# Patient Record
Sex: Male | Born: 1970
Health system: Southern US, Community
[De-identification: ages and names within clinical notes are randomized; demographics above are authoritative.]

## PROBLEM LIST (undated history)

## (undated) DIAGNOSIS — J45909 Unspecified asthma, uncomplicated: Secondary | ICD-10-CM

## (undated) DIAGNOSIS — H9190 Unspecified hearing loss, unspecified ear: Secondary | ICD-10-CM

## (undated) DIAGNOSIS — E119 Type 2 diabetes mellitus without complications: Secondary | ICD-10-CM

## (undated) DIAGNOSIS — G5600 Carpal tunnel syndrome, unspecified upper limb: Secondary | ICD-10-CM

## (undated) DIAGNOSIS — E559 Vitamin D deficiency, unspecified: Secondary | ICD-10-CM

## (undated) DIAGNOSIS — M542 Cervicalgia: Secondary | ICD-10-CM

## (undated) DIAGNOSIS — E785 Hyperlipidemia, unspecified: Secondary | ICD-10-CM

## (undated) DIAGNOSIS — S7292XA Unspecified fracture of left femur, initial encounter for closed fracture: Secondary | ICD-10-CM

## (undated) DIAGNOSIS — J309 Allergic rhinitis, unspecified: Secondary | ICD-10-CM

## (undated) DIAGNOSIS — E669 Obesity, unspecified: Secondary | ICD-10-CM

## (undated) DIAGNOSIS — R29898 Other symptoms and signs involving the musculoskeletal system: Secondary | ICD-10-CM

## (undated) DIAGNOSIS — E8881 Metabolic syndrome: Secondary | ICD-10-CM

## (undated) DIAGNOSIS — H918X9 Other specified hearing loss, unspecified ear: Secondary | ICD-10-CM

## (undated) DIAGNOSIS — M25519 Pain in unspecified shoulder: Secondary | ICD-10-CM

## (undated) HISTORY — DX: Type 2 diabetes mellitus without complications: E11.9

## (undated) HISTORY — DX: Other symptoms and signs involving the musculoskeletal system: R29.898

## (undated) HISTORY — DX: Other specified hearing loss, unspecified ear: H91.8X9

## (undated) HISTORY — DX: Unspecified hearing loss, unspecified ear: H91.90

## (undated) HISTORY — DX: Hyperlipidemia, unspecified: E78.5

## (undated) HISTORY — DX: Allergic rhinitis, unspecified: J30.9

## (undated) HISTORY — PX: BACK SURGERY: SHX140

## (undated) HISTORY — DX: Pain in unspecified shoulder: M25.519

## (undated) HISTORY — DX: Cervicalgia: M54.2

## (undated) HISTORY — DX: Unspecified asthma, uncomplicated: J45.909

## (undated) HISTORY — DX: Carpal tunnel syndrome, unspecified upper limb: G56.00

---

## 2000-04-27 HISTORY — PX: OTHER SURGICAL HISTORY: SHX169

## 2001-08-23 ENCOUNTER — Encounter: Admission: RE | Admit: 2001-08-23 | Discharge: 2001-08-23 | Payer: Self-pay | Admitting: Neurosurgery

## 2001-08-23 ENCOUNTER — Encounter: Payer: Self-pay | Admitting: Neurosurgery

## 2001-09-13 ENCOUNTER — Encounter: Payer: Self-pay | Admitting: Neurosurgery

## 2001-09-13 ENCOUNTER — Inpatient Hospital Stay (HOSPITAL_COMMUNITY): Admission: RE | Admit: 2001-09-13 | Discharge: 2001-09-15 | Payer: Self-pay | Admitting: Neurosurgery

## 2004-07-25 ENCOUNTER — Ambulatory Visit: Payer: Self-pay | Admitting: Internal Medicine

## 2004-08-01 ENCOUNTER — Ambulatory Visit: Payer: Self-pay | Admitting: Internal Medicine

## 2006-08-20 ENCOUNTER — Ambulatory Visit: Payer: Self-pay | Admitting: Internal Medicine

## 2006-08-20 LAB — CONVERTED CEMR LAB
ALT: 60 units/L — ABNORMAL HIGH (ref 0–53)
AST: 44 units/L — ABNORMAL HIGH (ref 0–37)
Albumin: 4.8 g/dL (ref 3.5–5.2)
Alkaline Phosphatase: 70 units/L (ref 39–117)
BUN: 10 mg/dL (ref 6–23)
Basophils Absolute: 0.1 10*3/uL (ref 0.0–0.1)
Basophils Relative: 1 % (ref 0–1)
Bilirubin, Direct: 0.1 mg/dL (ref 0.0–0.3)
CO2: 15 meq/L — ABNORMAL LOW (ref 19–32)
Calcium: 9.5 mg/dL (ref 8.4–10.5)
Chloride: 95 meq/L — ABNORMAL LOW (ref 96–112)
Cholesterol: 456 mg/dL — ABNORMAL HIGH (ref 0–200)
Creatinine, Ser: 1.62 mg/dL — ABNORMAL HIGH (ref 0.40–1.50)
Creatinine,U: 78.7 mg/dL
Eosinophils Absolute: 0.1 10*3/uL (ref 0.0–0.7)
Eosinophils Relative: 2 % (ref 0–5)
Glucose, Bld: 277 mg/dL — ABNORMAL HIGH (ref 70–99)
HCT: 42.4 % (ref 39.0–52.0)
HDL: 19 mg/dL — ABNORMAL LOW (ref >39)
Hemoglobin: 15.2 g/dL (ref 13.0–17.0)
Hgb A1c MFr Bld: 10.4 % — ABNORMAL HIGH (ref 4.6–6.1)
Indirect Bilirubin: 0.6 mg/dL (ref 0.0–0.9)
Lymphocytes Relative: 39 % (ref 12–46)
Lymphs Abs: 1.9 10*3/uL (ref 0.7–3.3)
MCHC: 35.8 g/dL (ref 30.0–36.0)
MCV: 88.1 fL (ref 78.0–100.0)
Microalb Creat Ratio: 54.6 mg/g — ABNORMAL HIGH (ref 0.0–30.0)
Microalb, Ur: 4.3 mg/dL — ABNORMAL HIGH (ref 0.0–1.9)
Monocytes Absolute: 0.6 10*3/uL (ref 0.2–0.7)
Monocytes Relative: 11 % (ref 3–11)
Neutro Abs: 2.3 10*3/uL (ref 1.7–7.7)
Neutrophils Relative %: 47 % (ref 43–77)
Platelets: 602 10*3/uL — ABNORMAL HIGH (ref 150–400)
Potassium: 4.3 meq/L (ref 3.5–5.3)
RBC: 4.81 M/uL (ref 4.22–5.81)
RDW: 19.4 % — ABNORMAL HIGH (ref 11.5–14.0)
Sodium: 132 meq/L — ABNORMAL LOW (ref 135–145)
TSH: 2.87 microintl units/mL (ref 0.35–5.50)
Total Bilirubin: 0.7 mg/dL (ref 0.3–1.2)
Total CHOL/HDL Ratio: 24
Total Protein: 8 g/dL (ref 6.0–8.3)
Triglycerides: 3582 mg/dL — ABNORMAL HIGH (ref <150)
WBC: 5 10*3/uL (ref 4.0–10.5)

## 2006-09-29 ENCOUNTER — Ambulatory Visit: Payer: Self-pay | Admitting: Internal Medicine

## 2006-09-29 LAB — CONVERTED CEMR LAB
BUN: 10 mg/dL (ref 6–23)
CO2: 30 meq/L (ref 19–32)
Calcium: 9.1 mg/dL (ref 8.4–10.5)
Chloride: 104 meq/L (ref 96–112)
Cholesterol: 232 mg/dL (ref 0–200)
Creatinine, Ser: 0.9 mg/dL (ref 0.4–1.5)
Direct LDL: 120.9 mg/dL
GFR calc Af Amer: 123 mL/min
GFR calc non Af Amer: 102 mL/min
Glucose, Bld: 147 mg/dL — ABNORMAL HIGH (ref 70–99)
HDL: 30.6 mg/dL — ABNORMAL LOW (ref >39.0)
Hgb A1c MFr Bld: 9.7 % — ABNORMAL HIGH (ref 4.6–6.0)
Potassium: 4 meq/L (ref 3.5–5.1)
Sodium: 140 meq/L (ref 135–145)
Total CHOL/HDL Ratio: 7.6
Triglycerides: 320 mg/dL (ref 0–149)
VLDL: 64 mg/dL — ABNORMAL HIGH (ref 0–40)

## 2006-10-18 ENCOUNTER — Encounter: Admission: RE | Admit: 2006-10-18 | Discharge: 2007-01-16 | Payer: Self-pay | Admitting: Physician Assistant

## 2007-11-21 ENCOUNTER — Ambulatory Visit: Payer: Self-pay | Admitting: Internal Medicine

## 2007-11-21 LAB — CONVERTED CEMR LAB
BUN: 9 mg/dL (ref 6–23)
CO2: 29 meq/L (ref 19–32)
Calcium: 9.3 mg/dL (ref 8.4–10.5)
Chloride: 103 meq/L (ref 96–112)
Cholesterol: 150 mg/dL (ref 0–200)
Creatinine, Ser: 0.9 mg/dL (ref 0.4–1.5)
GFR calc Af Amer: 122 mL/min
GFR calc non Af Amer: 101 mL/min
Glucose, Bld: 108 mg/dL — ABNORMAL HIGH (ref 70–99)
HDL: 27.9 mg/dL — ABNORMAL LOW (ref >39.0)
Hgb A1c MFr Bld: 6.1 % — ABNORMAL HIGH (ref 4.6–6.0)
LDL Cholesterol: 101 mg/dL — ABNORMAL HIGH (ref 0–99)
Potassium: 4.4 meq/L (ref 3.5–5.1)
Sodium: 140 meq/L (ref 135–145)
Total CHOL/HDL Ratio: 5.4
Triglycerides: 107 mg/dL (ref 0–149)
VLDL: 21 mg/dL (ref 0–40)

## 2007-11-25 ENCOUNTER — Encounter (INDEPENDENT_AMBULATORY_CARE_PROVIDER_SITE_OTHER): Payer: Self-pay | Admitting: *Deleted

## 2007-11-25 ENCOUNTER — Ambulatory Visit: Payer: Self-pay | Admitting: Internal Medicine

## 2007-11-25 DIAGNOSIS — E785 Hyperlipidemia, unspecified: Secondary | ICD-10-CM | POA: Insufficient documentation

## 2007-11-25 DIAGNOSIS — J309 Allergic rhinitis, unspecified: Secondary | ICD-10-CM | POA: Insufficient documentation

## 2007-11-25 DIAGNOSIS — G5600 Carpal tunnel syndrome, unspecified upper limb: Secondary | ICD-10-CM

## 2007-11-25 DIAGNOSIS — J45909 Unspecified asthma, uncomplicated: Secondary | ICD-10-CM | POA: Insufficient documentation

## 2007-11-25 DIAGNOSIS — R29898 Other symptoms and signs involving the musculoskeletal system: Secondary | ICD-10-CM

## 2007-11-25 DIAGNOSIS — E119 Type 2 diabetes mellitus without complications: Secondary | ICD-10-CM | POA: Insufficient documentation

## 2007-11-25 HISTORY — DX: Hyperlipidemia, unspecified: E78.5

## 2007-11-25 HISTORY — DX: Type 2 diabetes mellitus without complications: E11.9

## 2007-11-25 HISTORY — DX: Allergic rhinitis, unspecified: J30.9

## 2007-11-25 HISTORY — DX: Carpal tunnel syndrome, unspecified upper limb: G56.00

## 2007-11-25 HISTORY — DX: Unspecified asthma, uncomplicated: J45.909

## 2007-11-25 HISTORY — DX: Other symptoms and signs involving the musculoskeletal system: R29.898

## 2008-02-20 ENCOUNTER — Encounter: Admission: RE | Admit: 2008-02-20 | Discharge: 2008-02-20 | Payer: Self-pay | Admitting: Surgery

## 2008-04-18 ENCOUNTER — Ambulatory Visit: Payer: Self-pay | Admitting: Internal Medicine

## 2008-04-18 LAB — CONVERTED CEMR LAB
ALT: 52 units/L (ref 0–53)
AST: 28 units/L (ref 0–37)
Albumin: 4 g/dL (ref 3.5–5.2)
Alkaline Phosphatase: 48 units/L (ref 39–117)
BUN: 14 mg/dL (ref 6–23)
Basophils Absolute: 0 10*3/uL (ref 0.0–0.1)
Basophils Relative: 0.6 % (ref 0.0–3.0)
Bilirubin Urine: NEGATIVE
Bilirubin, Direct: 0.1 mg/dL (ref 0.0–0.3)
CO2: 29 meq/L (ref 19–32)
Calcium: 9 mg/dL (ref 8.4–10.5)
Chloride: 105 meq/L (ref 96–112)
Cholesterol: 192 mg/dL (ref 0–200)
Creatinine, Ser: 0.9 mg/dL (ref 0.4–1.5)
Creatinine,U: 134.1 mg/dL
Eosinophils Absolute: 0.1 10*3/uL (ref 0.0–0.7)
Eosinophils Relative: 2.1 % (ref 0.0–5.0)
GFR calc Af Amer: 122 mL/min
GFR calc non Af Amer: 101 mL/min
Glucose, Bld: 116 mg/dL — ABNORMAL HIGH (ref 70–99)
HCT: 41.4 % (ref 39.0–52.0)
HDL: 34.4 mg/dL — ABNORMAL LOW (ref >39.0)
Hemoglobin, Urine: NEGATIVE
Hemoglobin: 14.7 g/dL (ref 13.0–17.0)
Hgb A1c MFr Bld: 6.3 % — ABNORMAL HIGH (ref 4.6–6.0)
Ketones, ur: NEGATIVE mg/dL
LDL Cholesterol: 136 mg/dL — ABNORMAL HIGH (ref 0–99)
Leukocytes, UA: NEGATIVE
Lymphocytes Relative: 37.3 % (ref 12.0–46.0)
MCHC: 35.5 g/dL (ref 30.0–36.0)
MCV: 85.4 fL (ref 78.0–100.0)
Microalb Creat Ratio: 6 mg/g (ref 0.0–30.0)
Microalb, Ur: 0.8 mg/dL (ref 0.0–1.9)
Monocytes Absolute: 0.5 10*3/uL (ref 0.1–1.0)
Monocytes Relative: 8.8 % (ref 3.0–12.0)
Neutro Abs: 2.8 10*3/uL (ref 1.4–7.7)
Neutrophils Relative %: 51.2 % (ref 43.0–77.0)
Nitrite: NEGATIVE
Platelets: 213 10*3/uL (ref 150–400)
Potassium: 4.2 meq/L (ref 3.5–5.1)
RBC: 4.84 M/uL (ref 4.22–5.81)
RDW: 13.4 % (ref 11.5–14.6)
Sodium: 139 meq/L (ref 135–145)
Specific Gravity, Urine: 1.02 (ref 1.000–1.03)
TSH: 2.38 microintl units/mL (ref 0.35–5.50)
Total Bilirubin: 0.8 mg/dL (ref 0.3–1.2)
Total CHOL/HDL Ratio: 5.6
Total Protein, Urine: NEGATIVE mg/dL
Total Protein: 7.4 g/dL (ref 6.0–8.3)
Triglycerides: 108 mg/dL (ref 0–149)
Urine Glucose: NEGATIVE mg/dL
Urobilinogen, UA: 0.2 (ref 0.0–1.0)
VLDL: 22 mg/dL (ref 0–40)
WBC: 5.4 10*3/uL (ref 4.5–10.5)
pH: 6 (ref 5.0–8.0)

## 2008-04-25 ENCOUNTER — Ambulatory Visit: Payer: Self-pay | Admitting: Internal Medicine

## 2008-04-25 DIAGNOSIS — H9190 Unspecified hearing loss, unspecified ear: Secondary | ICD-10-CM | POA: Insufficient documentation

## 2008-04-25 HISTORY — DX: Unspecified hearing loss, unspecified ear: H91.90

## 2008-10-17 ENCOUNTER — Ambulatory Visit: Payer: Self-pay | Admitting: Internal Medicine

## 2008-10-17 LAB — CONVERTED CEMR LAB
CO2: 30 meq/L (ref 19–32)
Calcium: 9.1 mg/dL (ref 8.4–10.5)
Creatinine, Ser: 0.9 mg/dL (ref 0.4–1.5)
Total CHOL/HDL Ratio: 4
Triglycerides: 130 mg/dL (ref 0.0–149.0)

## 2008-10-25 ENCOUNTER — Ambulatory Visit: Payer: Self-pay | Admitting: Internal Medicine

## 2008-10-25 DIAGNOSIS — I10 Essential (primary) hypertension: Secondary | ICD-10-CM

## 2009-03-26 ENCOUNTER — Telehealth: Payer: Self-pay | Admitting: Internal Medicine

## 2009-05-09 ENCOUNTER — Ambulatory Visit: Payer: Self-pay | Admitting: Internal Medicine

## 2009-05-09 LAB — CONVERTED CEMR LAB
ALT: 79 units/L — ABNORMAL HIGH (ref 0–53)
Albumin: 4.1 g/dL (ref 3.5–5.2)
BUN: 10 mg/dL (ref 6–23)
Basophils Relative: 0.8 % (ref 0.0–3.0)
Chloride: 99 meq/L (ref 96–112)
Cholesterol: 150 mg/dL (ref 0–200)
Creatinine,U: 186 mg/dL
Eosinophils Relative: 1.8 % (ref 0.0–5.0)
HCT: 41.8 % (ref 39.0–52.0)
Hemoglobin: 13.9 g/dL (ref 13.0–17.0)
Hgb A1c MFr Bld: 6.8 % — ABNORMAL HIGH (ref 4.6–6.5)
Lymphs Abs: 1.8 10*3/uL (ref 0.7–4.0)
MCV: 87.4 fL (ref 78.0–100.0)
Microalb, Ur: 0.5 mg/dL (ref 0.0–1.9)
Monocytes Absolute: 0.3 10*3/uL (ref 0.1–1.0)
Neutro Abs: 1.8 10*3/uL (ref 1.4–7.7)
Nitrite: NEGATIVE
Platelets: 215 10*3/uL (ref 150.0–400.0)
Potassium: 4.3 meq/L (ref 3.5–5.1)
RBC: 4.79 M/uL (ref 4.22–5.81)
Specific Gravity, Urine: 1.02 (ref 1.000–1.030)
TSH: 2.04 microintl units/mL (ref 0.35–5.50)
Total Protein, Urine: NEGATIVE mg/dL
Total Protein: 7.3 g/dL (ref 6.0–8.3)
Urine Glucose: NEGATIVE mg/dL
WBC: 4 10*3/uL — ABNORMAL LOW (ref 4.5–10.5)

## 2009-05-10 ENCOUNTER — Ambulatory Visit: Payer: Self-pay | Admitting: Internal Medicine

## 2009-05-10 DIAGNOSIS — H918X9 Other specified hearing loss, unspecified ear: Secondary | ICD-10-CM

## 2009-05-10 HISTORY — DX: Other specified hearing loss, unspecified ear: H91.8X9

## 2009-07-26 ENCOUNTER — Telehealth: Payer: Self-pay | Admitting: Internal Medicine

## 2009-08-27 ENCOUNTER — Ambulatory Visit: Payer: Self-pay | Admitting: Internal Medicine

## 2009-08-27 DIAGNOSIS — M25519 Pain in unspecified shoulder: Secondary | ICD-10-CM

## 2009-08-27 DIAGNOSIS — M542 Cervicalgia: Secondary | ICD-10-CM

## 2009-08-27 HISTORY — DX: Pain in unspecified shoulder: M25.519

## 2009-08-27 HISTORY — DX: Cervicalgia: M54.2

## 2009-11-13 ENCOUNTER — Ambulatory Visit: Payer: Self-pay | Admitting: Internal Medicine

## 2009-11-13 LAB — CONVERTED CEMR LAB
Calcium: 9.1 mg/dL (ref 8.4–10.5)
Creatinine, Ser: 0.8 mg/dL (ref 0.4–1.5)
Direct LDL: 75.3 mg/dL
GFR calc non Af Amer: 138.41 mL/min (ref >60)
Glucose, Bld: 306 mg/dL — ABNORMAL HIGH (ref 70–99)
HDL: 31.7 mg/dL — ABNORMAL LOW (ref >39.00)
Sodium: 133 meq/L — ABNORMAL LOW (ref 135–145)

## 2009-11-14 ENCOUNTER — Ambulatory Visit: Payer: Self-pay | Admitting: Internal Medicine

## 2009-12-23 ENCOUNTER — Telehealth (INDEPENDENT_AMBULATORY_CARE_PROVIDER_SITE_OTHER): Payer: Self-pay | Admitting: *Deleted

## 2010-03-07 ENCOUNTER — Telehealth (INDEPENDENT_AMBULATORY_CARE_PROVIDER_SITE_OTHER): Payer: Self-pay | Admitting: *Deleted

## 2010-05-15 ENCOUNTER — Ambulatory Visit: Admit: 2010-05-15 | Payer: Self-pay | Admitting: Internal Medicine

## 2010-05-22 ENCOUNTER — Ambulatory Visit: Admit: 2010-05-22 | Payer: Self-pay | Admitting: Internal Medicine

## 2010-05-27 NOTE — Progress Notes (Signed)
  Phone Note Other Incoming   Request: Send information Summary of Call: Request for records received from [employer]. Request forwarded to [facility].     

## 2010-05-27 NOTE — Assessment & Plan Note (Signed)
Summary: 6 mos f/u #/cd   Vital Signs:  Patient profile:   40 year old male Height:      72 inches Weight:      258.50 pounds BMI:     35.19 O2 Sat:      94 % on Room air Temp:     99.2 degrees F oral Pulse rate:   95 / minute BP sitting:   100 / 70  (left arm) Cuff size:   large  Vitals Entered By: Zella Ball Ewing CMA Duncan Dull) (November 14, 2009 8:31 AM)  O2 Flow:  Room air CC: 6 month followup/RE   CC:  6 month followup/RE.  History of Present Illness: here to f/u - has not been taking the actos due to simple forgetting and higher cost , and only taking 2 of the metformin as the a1c was so good last visit;  Pt denies CP, sob, doe, wheezing, orthopnea, pnd, worsening LE edema, palps, dizziness or syncope  Pt denies new neuro symptoms such as headache, facial or extremity weakness  Pt denies polydipsia, polyuria, or low sugar symptoms such as shakiness improved with eating.  Overall good compliance with meds, trying to follow low chol, DM diet, wt stable, little excercise however  No futher neck pain , no UE or LE symtpom or pain, weak, numb, gait change or falls  Preventive Screening-Counseling & Management      Drug Use:  no.    Problems Prior to Update: 1)  Neck Pain  (ICD-723.1) 2)  Shoulder Pain, Left  (ICD-719.41) 3)  Other Specified Forms of Hearing Loss  (ICD-389.8) 4)  Hypertension  (ICD-401.9) 5)  Unspecified Hearing Loss  (ICD-389.9) 6)  Preventive Health Care  (ICD-V70.0) 7)  Allergic Rhinitis  (ICD-477.9) 8)  Asthma  (ICD-493.90) 9)  Other Symptoms Referable To Lower Leg Joint  (ICD-719.66) 10)  Hyperlipidemia  (ICD-272.4) 11)  Diabetes Mellitus, Type II  (ICD-250.00) 12)  Carpal Tunnel Syndrome, Left, Mild  (ICD-354.0)  Medications Prior to Update: 1)  Metformin Hcl 500 Mg  Xr24h-Tab (Metformin Hcl) .... Take 4 Tablet By Mouth Once A Day 2)  Actos 30 Mg  Tabs (Pioglitazone Hcl) .... Take 1 Tablet By Mouth Once A Day 3)  Bayer Low Strength 81 Mg  Tbec (Aspirin) ....  Take 1 Tablet By Mouth Once A Day 4)  Pravachol 40 Mg  Tabs (Pravastatin Sodium) .Marland Kitchen.. 1 By Mouth Once Daily 5)  Niaspan 500 Mg Cr-Tabs (Niacin (Antihyperlipidemic)) .Marland Kitchen.. 1 By Mouth Once Daily 6)  Lisinopril 5 Mg Tabs (Lisinopril) .Marland Kitchen.. 1po Once Daily 7)  Tylenol With Codeine #3 300-30 Mg Tabs (Acetaminophen-Codeine) .Marland Kitchen.. 1 By Mouth Q 6 Hrs As Needed Pain 8)  Flexeril 5 Mg Tabs (Cyclobenzaprine Hcl) .Marland Kitchen.. 1 By Mouth Three Times A Day As Needed 9)  Prednisone 10 Mg Tabs (Prednisone) .... 3po Qd For 3days, Then 2po Qd For 3days, Then 1po Qd For 3days, Then Stop  Current Medications (verified): 1)  Bayer Low Strength 81 Mg  Tbec (Aspirin) .... Take 1 Tablet By Mouth Once A Day 2)  Pravachol 40 Mg  Tabs (Pravastatin Sodium) .Marland Kitchen.. 1 By Mouth Once Daily 3)  Niaspan 500 Mg Cr-Tabs (Niacin (Antihyperlipidemic)) .Marland Kitchen.. 1 By Mouth Once Daily 4)  Lisinopril 5 Mg Tabs (Lisinopril) .Marland Kitchen.. 1po Once Daily 5)  Kombiglyze Xr 08-998 Mg Xr24h-Tab (Saxagliptin-Metformin) .Marland Kitchen.. 1 By Mouth Qam  Allergies (verified): 1)  ! 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   Head:  normocephalic and atraumatic.   Eyes:  vision grossly intact, pupils equal, and pupils round.   Ears:  R ear normal  and L ear normal.   Nose:  no external deformity and no nasal discharge.   Mouth:  no gingival abnormalities and pharynx pink and moist.   Neck:  supple and no masses.   Lungs:  normal respiratory effort and normal breath sounds.   Heart:  normal rate and regular rhythm.   Extremities:  no edema, no erythema    Impression & Recommendations:  Problem # 1:  DIABETES MELLITUS, TYPE II (ICD-250.00)  The following medications were removed from the medication list:    Metformin Hcl 500 Mg Xr24h-tab (Metformin hcl) .Marland Kitchen... Take 4 tablet by mouth once a day    Actos 30 Mg Tabs (Pioglitazone hcl) .Marland Kitchen... Take 1 tablet by mouth once a day His updated medication list for this problem includes:    Bayer Low Strength 81 Mg Tbec (Aspirin) .Marland Kitchen... Take 1 tablet by mouth once a day    Lisinopril 5 Mg Tabs (Lisinopril) .Marland Kitchen... 1po once daily    Kombiglyze Xr 08-998 Mg Xr24h-tab (Saxagliptin-metformin) .Marland Kitchen... 1 by mouth qam with some noncompliacne with meds - to make simpler and cheaper will change to above iwth $10/mo copay  Labs Reviewed: Creat: 0.8 (11/13/2009)    Reviewed HgBA1c results: 9.3 (11/13/2009)  6.8 (05/09/2009)  Problem # 2:  HYPERLIPIDEMIA (ICD-272.4)  His updated medication list for this problem includes:    Pravachol 40 Mg Tabs (Pravastatin sodium) .Marland Kitchen... 1 by mouth once daily    Niaspan 500 Mg Cr-tabs (Niacin (antihyperlipidemic)) .Marland Kitchen... 1 by mouth once daily  Labs Reviewed: SGOT: 49 (05/09/2009)   SGPT: 79 (05/09/2009)   HDL:31.70 (11/13/2009), 31.30 (05/09/2009)  LDL:91 (05/09/2009), 84 (10/17/2008)  Chol:160 (11/13/2009), 150 (05/09/2009)  Trig:359.0 (11/13/2009), 137.0 (05/09/2009) stable overall by hx and exam, ok to continue meds/tx as is   Problem # 3:  NECK PAIN (ICD-723.1)  The following medications were removed from the medication list:    Tylenol With Codeine #3 300-30 Mg Tabs (Acetaminophen-codeine) .Marland Kitchen... 1 by mouth q 6 hrs as needed pain    Flexeril 5 Mg Tabs  (Cyclobenzaprine hcl) .Marland Kitchen... 1 by mouth three times a day as needed His updated medication list for this problem includes:    Bayer Low Strength 81 Mg Tbec (Aspirin) .Marland Kitchen... Take 1 tablet by mouth once a day resolved - to d/c med  Problem # 4:  HYPERTENSION (ICD-401.9)  His updated medication list for this problem includes:    Lisinopril 5 Mg Tabs (Lisinopril) .Marland Kitchen... 1po once daily  BP today: 100/70 Prior BP: 112/82 (08/27/2009)  Labs Reviewed: K+: 4.2 (11/13/2009) Creat: : 0.8 (11/13/2009)   Chol: 160 (11/13/2009)   HDL: 31.70 (11/13/2009)   LDL: 91 (05/09/2009)   TG: 359.0 (11/13/2009)  stable overall by hx and exam, ok to continue meds/tx as is   Complete Medication List: 1)  Bayer Low Strength 81 Mg Tbec (Aspirin) .... Take 1 tablet by mouth once a day 2)  Pravachol 40 Mg Tabs (Pravastatin sodium) .Marland Kitchen.. 1 by mouth once daily 3)  Niaspan 500 Mg Cr-tabs (Niacin (antihyperlipidemic)) .Marland Kitchen.. 1 by mouth once daily 4)  Lisinopril 5 Mg Tabs (Lisinopril) .Marland Kitchen.. 1po once daily 5)  Kombiglyze Xr 08-998 Mg Xr24h-tab (Saxagliptin-metformin) .Marland Kitchen.. 1 by mouth qam  Patient Instructions: 1)  please finish the actos and metformin that you have 2)  after that, start the Komblygize at the one pill in the AM - at the 08/998 strength (AFTER you activate the card and use the prescription at the local pharmacy to get the $10/mo price) 3)  Continue all previous medications as before this visit  4)  Please schedule a follow-up appointment in 6 months with CPX labs and: 5)  HbgA1C prior to visit, ICD-9: 250.02 6)  Urine Microalbumin prior to visit, ICD-9: 7)  Please call when you are taking the Komblygize and blood sugars seem to stay over 200 Prescriptions: KOMBIGLYZE XR 08-998 MG XR24H-TAB (SAXAGLIPTIN-METFORMIN) 1 by mouth qam  #30 x 11   Entered and Authorized by:   Corwin Levins MD   Signed by:   Corwin Levins MD on 11/14/2009   Method used:   Print then Give to Patient   RxID:   [id number] LISINOPRIL  5 MG TABS (LISINOPRIL) 1po once daily  #90 x 3   Entered and Authorized by:   Corwin Levins MD   Signed by:   Corwin Levins MD on 11/14/2009   Method used:   Print then Give to Patient   RxID:   [id number] NIASPAN 500 MG CR-TABS (NIACIN (ANTIHYPERLIPIDEMIC)) 1 by mouth once daily  #90 x 3   Entered and Authorized by:   Corwin Levins MD   Signed by:   Corwin Levins MD on 11/14/2009   Method used:   Print then Give to Patient   RxID:   [id number] PRAVACHOL 40 MG  TABS (PRAVASTATIN SODIUM) 1 by mouth once daily  #90 x 3   Entered and Authorized by:   Corwin Levins MD   Signed by:   Corwin Levins MD on 11/14/2009   Method used:   Print then Give to Patient   RxID:   [id number]

## 2010-05-27 NOTE — Progress Notes (Signed)
  Phone Note Other Incoming   Request: Send information Summary of Call: Request for records received from [employer] [facility]. Request forwarded to [facility].

## 2010-05-27 NOTE — Progress Notes (Signed)
  Phone Note Refill Request  on July 26, 2009 8:47 AM  Refills Requested: Medication #1:  ACTOS 30 MG  TABS Take 1 tablet by mouth once a day   Dosage confirmed as above?Dosage Confirmed   Notes: [facility] Initial call taken by: Scharlene Gloss,  July 26, 2009 8:48 AM    Prescriptions: PRAVACHOL 40 MG  TABS (PRAVASTATIN SODIUM) 1 by mouth once daily  #90 x 3   Entered by:   Scharlene Gloss   Authorized by:   Corwin Levins MD   Signed by:   Scharlene Gloss on 07/26/2009   Method used:   Faxed to ...       [facility]* (mail-order)             ,          Ph: [phone]       Fax: [phone]   RxID:   [id number] ACTOS 30 MG  TABS (PIOGLITAZONE HCL) Take 1 tablet by mouth once a day  #90 x 3   Entered by:   Scharlene Gloss   Authorized by:   Corwin Levins MD   Signed by:   Scharlene Gloss on 07/26/2009   Method used:   Faxed to ...       [facility]* (mail-order)             ,          Ph: [phone]       Fax: [phone]   RxID:   [id number]

## 2010-05-27 NOTE — Assessment & Plan Note (Signed)
Summary: 6 MO FU/$50/PN   Vital Signs:  Patient profile:   40 year old male Height:      72 inches Weight:      263 pounds BMI:     35.80 O2 Sat:      97 % on Room air Temp:     97.6 degrees F oral Pulse rate:   84 / minute BP sitting:   112 / 68  (left arm) Cuff size:   large  Vitals Entered ByZella Ball Ewing (May 10, 2009 8:36 AM)  O2 Flow:  Room air  CC: 6 Mo ROV/RE   CC:  6 Mo ROV/RE.  History of Present Illness: admit to some dieatry non complaince with some wt gain, but now going to the gym 4 times per wk; Pt denies CP, sob, doe, wheezing, orthopnea, pnd, worsening LE edema, palps, dizziness or syncope  Pt denies new neuro symptoms such as headache, facial or extremity weakness   Pt denies polydipsia, polyuria, or low sugar symptoms such as shakiness improved with eating.  Overall good compliance with meds, trying to follow low chol, DM diet, wt stable, little excercise however   Problems Prior to Update: 1)  Hypertension  (ICD-401.9) 2)  Unspecified Hearing Loss  (ICD-389.9) 3)  Preventive Health Care  (ICD-V70.0) 4)  Allergic Rhinitis  (ICD-477.9) 5)  Asthma  (ICD-493.90) 6)  Other Symptoms Referable To Lower Leg Joint  (ICD-719.66) 7)  Hyperlipidemia  (ICD-272.4) 8)  Diabetes Mellitus, Type II  (ICD-250.00) 9)  Carpal Tunnel Syndrome, Left, Mild  (ICD-354.0)  Medications Prior to Update: 1)  Metformin Hcl 500 Mg  Xr24h-Tab (Metformin Hcl) .... Take 4 Tablet By Mouth Once A Day 2)  Actos 30 Mg  Tabs (Pioglitazone Hcl) .... Take 1 Tablet By Mouth Once A Day 3)  Bayer Low Strength 81 Mg  Tbec (Aspirin) .... Take 1 Tablet By Mouth Once A Day 4)  Pravachol 40 Mg  Tabs (Pravastatin Sodium) .Marland Kitchen.. 1 By Mouth Once Daily 5)  Niaspan 500 Mg Cr-Tabs (Niacin (Antihyperlipidemic)) .Marland Kitchen.. 1 By Mouth Once Daily 6)  Lisinopril 5 Mg Tabs (Lisinopril) .Marland Kitchen.. 1po Once Daily  Current Medications (verified): 1)  Metformin Hcl 500 Mg  Xr24h-Tab (Metformin Hcl) .... Take 4 Tablet By  Mouth Once A Day 2)  Actos 30 Mg  Tabs (Pioglitazone Hcl) .... Take 1 Tablet By Mouth Once A Day 3)  Bayer Low Strength 81 Mg  Tbec (Aspirin) .... Take 1 Tablet By Mouth Once A Day 4)  Pravachol 40 Mg  Tabs (Pravastatin Sodium) .Marland Kitchen.. 1 By Mouth Once Daily 5)  Niaspan 500 Mg Cr-Tabs (Niacin (Antihyperlipidemic)) .Marland Kitchen.. 1 By Mouth Once Daily 6)  Lisinopril 5 Mg Tabs (Lisinopril) .Marland Kitchen.. 1po Once Daily  Allergies (verified): 1)  ! 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         all otherwise negative per pt , except has some bilat hearing loss with wax impacted   Impression & Recommendations:  Problem # 1:  Preventive Health Care (ICD-V70.0) Overall doing well, updated the age appropriate counseling and education;  routine health screening/prevention reviewed and done as appropriate unless declined, immunizations up to date or declined, diet counseling done if overweight, urged to quit smoking if smokes , most recent labs reviewed and current ordered if appropriate, ecg  reviewed or declined ; for flu shot today  Problem # 2:  OTHER SPECIFIED FORMS OF HEARING LOSS (ICD-389.8) due to wax - improved with irrigation  Complete Medication List: 1)  Metformin Hcl 500 Mg Xr24h-tab (Metformin hcl) .... Take 4 tablet by mouth once a day 2)  Actos 30 Mg Tabs (Pioglitazone hcl) .... Take 1 tablet by mouth once a day 3)  Bayer Low Strength 81 Mg Tbec (Aspirin) .... Take 1 tablet by mouth once a day 4)  Pravachol 40 Mg Tabs (Pravastatin sodium) .Marland Kitchen.. 1 by mouth once daily 5)  Niaspan 500 Mg Cr-tabs (Niacin (antihyperlipidemic)) .Marland Kitchen.. 1 by mouth once daily 6)  Lisinopril 5 Mg Tabs (Lisinopril) .Marland Kitchen.. 1po once daily  Patient Instructions: 1)  you had the flu shot today 2)  your ears were irrigated today 3)  Continue all previous medications as before this visit  4)  Please schedule a follow-up appointment in 6 months wtih: 5)  BMP prior to visit, ICD-9: 250.02 6)  Lipid Panel prior to visit, ICD-9: 7)  HbgA1C prior to visit, ICD-9:  Appended Document: Immunization Entry            Flu Vaccine Consent Questions     Do you have a history of severe allergic reactions to this vaccine? no    Any prior history of allergic reactions to egg and/or gelatin? no    Do you have a sensitivity to the preservative Thimersol? no    Do you have a past history of Guillan-Barre Syndrome? no    Do you currently have an acute febrile illness? no    Have you ever had a severe reaction to latex? no    Vaccine information given and explained to patient? yes    Are you currently pregnant? no    Lot Number:[id number]   Exp Date:10/24/2009   Site Given  Left Deltoid IM

## 2010-05-27 NOTE — Assessment & Plan Note (Signed)
Summary: neck, arm pain/cd   Vital Signs:  Patient profile:   40 year old male Height:      72 inches Weight:      270.25 pounds BMI:     36.78 O2 Sat:      94 % on Room air Temp:     98.4 degrees F oral Pulse rate:   85 / minute BP sitting:   112 / 82  (left arm) Cuff size:   large  Vitals Entered ByZella Ball Ewing (Aug 27, 2009 11:25 AM)  O2 Flow:  Room air CC: Neck and arm pain for 1 week/RE   CC:  Neck and arm pain for 1 week/RE.  History of Present Illness: here today with left shoulder and neck pain;  with the pain to shoulder ongoing for approx one month, then more recetnly the left neck pain for about one wk;  pain overall 4/10, worse to lift the arm or lie on the side, nothing makes it better and described as constant ache type pain, left neck is more recent as above, milder, and slight tender to the area to the left lat base of the neck, but not clearly assoc with radicular pain, or LUE pain, weakness, numbness.; is worse to turn the head horizontally to the right;  no back pain, bowel or bladder change, fever, night sweats or wt loss; although he has some intermittent mild tingling to the left lateral leg for a few days.  Pt denies CP, sob, doe, wheezing, orthopnea, pnd, worsening LE edema, palps, dizziness or syncope   Pt denies other new neuro symptoms such as headache, facial or extremity weakness .  Pt denies polydipsia, polyuria, or low sugar symptoms such as shakiness improved with eating.  Overall good compliance with meds, trying to follow low chol, DM diet, wt stable, little excercise however   Problems Prior to Update: 1)  Neck Pain  (ICD-723.1) 2)  Shoulder Pain, Left  (ICD-719.41) 3)  Other Specified Forms of Hearing Loss  (ICD-389.8) 4)  Hypertension  (ICD-401.9) 5)  Unspecified Hearing Loss  (ICD-389.9) 6)  Preventive Health Care  (ICD-V70.0) 7)  Allergic Rhinitis  (ICD-477.9) 8)  Asthma  (ICD-493.90) 9)  Other Symptoms Referable To Lower Leg Joint   (ICD-719.66) 10)  Hyperlipidemia  (ICD-272.4) 11)  Diabetes Mellitus, Type II  (ICD-250.00) 12)  Carpal Tunnel Syndrome, Left, Mild  (ICD-354.0)  Medications Prior to Update: 1)  Metformin Hcl 500 Mg  Xr24h-Tab (Metformin Hcl) .... Take 4 Tablet By Mouth Once A Day 2)  Actos 30 Mg  Tabs (Pioglitazone Hcl) .... Take 1 Tablet By Mouth Once A Day 3)  Bayer Low Strength 81 Mg  Tbec (Aspirin) .... Take 1 Tablet By Mouth Once A Day 4)  Pravachol 40 Mg  Tabs (Pravastatin Sodium) .Marland Kitchen.. 1 By Mouth Once Daily 5)  Niaspan 500 Mg Cr-Tabs (Niacin (Antihyperlipidemic)) .Marland Kitchen.. 1 By Mouth Once Daily 6)  Lisinopril 5 Mg Tabs (Lisinopril) .Marland Kitchen.. 1po Once Daily  Current Medications (verified): 1)  Metformin Hcl 500 Mg  Xr24h-Tab (Metformin Hcl) .... Take 4 Tablet By Mouth Once A Day 2)  Actos 30 Mg  Tabs (Pioglitazone Hcl) .... Take 1 Tablet By Mouth Once A Day 3)  Bayer Low Strength 81 Mg  Tbec (Aspirin) .... Take 1 Tablet By Mouth Once A Day 4)  Pravachol 40 Mg  Tabs (Pravastatin Sodium) .Marland Kitchen.. 1 By Mouth Once Daily 5)  Niaspan 500 Mg Cr-Tabs (Niacin (Antihyperlipidemic)) .Marland Kitchen.. 1 By Mouth Once  Daily 6)  Lisinopril 5 Mg Tabs (Lisinopril) .Marland Kitchen.. 1po Once Daily 7)  Tylenol With Codeine #3 300-30 Mg Tabs (Acetaminophen-Codeine) .Marland Kitchen.. 1 By Mouth Q 6 Hrs As Needed Pain 8)  Flexeril 5 Mg Tabs (Cyclobenzaprine Hcl) .Marland Kitchen.. 1 By Mouth Three Times A Day As Needed 9)  Prednisone 10 Mg Tabs (Prednisone) .... 3po Qd For 3days, Then 2po Qd For 3days, Then 1po Qd For 3days, Then Stop  Allergies (verified): 1)  ! 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   Head:  normocephalic and atraumatic.   Eyes:  vision grossly intact, pupils equal, and pupils round.   Ears:  R ear normal and L ear normal.   Nose:  no external deformity and no nasal discharge.   Mouth:  no gingival abnormalities and pharynx pink and moist.   Neck:  supple and no masses.   Lungs:  normal respiratory effort and normal breath sounds.   Heart:  normal rate and regular rhythm.   Msk:  mild tender to left lateral base of neck without mass, swelling or rash;   left shoulder tender to the subachromical bursa area, as well as the rot cuff area, pain worse to abduct and forward elevate although has FROM Extremities:  no edema, no erythema  Neurologic:  cranial nerves II-XII intact, strength normal in all extremities, sensation intact to light touch, gait normal, and DTRs symmetrical and normal.     Impression & Recommendations:  Problem # 1:  SHOULDER PAIN, LEFT (ICD-719.41)  His updated medication list for this problem includes:    Bayer Low Strength 81 Mg Tbec (Aspirin) .Marland Kitchen... Take 1 tablet by mouth once a day    Tylenol With Codeine #3 300-30 Mg Tabs (Acetaminophen-codeine) .Marland Kitchen... 1 by mouth q 6 hrs as needed pain    Flexeril 5 Mg Tabs (Cyclobenzaprine hcl) .Marland Kitchen... 1 by mouth three times a day as needed clinically c/w bursitis/rot cuff tendonitits ; will check film but does not want to see ortho as only mild for now;  to avoid further wt lifting for now, tx with pain med, muscle relaxer as needed and prednisone burst and taper off; pt to cal for ortho eval if does not improve  Orders: T-Shoulder Left Min 2 Views (73030TC) T-Cervical Spine Comp 4 Views [phone])  Problem # 2:  NECK PAIN (ICD-723.1)  His updated medication list for this problem includes:    Bayer Low Strength 81 Mg Tbec (Aspirin) .Marland Kitchen... Take 1 tablet by mouth  once a day    Tylenol With Codeine #3 300-30 Mg Tabs (Acetaminophen-codeine) .Marland Kitchen... 1 by mouth q 6 hrs as needed pain    Flexeril 5 Mg Tabs (Cyclobenzaprine hcl) .Marland Kitchen... 1 by mouth three times a day as needed left lateal base - exam benign, suspect msk strain;  ok for treat as above, f/u any worsening signs or symptoms   Problem # 3:  DIABETES MELLITUS, TYPE II (ICD-250.00)  His updated medication list for this problem includes:  Metformin Hcl 500 Mg Xr24h-tab (Metformin hcl) .Marland Kitchen... Take 4 tablet by mouth once a day    Actos 30 Mg Tabs (Pioglitazone hcl) .Marland Kitchen... Take 1 tablet by mouth once a day    Bayer Low Strength 81 Mg Tbec (Aspirin) .Marland Kitchen... Take 1 tablet by mouth once a day    Lisinopril 5 Mg Tabs (Lisinopril) .Marland Kitchen... 1po once daily  Labs Reviewed: Creat: 0.9 (05/09/2009)    Reviewed HgBA1c results: 6.8 (05/09/2009)  6.1 (10/17/2008) stable overall by hx and exam, ok to continue meds/tx as is , asked pt to follow sugars more closely at home on the prednisone, as he can expect a slight elevation, to call or f/u with cbg's > 200  Complete Medication List: 1)  Metformin Hcl 500 Mg Xr24h-tab (Metformin hcl) .... Take 4 tablet by mouth once a day 2)  Actos 30 Mg Tabs (Pioglitazone hcl) .... Take 1 tablet by mouth once a day 3)  Bayer Low Strength 81 Mg Tbec (Aspirin) .... Take 1 tablet by mouth once a day 4)  Pravachol 40 Mg Tabs (Pravastatin sodium) .Marland Kitchen.. 1 by mouth once daily 5)  Niaspan 500 Mg Cr-tabs (Niacin (antihyperlipidemic)) .Marland Kitchen.. 1 by mouth once daily 6)  Lisinopril 5 Mg Tabs (Lisinopril) .Marland Kitchen.. 1po once daily 7)  Tylenol With Codeine #3 300-30 Mg Tabs (Acetaminophen-codeine) .Marland Kitchen.. 1 by mouth q 6 hrs as needed pain 8)  Flexeril 5 Mg Tabs (Cyclobenzaprine hcl) .Marland Kitchen.. 1 by mouth three times a day as needed 9)  Prednisone 10 Mg Tabs (Prednisone) .... 3po qd for 3days, then 2po qd for 3days, then 1po qd for 3days, then stop  Patient Instructions: 1)  Please take all new medications as  prescribed 2)  Continue all previous medications as before this visit  3)  Please go to Radiology in the basement level for your X-Ray today  4)  Please call in 1-2 wks for orthoepedic referral if not improved 5)  Please keep your appt in July as planned Prescriptions: PREDNISONE 10 MG TABS (PREDNISONE) 3po qd for 3days, then 2po qd for 3days, then 1po qd for 3days, then stop  #18 x 0   Entered and Authorized by:   Corwin Levins MD   Signed by:   Corwin Levins MD on 08/27/2009   Method used:   Print then Give to Patient   RxID:   [id number] FLEXERIL 5 MG TABS (CYCLOBENZAPRINE HCL) 1 by mouth three times a day as needed  #40 x 1   Entered and Authorized by:   Corwin Levins MD   Signed by:   Corwin Levins MD on 08/27/2009   Method used:   Print then Give to Patient   RxID:   [id number] TYLENOL WITH CODEINE #3 300-30 MG TABS (ACETAMINOPHEN-CODEINE) 1 by mouth q 6 hrs as needed pain  #40 x 1   Entered and Authorized by:   Corwin Levins MD   Signed by:   Corwin Levins MD on 08/27/2009   Method used:   Print then Give to Patient   RxID:   [id number]

## 2010-08-28 ENCOUNTER — Other Ambulatory Visit: Payer: Self-pay | Admitting: Internal Medicine

## 2010-09-05 ENCOUNTER — Other Ambulatory Visit: Payer: Self-pay | Admitting: Internal Medicine

## 2010-09-11 ENCOUNTER — Other Ambulatory Visit: Payer: Self-pay | Admitting: Internal Medicine

## 2010-09-12 NOTE — Op Note (Signed)
[place]. [facility]  Patient:    Dylan [name], Dylan [name] Visit Number: [id number] MRN: [id number]          Service Type: SUR Location: 3000 3022 01 Attending Physician:  Danella Penton Dictated by:   Tanya Nones. Jeral Fruit, M.D. Proc. Date: 09/13/01 Admit Date:  09/13/2001   CC:         Ronnald Nian, M.D.   Operative Report  PREOPERATIVE DIAGNOSIS:  Left L5-S1 herniated disk with a free fragment.  POSTOPERATIVE DIAGNOSIS:  Left L5-S1 herniated disk with a free fragment.  PROCEDURE:  Left L5-S1 diskectomy with a paramedian incision, ______ microscope.  SURGEON:  Tanya Nones. Jeral Fruit, M.D.  ASSISTANT:  Danae Orleans. Venetia Maxon, M.D.  ANESTHESIA:  CLINICAL HISTORY:  The patient was admitted because of back and left leg pain. He has failed conservative treatment.  MRI shows a herniated disk at the level of 5-1 with degenerative disk disease and a free fragment.  Surgery was advised.  The risks were explained in the history and physical.  DESCRIPTION OF PROCEDURE:  The patient was taken to the OR and after intubation, the back was prepped with Betadine.  Using the C-arm, we were able to localize the area of 5-1 on the left side.  Infiltration in the skin paramedially was done and incision of an inch was made.  The incision was carried down through the fascia.  Then using the C-arm as well as the dilator, we were able to go all the way down to the area between the facets and the laminae of 5 and 1.  The final dilator was inserted.  Then we brought the microscope into the area.  The muscles were coagulated and using the drill, we removed the lower lamina of L5 and the upper of S1.  A thick yellow ligament was also excised.  Retraction was done and indeed we found that the S1 nerve root was swollen and reddish.  Lysis was accomplished and were able to move medially the S1 nerve root.  Immediately, we found a large herniated disk with a fragment going to the body  of S1.  Incision was made and two large free fragments were removed.  Then we entered the disk space and total gross diskectomy medially and laterally was accomplished; at the end, we had a good decompression of the S1 nerve root.  Investigation above, below and medially was negative.  From then on, Valsalva maneuver was negative.  The area was   irrigated.  Fentanyl and Depo-Medrol were left in the epidural space and the wound was closed with Vicryl and Steri-Strips. Dictated by:   Tanya Nones. Jeral Fruit, M.D. Attending Physician:  Danella Penton DD:  09/13/01 TD:  09/15/01 Job: [id number]

## 2010-09-12 NOTE — H&P (Signed)
[place]. [facility]  Patient:    SLEVIN, GUNBY Visit Number: [id number] MRN: [id number]          Service Type: SUR Location: 3000 3022 01 Attending Physician:  Danella Penton Dictated by:   Tanya Nones. Jeral Fruit, M.D. Admit Date:  09/13/2001                           History and Physical  HISTORY OF PRESENT ILLNESS:  Mr. Hornstein is a gentleman who was seen by me initially a month ago because of back pain with radiation to the hip.  The patient had conservative treatment including chiropractor manipulation without any improvement.  He is getting worse.  We did an MRI but he decided to continue working, but now he came back after telling me that he is worse and he wants to proceed with surgery.  He denies any pain of the right leg.  He is quite miserable and although he is able to walk, nevertheless he noted that he has quite a bit of restriction.  PAST MEDICAL HISTORY:  Negative.  ALLERGIES:  PENICILLIN.  SOCIAL HISTORY:  Negative.  FAMILY HISTORY:  Unremarkable.  REVIEW OF SYSTEMS:  Positive for history of some blood in the urine, and back pain.  PHYSICAL EXAMINATION:  GENERAL:  The patient came to my office and was limping from the left leg.  HEENT:  Normal.  NECK:  Normal.  LUNGS:  Clear.  HEART:  Heart sounds normal.  ABDOMEN:  Normal.  EXTREMITIES:  Normal pulses.  NEUROLOGIC:  Mental status normal.  Cranial nerves normal.  Strength 5/5 except in the left foot where I found weakening of plantar flexion, being 3/5.  Reflexes symmetrical with absence of the left ankle jerk.  Straight leg raising on the right side positive about 80, left side about 60 degrees. Sensation shows he complains of numbness in the left foot.  LABORATORY DATA:  The MRI showed that indeed he has: 1. Large herniated disk at L5-S1 with displacement of the thecal sac and    compromise of the S1 nerve root. 2. Degenerative disk disease.  CLINICAL  IMPRESSION: 1. Left L5-S1 herniated disk. 2. Degenerative disk disease.  RECOMMENDATIONS:  The patient is admitted for surgery.  The procedure will be an L5-S1 diskectomy using the Metrx system with the x-ray C arm.  He knows about the risks such as infection, CSF leak, worsening of the pain, paralysis, need of further surgery, damage to the vessels of the abdomen. Dictated by:   Tanya Nones. Jeral Fruit, M.D. Attending Physician:  Danella Penton DD:  09/13/01 TD:  09/13/01 Job: [id number]

## 2010-11-03 ENCOUNTER — Other Ambulatory Visit: Payer: Self-pay | Admitting: Internal Medicine

## 2010-12-04 ENCOUNTER — Other Ambulatory Visit: Payer: Self-pay | Admitting: Internal Medicine

## 2010-12-22 DIAGNOSIS — I10 Essential (primary) hypertension: Secondary | ICD-10-CM

## 2010-12-23 ENCOUNTER — Encounter: Payer: Self-pay | Admitting: Internal Medicine

## 2010-12-23 DIAGNOSIS — Z0001 Encounter for general adult medical examination with abnormal findings: Secondary | ICD-10-CM | POA: Insufficient documentation

## 2010-12-23 DIAGNOSIS — Z0000 Encounter for general adult medical examination without abnormal findings: Secondary | ICD-10-CM | POA: Insufficient documentation

## 2010-12-24 ENCOUNTER — Other Ambulatory Visit: Payer: Self-pay | Admitting: Internal Medicine

## 2010-12-24 ENCOUNTER — Telehealth: Payer: Self-pay | Admitting: *Deleted

## 2010-12-24 ENCOUNTER — Other Ambulatory Visit (INDEPENDENT_AMBULATORY_CARE_PROVIDER_SITE_OTHER): Payer: Self-pay

## 2010-12-24 DIAGNOSIS — Z125 Encounter for screening for malignant neoplasm of prostate: Secondary | ICD-10-CM

## 2010-12-24 DIAGNOSIS — Z0000 Encounter for general adult medical examination without abnormal findings: Secondary | ICD-10-CM

## 2010-12-24 DIAGNOSIS — E119 Type 2 diabetes mellitus without complications: Secondary | ICD-10-CM

## 2010-12-24 LAB — CBC WITH DIFFERENTIAL/PLATELET
Basophils Absolute: 0 10*3/uL (ref 0.0–0.1)
Basophils Relative: 0.8 % (ref 0.0–3.0)
Eosinophils Absolute: 0.1 10*3/uL (ref 0.0–0.7)
Eosinophils Relative: 1.3 % (ref 0.0–5.0)
HCT: 41 % (ref 39.0–52.0)
Hemoglobin: 14.1 g/dL (ref 13.0–17.0)
Lymphocytes Relative: 46.3 % — ABNORMAL HIGH (ref 12.0–46.0)
Lymphs Abs: 2.1 10*3/uL (ref 0.7–4.0)
MCHC: 34.3 g/dL (ref 30.0–36.0)
MCV: 85.6 fl (ref 78.0–100.0)
Monocytes Absolute: 0.4 10*3/uL (ref 0.1–1.0)
Monocytes Relative: 7.9 % (ref 3.0–12.0)
Neutro Abs: 1.9 10*3/uL (ref 1.4–7.7)
Neutrophils Relative %: 43.7 % (ref 43.0–77.0)
Platelets: 164 10*3/uL (ref 150.0–400.0)
RBC: 4.79 Mil/uL (ref 4.22–5.81)
RDW: 13.6 % (ref 11.5–14.6)
WBC: 4.5 10*3/uL (ref 4.5–10.5)

## 2010-12-24 LAB — LIPID PANEL
Cholesterol: 195 mg/dL (ref 0–200)
HDL: 38.6 mg/dL — ABNORMAL LOW
Total CHOL/HDL Ratio: 5
Triglycerides: 369 mg/dL — ABNORMAL HIGH (ref 0.0–149.0)
VLDL: 73.8 mg/dL — ABNORMAL HIGH (ref 0.0–40.0)

## 2010-12-24 LAB — URINALYSIS, ROUTINE W REFLEX MICROSCOPIC
Hgb urine dipstick: NEGATIVE
Urine Glucose: >=1000
Urobilinogen, UA: 0.2 (ref 0.0–1.0)

## 2010-12-24 LAB — BASIC METABOLIC PANEL
Calcium: 8.8 mg/dL (ref 8.4–10.5)
GFR: 115.67 mL/min (ref >60.00)
Glucose, Bld: 304 mg/dL — ABNORMAL HIGH (ref 70–99)
Sodium: 137 mEq/L (ref 135–145)

## 2010-12-24 LAB — PSA: PSA: 0.65 ng/mL (ref 0.10–4.00)

## 2010-12-24 LAB — TSH: TSH: 1.53 u[IU]/mL (ref 0.35–5.50)

## 2010-12-24 LAB — HEMOGLOBIN A1C: Hgb A1c MFr Bld: 10.9 % — ABNORMAL HIGH (ref 4.6–6.5)

## 2010-12-24 NOTE — Telephone Encounter (Signed)
Pt is in lab requesting labwork no orders are in the system. Pt is seeing Dr. Jonny Ruiz on 12/25/10. Entering CPX labs

## 2010-12-26 ENCOUNTER — Encounter: Payer: Self-pay | Admitting: Internal Medicine

## 2010-12-26 ENCOUNTER — Ambulatory Visit (INDEPENDENT_AMBULATORY_CARE_PROVIDER_SITE_OTHER): Payer: 59 | Admitting: Internal Medicine

## 2010-12-26 VITALS — BP 120/80 | HR 88 | Temp 98.9°F | Ht 72.0 in | Wt 249.4 lb

## 2010-12-26 DIAGNOSIS — Z0000 Encounter for general adult medical examination without abnormal findings: Secondary | ICD-10-CM

## 2010-12-26 DIAGNOSIS — E119 Type 2 diabetes mellitus without complications: Secondary | ICD-10-CM

## 2010-12-26 MED ORDER — METFORMIN HCL 1000 MG PO TABS: 1000.0000 mg | ORAL_TABLET | Freq: Two times a day (BID) | ORAL | Status: DC

## 2010-12-26 MED ORDER — GLUCOSE BLOOD VI STRP: ORAL_STRIP | Status: DC

## 2010-12-26 MED ORDER — ONETOUCH ULTRASOFT LANCETS MISC: Status: DC

## 2010-12-26 MED ORDER — PIOGLITAZONE HCL 45 MG PO TABS: 45.0000 mg | ORAL_TABLET | Freq: Every day | ORAL | Status: DC

## 2010-12-26 MED ORDER — PRAVASTATIN SODIUM 40 MG PO TABS: 40.0000 mg | ORAL_TABLET | Freq: Every day | ORAL | Status: DC

## 2010-12-26 NOTE — Assessment & Plan Note (Signed)
Uncontrolled, pt out of metformin/saxagliptin for 2 mo with increased polys ; stopped as he felt it was not working well anyway;  To change to actos 45 , and metformin 1000 bid;  Gave glucometer and strips today

## 2010-12-26 NOTE — Progress Notes (Signed)
Subjective:    Patient ID: Dylan [name], male    DOB: 09-May-1970, 40 y.o.   MRN: [id number]  HPI Here for wellness and f/u;  Overall doing ok;  Pt denies CP, worsening SOB, DOE, wheezing, orthopnea, PND, worsening LE edema, palpitations, dizziness or syncope.  Pt denies neurological change such as new Headache, facial or extremity weakness.  Pt denies polydipsia, polyuria, or low sugar symptoms. Pt states overall good compliance with treatment and medications, good tolerability, and trying to follow lower cholesterol diet.  Pt denies worsening depressive symptoms, suicidal ideation or panic. No fever, wt loss, night sweats, loss of appetite, or other constitutional symptoms.  Pt states good ability with ADL's, low fall risk, home safety reviewed and adequate, no significant changes in hearing or vision, and occasionally active with exercise. Past Medical History  Diagnosis Date  . ALLERGIC RHINITIS 11/25/2007  . ASTHMA 11/25/2007  . CARPAL TUNNEL SYNDROME, LEFT, MILD 11/25/2007  . DIABETES MELLITUS, TYPE II 11/25/2007  . HYPERLIPIDEMIA 11/25/2007  . NECK PAIN 08/27/2009  . Other specified forms of hearing loss 05/10/2009  . Other symptoms referable to lower leg joint 11/25/2007  . SHOULDER PAIN, LEFT 08/27/2009  . Unspecified hearing loss 04/25/2008   Past Surgical History  Procedure Date  . S/p back surgury 2002    Lumbar disc    reports that he has never smoked. He does not have any smokeless tobacco history on file. He reports that he does not drink alcohol or use illicit drugs. family history includes Diabetes in his other. Allergies  Allergen Reactions  . Penicillins    Current Outpatient Prescriptions on File Prior to Visit  Medication Sig Dispense Refill  . aspirin 81 MG tablet Take 81 mg by mouth daily.         Review of Systems Review of Systems  Constitutional: Negative for diaphoresis, activity change, appetite change and unexpected weight change.  HENT: Negative for hearing  loss, ear pain, facial swelling, mouth sores and neck stiffness.   Eyes: Negative for pain, redness and visual disturbance.  Respiratory: Negative for shortness of breath and wheezing.   Cardiovascular: Negative for chest pain and palpitations.  Gastrointestinal: Negative for diarrhea, blood in stool, abdominal distention and rectal pain.  Genitourinary: Negative for hematuria, flank pain and decreased urine volume.  Musculoskeletal: Negative for myalgias and joint swelling.  Skin: Negative for color change and wound.  Neurological: Negative for syncope and numbness.  Hematological: Negative for adenopathy.  Psychiatric/Behavioral: Negative for hallucinations, self-injury, decreased concentration and agitation.      Objective:   Physical Exam BP 120/80  Pulse 88  Temp(Src) 98.9 F (37.2 C) (Oral)  Ht 6' (1.829 m)  Wt 249 lb 6 oz (113.116 kg)  BMI 33.82 kg/m2  SpO2 95% Physical Exam  VS noted. obese Constitutional: Pt is oriented to person, place, and time. Appears well-developed and well-nourished.  HENT:  Head: Normocephalic and atraumatic.  Right Ear: External ear normal.  Left Ear: External ear normal.  Nose: Nose normal.  Mouth/Throat: Oropharynx is clear and moist.  Eyes: Conjunctivae and EOM are normal. Pupils are equal, round, and reactive to light.  Neck: Normal range of motion. Neck supple. No JVD present. No tracheal deviation present.  Cardiovascular: Normal rate, regular rhythm, normal heart sounds and intact distal pulses.   Pulmonary/Chest: Effort normal and breath sounds normal.  Abdominal: Soft. Bowel sounds are normal. There is no tenderness.  Musculoskeletal: Normal range of motion. Exhibits no edema.  Lymphadenopathy:  Has no cervical adenopathy.  Neurological: Pt is alert and oriented to person, place, and time. Pt has normal reflexes. No cranial nerve deficit.  Skin: Skin is warm and dry. No rash noted.  Psychiatric:  Has  normal mood and affect. Behavior  is normal.         Assessment & Plan:

## 2010-12-26 NOTE — Assessment & Plan Note (Signed)

## 2010-12-26 NOTE — Patient Instructions (Signed)
Start the metformin 1000 mg twice per day Increase the actos to 45 mg per day Continue all other medications as before You are given the glucometer today Your medication refills and prescription for supplies were all sent to [facility] Please return in 6 mo with Lab testing done 3-5 days before

## 2011-06-24 ENCOUNTER — Other Ambulatory Visit (INDEPENDENT_AMBULATORY_CARE_PROVIDER_SITE_OTHER): Payer: 59

## 2011-06-24 DIAGNOSIS — E119 Type 2 diabetes mellitus without complications: Secondary | ICD-10-CM

## 2011-06-24 LAB — LIPID PANEL
Cholesterol: 159 mg/dL (ref 0–200)
LDL Cholesterol: 96 mg/dL (ref 0–99)
Triglycerides: 107 mg/dL (ref 0.0–149.0)
VLDL: 21.4 mg/dL (ref 0.0–40.0)

## 2011-06-24 LAB — BASIC METABOLIC PANEL
BUN: 14 mg/dL (ref 6–23)
Calcium: 9 mg/dL (ref 8.4–10.5)
Creatinine, Ser: 0.8 mg/dL (ref 0.4–1.5)
GFR: 139.29 mL/min (ref >60.00)

## 2011-06-25 ENCOUNTER — Ambulatory Visit (INDEPENDENT_AMBULATORY_CARE_PROVIDER_SITE_OTHER): Payer: 59 | Admitting: Internal Medicine

## 2011-06-25 ENCOUNTER — Encounter: Payer: Self-pay | Admitting: Internal Medicine

## 2011-06-25 VITALS — BP 110/82 | HR 83 | Temp 97.7°F | Ht 72.0 in | Wt 264.8 lb

## 2011-06-25 DIAGNOSIS — I10 Essential (primary) hypertension: Secondary | ICD-10-CM

## 2011-06-25 DIAGNOSIS — E785 Hyperlipidemia, unspecified: Secondary | ICD-10-CM

## 2011-06-25 DIAGNOSIS — E119 Type 2 diabetes mellitus without complications: Secondary | ICD-10-CM

## 2011-06-25 DIAGNOSIS — Z0000 Encounter for general adult medical examination without abnormal findings: Secondary | ICD-10-CM

## 2011-06-25 MED ORDER — ONETOUCH ULTRASOFT LANCETS MISC: Status: AC

## 2011-06-25 MED ORDER — GLUCOSE BLOOD VI STRP: ORAL_STRIP | Status: AC

## 2011-06-25 MED ORDER — PIOGLITAZONE HCL 45 MG PO TABS: 45.0000 mg | ORAL_TABLET | Freq: Every day | ORAL | Status: DC

## 2011-06-25 MED ORDER — PRAVASTATIN SODIUM 80 MG PO TABS: 80.0000 mg | ORAL_TABLET | Freq: Every day | ORAL | Status: DC

## 2011-06-25 MED ORDER — METFORMIN HCL 1000 MG PO TABS: 1000.0000 mg | ORAL_TABLET | Freq: Two times a day (BID) | ORAL | Status: DC

## 2011-06-25 NOTE — Patient Instructions (Signed)
Increase the pravachol to 80 mg per day Continue all other medications as before You are given all the hardcopy refills today Please continue to watch your diabetic diet and try to lose weight Please return in 6 mo with Lab testing done 3-5 days before

## 2011-06-28 ENCOUNTER — Encounter: Payer: Self-pay | Admitting: Internal Medicine

## 2011-06-28 NOTE — Assessment & Plan Note (Signed)
stable overall by hx and exam, most recent data reviewed with pt, and pt to continue medical treatment as before BP Readings from Last 3 Encounters:  06/25/11 110/82  12/26/10 120/80  11/14/09 100/70

## 2011-06-28 NOTE — Assessment & Plan Note (Signed)
stable overall by hx and exam, most recent data reviewed with pt, and pt to continue medical treatment as before  Lab Results  Component Value Date   HGBA1C 7.2* 06/24/2011

## 2011-06-28 NOTE — Assessment & Plan Note (Addendum)
stable overall by hx and exam, most recent data reviewed with pt, and pt to continue medical treatment as before except incr the pravachol to 80 mg, goal ldl < 70  Lab Results  Component Value Date   LDLCALC 96 06/24/2011

## 2011-06-28 NOTE — Progress Notes (Signed)
  Subjective:    Patient ID: Dylan [name], male    DOB: 1970/04/29, 41 y.o.   MRN: [id number]  HPI  Here to f/u; overall doing ok,  Pt denies chest pain, increased sob or doe, wheezing, orthopnea, PND, increased LE swelling, palpitations, dizziness or syncope.  Pt denies new neurological symptoms such as new headache, or facial or extremity weakness or numbness   Pt denies polydipsia, polyuria, or low sugar symptoms such as weakness or confusion improved with po intake.  Pt states overall good compliance with meds, trying to follow lower cholesterol, diabetic diet, wt overall stable but little exercise however.  No acute complaints Past Medical History  Diagnosis Date  . ALLERGIC RHINITIS 11/25/2007  . ASTHMA 11/25/2007  . CARPAL TUNNEL SYNDROME, LEFT, MILD 11/25/2007  . DIABETES MELLITUS, TYPE II 11/25/2007  . HYPERLIPIDEMIA 11/25/2007  . NECK PAIN 08/27/2009  . Other specified forms of hearing loss 05/10/2009  . Other symptoms referable to lower leg joint 11/25/2007  . SHOULDER PAIN, LEFT 08/27/2009  . Unspecified hearing loss 04/25/2008   Past Surgical History  Procedure Date  . S/p back surgury 2002    Lumbar disc    reports that he has never smoked. He does not have any smokeless tobacco history on file. He reports that he does not drink alcohol or use illicit drugs. family history includes Diabetes in his other. Allergies  Allergen Reactions  . Penicillins    Current Outpatient Prescriptions on File Prior to Visit  Medication Sig Dispense Refill  . aspirin 81 MG tablet Take 81 mg by mouth daily.          Review of Systems Review of Systems  Constitutional: Negative for diaphoresis and unexpected weight change.  HENT: Negative for drooling and tinnitus.   Eyes: Negative for photophobia and visual disturbance.  Respiratory: Negative for choking and stridor.   Gastrointestinal: Negative for vomiting and blood in stool.  Genitourinary: Negative for hematuria and decreased urine  volume.    Objective:   Physical Exam BP 110/82  Pulse 83  Temp(Src) 97.7 F (36.5 C) (Oral)  Ht 6' (1.829 m)  Wt 264 lb 12 oz (120.09 kg)  BMI 35.91 kg/m2  SpO2 94% Physical Exam  VS noted, not ill appearing Constitutional: Pt appears well-developed and well-nourished.  HENT: Head: Normocephalic.  Right Ear: External ear normal.  Left Ear: External ear normal.  Eyes: Conjunctivae and EOM are normal. Pupils are equal, round, and reactive to light.  Neck: Normal range of motion. Neck supple.  Cardiovascular: Normal rate and regular rhythm.   Pulmonary/Chest: Effort normal and breath sounds normal.  Neurological: Pt is alert. No cranial nerve deficit.  Skin: Skin is warm. No erythema.  Psychiatric: Pt behavior is normal. Thought content normal.     Assessment & Plan:

## 2011-12-30 ENCOUNTER — Other Ambulatory Visit (INDEPENDENT_AMBULATORY_CARE_PROVIDER_SITE_OTHER): Payer: 59

## 2011-12-30 DIAGNOSIS — Z0000 Encounter for general adult medical examination without abnormal findings: Secondary | ICD-10-CM

## 2011-12-30 DIAGNOSIS — E785 Hyperlipidemia, unspecified: Secondary | ICD-10-CM

## 2011-12-30 DIAGNOSIS — E119 Type 2 diabetes mellitus without complications: Secondary | ICD-10-CM

## 2011-12-30 LAB — HEMOGLOBIN A1C: Hgb A1c MFr Bld: 7.8 % — ABNORMAL HIGH (ref 4.6–6.5)

## 2011-12-30 LAB — LDL CHOLESTEROL, DIRECT: Direct LDL: 112.8 mg/dL

## 2011-12-30 LAB — CBC WITH DIFFERENTIAL/PLATELET
Basophils Relative: 0.5 % (ref 0.0–3.0)
Eosinophils Relative: 1.1 % (ref 0.0–5.0)
HCT: 41.8 % (ref 39.0–52.0)
Lymphs Abs: 2.3 10*3/uL (ref 0.7–4.0)
MCV: 86.2 fl (ref 78.0–100.0)
Monocytes Absolute: 0.4 10*3/uL (ref 0.1–1.0)
Neutro Abs: 2.9 10*3/uL (ref 1.4–7.7)
RBC: 4.85 Mil/uL (ref 4.22–5.81)
WBC: 5.7 10*3/uL (ref 4.5–10.5)

## 2011-12-30 LAB — LIPID PANEL
Cholesterol: 199 mg/dL (ref 0–200)
HDL: 35.8 mg/dL — ABNORMAL LOW (ref >39.00)
Total CHOL/HDL Ratio: 6
VLDL: 51.6 mg/dL — ABNORMAL HIGH (ref 0.0–40.0)

## 2011-12-30 LAB — TSH: TSH: 2.32 u[IU]/mL (ref 0.35–5.50)

## 2011-12-30 LAB — BASIC METABOLIC PANEL
BUN: 14 mg/dL (ref 6–23)
Creatinine, Ser: 0.8 mg/dL (ref 0.4–1.5)
GFR: 131.23 mL/min (ref >60.00)
Potassium: 4.2 mEq/L (ref 3.5–5.1)

## 2011-12-30 LAB — HEPATIC FUNCTION PANEL: Total Bilirubin: 0.7 mg/dL (ref 0.3–1.2)

## 2011-12-30 LAB — URINALYSIS, ROUTINE W REFLEX MICROSCOPIC
Bilirubin Urine: NEGATIVE
Hgb urine dipstick: NEGATIVE
Nitrite: NEGATIVE
Total Protein, Urine: NEGATIVE
Urobilinogen, UA: 0.2 (ref 0.0–1.0)

## 2011-12-31 ENCOUNTER — Encounter: Payer: Self-pay | Admitting: Internal Medicine

## 2011-12-31 ENCOUNTER — Ambulatory Visit (INDEPENDENT_AMBULATORY_CARE_PROVIDER_SITE_OTHER): Payer: 59 | Admitting: Internal Medicine

## 2011-12-31 VITALS — BP 108/80 | HR 87 | Temp 97.6°F | Resp 17 | Wt 263.8 lb

## 2011-12-31 DIAGNOSIS — Z23 Encounter for immunization: Secondary | ICD-10-CM

## 2011-12-31 DIAGNOSIS — Z136 Encounter for screening for cardiovascular disorders: Secondary | ICD-10-CM

## 2011-12-31 DIAGNOSIS — E119 Type 2 diabetes mellitus without complications: Secondary | ICD-10-CM

## 2011-12-31 DIAGNOSIS — Z0000 Encounter for general adult medical examination without abnormal findings: Secondary | ICD-10-CM

## 2011-12-31 MED ORDER — PIOGLITAZONE HCL 45 MG PO TABS: 45.0000 mg | ORAL_TABLET | Freq: Every day | ORAL | Status: DC

## 2011-12-31 MED ORDER — INFLUENZA VIRUS VACC SPLIT PF IM SUSP
0.5000 mL | Freq: Once | INTRAMUSCULAR | Status: AC
  Administered 2011-12-31: 0.5 mL via INTRAMUSCULAR

## 2011-12-31 MED ORDER — GLIPIZIDE ER 2.5 MG PO TB24: 2.5000 mg | ORAL_TABLET | Freq: Every day | ORAL | Status: DC

## 2011-12-31 MED ORDER — PRAVASTATIN SODIUM 80 MG PO TABS: 80.0000 mg | ORAL_TABLET | Freq: Every day | ORAL | Status: DC

## 2011-12-31 MED ORDER — METFORMIN HCL 1000 MG PO TABS: 1000.0000 mg | ORAL_TABLET | Freq: Two times a day (BID) | ORAL | Status: DC

## 2011-12-31 NOTE — Patient Instructions (Addendum)
You had the flu shot today Your EKG was ok today Your ears were irrigated today of wax Take all new medications as prescribed - the low dose glipizide 2.5 mg ER per day Continue all other medications as before Please continue your efforts at being more active, low cholesterol diet, and weight control. You are otherwise up to date with prevention Please return in 6 mo with Lab testing done 3-5 days before

## 2011-12-31 NOTE — Addendum Note (Signed)
Addended by: Jackson Latino on: 12/31/2011 10:10 AM   Modules accepted: Orders

## 2011-12-31 NOTE — Progress Notes (Signed)
Subjective:    Patient ID: Dylan [name], male    DOB: [birth year], 41 y.o.   MRN: [id number]  HPI Here for wellness and f/u;  Overall doing ok;  Pt denies CP, worsening SOB, DOE, wheezing, orthopnea, PND, worsening LE edema, palpitations, dizziness or syncope.  Pt denies neurological change such as new Headache, facial or extremity weakness.  Pt denies polydipsia, polyuria, or low sugar symptoms. Pt states overall good compliance with treatment and medications, good tolerability, and trying to follow lower cholesterol diet.  Pt denies worsening depressive symptoms, suicidal ideation or panic. No fever, wt loss, night sweats, loss of appetite, or other constitutional symptoms.  Pt states good ability with ADL's, low fall risk, home safety reviewed and adequate, no significant changes in hearing or vision, and occasionally active with exercise, actually less in the past 6 mo with a few lbs wt gain, and not as much attention to diet,  Has been to DM education class prior.   Past Medical History  Diagnosis Date  . ALLERGIC RHINITIS 11/25/2007  . ASTHMA 11/25/2007  . CARPAL TUNNEL SYNDROME, LEFT, MILD 11/25/2007  . DIABETES MELLITUS, TYPE II 11/25/2007  . HYPERLIPIDEMIA 11/25/2007  . NECK PAIN 08/27/2009  . Other specified forms of hearing loss 05/10/2009  . Other symptoms referable to lower leg joint 11/25/2007  . SHOULDER PAIN, LEFT 08/27/2009  . Unspecified hearing loss 04/25/2008   Past Surgical History  Procedure Date  . S/p back surgury 2002    Lumbar disc    reports that he has never smoked. He does not have any smokeless tobacco history on file. He reports that he does not drink alcohol or use illicit drugs. family history includes Diabetes in his other. Allergies  Allergen Reactions  . Penicillins    Current Outpatient Prescriptions on File Prior to Visit  Medication Sig Dispense Refill  . aspirin 81 MG tablet Take 81 mg by mouth daily.        Marland Kitchen glucose blood (ONE TOUCH ULTRA TEST) test  strip PRN  100 each  5  . Lancets (ONETOUCH ULTRASOFT) lancets PRN  100 each  3  . DISCONTD: metFORMIN (GLUCOPHAGE) 1000 MG tablet Take 1 tablet (1,000 mg total) by mouth 2 (two) times daily with a meal.  180 tablet  3  . DISCONTD: pioglitazone (ACTOS) 45 MG tablet Take 1 tablet (45 mg total) by mouth daily.  90 tablet  3  . DISCONTD: pravastatin (PRAVACHOL) 80 MG tablet Take 1 tablet (80 mg total) by mouth daily.  90 tablet  3  . glipiZIDE (GLUCOTROL XL) 2.5 MG 24 hr tablet Take 1 tablet (2.5 mg total) by mouth daily.  90 tablet  3   Review of Systems Review of Systems  Constitutional: Negative for diaphoresis, activity change, appetite change and unexpected weight change.  HENT: Negative for hearing loss, ear pain, facial swelling, mouth sores and neck stiffness.   Eyes: Negative for pain, redness and visual disturbance.  Respiratory: Negative for shortness of breath and wheezing.   Cardiovascular: Negative for chest pain and palpitations.  Gastrointestinal: Negative for diarrhea, blood in stool, abdominal distention and rectal pain.  Genitourinary: Negative for hematuria, flank pain and decreased urine volume.  Musculoskeletal: Negative for myalgias and joint swelling.  Skin: Negative for color change and wound.  Neurological: Negative for syncope and numbness.  Hematological: Negative for adenopathy.  Psychiatric/Behavioral: Negative for hallucinations, self-injury, decreased concentration and agitation.      Objective:   Physical Exam BP 108/80  Pulse 87  Temp 97.6 F (36.4 C) (Oral)  Resp 17  Wt 263 lb 12 oz (119.636 kg)  SpO2 94% Physical Exam  VS noted Constitutional: Pt is oriented to person, place, and time. Appears well-developed and well-nourished.  Head: Normocephalic and atraumatic.  Right Ear: External ear normal.  Left Ear: External ear normal.  Nose: Nose normal.  Mouth/Throat: Oropharynx is clear and moist.  Eyes: Conjunctivae and EOM are normal. Pupils are  equal, round, and reactive to light.  Neck: Normal range of motion. Neck supple. No JVD present. No tracheal deviation present.  Cardiovascular: Normal rate, regular rhythm, normal heart sounds and intact distal pulses.   Pulmonary/Chest: Effort normal and breath sounds normal.  Abdominal: Soft. Bowel sounds are normal. There is no tenderness.  Musculoskeletal: Normal range of motion. Exhibits no edema.  Lymphadenopathy:  Has no cervical adenopathy.  Neurological: Pt is alert and oriented to person, place, and time. Pt has normal reflexes. No cranial nerve deficit.  Skin: Skin is warm and dry. No rash noted.  Psychiatric:  Has  normal mood and affect. Behavior is normal.     Assessment & Plan:

## 2011-12-31 NOTE — Assessment & Plan Note (Signed)
Mild worsening, to add glipizide ER 2.5 mg qd

## 2011-12-31 NOTE — Assessment & Plan Note (Signed)

## 2011-12-31 NOTE — Addendum Note (Signed)
Addended by: Jackson Latino on: 12/31/2011 09:53 AM   Modules accepted: Orders

## 2012-02-09 ENCOUNTER — Other Ambulatory Visit: Payer: Self-pay

## 2012-02-09 DIAGNOSIS — E119 Type 2 diabetes mellitus without complications: Secondary | ICD-10-CM

## 2012-02-09 MED ORDER — PIOGLITAZONE HCL 45 MG PO TABS: 45.0000 mg | ORAL_TABLET | Freq: Every day | ORAL | Status: DC

## 2012-06-28 ENCOUNTER — Other Ambulatory Visit: Payer: Self-pay

## 2012-06-28 DIAGNOSIS — E119 Type 2 diabetes mellitus without complications: Secondary | ICD-10-CM

## 2012-06-28 MED ORDER — METFORMIN HCL 1000 MG PO TABS: 1000.0000 mg | ORAL_TABLET | Freq: Two times a day (BID) | ORAL | Status: DC

## 2012-07-07 ENCOUNTER — Ambulatory Visit: Payer: 59 | Admitting: Internal Medicine

## 2012-07-20 ENCOUNTER — Other Ambulatory Visit (INDEPENDENT_AMBULATORY_CARE_PROVIDER_SITE_OTHER): Payer: 59

## 2012-07-20 DIAGNOSIS — E119 Type 2 diabetes mellitus without complications: Secondary | ICD-10-CM

## 2012-07-20 LAB — LIPID PANEL
Total CHOL/HDL Ratio: 4
Triglycerides: 68 mg/dL (ref 0.0–149.0)

## 2012-07-20 LAB — BASIC METABOLIC PANEL
CO2: 27 mEq/L (ref 19–32)
Chloride: 102 mEq/L (ref 96–112)
Creatinine, Ser: 0.9 mg/dL (ref 0.4–1.5)
Potassium: 4.2 mEq/L (ref 3.5–5.1)

## 2012-07-21 ENCOUNTER — Encounter: Payer: Self-pay | Admitting: Internal Medicine

## 2012-07-21 ENCOUNTER — Ambulatory Visit (INDEPENDENT_AMBULATORY_CARE_PROVIDER_SITE_OTHER): Payer: 59 | Admitting: Internal Medicine

## 2012-07-21 VITALS — BP 120/70 | HR 82 | Temp 98.0°F | Ht 72.0 in | Wt 255.8 lb

## 2012-07-21 DIAGNOSIS — E785 Hyperlipidemia, unspecified: Secondary | ICD-10-CM

## 2012-07-21 DIAGNOSIS — H9193 Unspecified hearing loss, bilateral: Secondary | ICD-10-CM | POA: Insufficient documentation

## 2012-07-21 DIAGNOSIS — H9190 Unspecified hearing loss, unspecified ear: Secondary | ICD-10-CM

## 2012-07-21 DIAGNOSIS — I10 Essential (primary) hypertension: Secondary | ICD-10-CM

## 2012-07-21 DIAGNOSIS — E119 Type 2 diabetes mellitus without complications: Secondary | ICD-10-CM

## 2012-07-21 MED ORDER — GLIPIZIDE ER 5 MG PO TB24: 5.0000 mg | ORAL_TABLET | Freq: Every day | ORAL | Status: DC

## 2012-07-21 MED ORDER — PIOGLITAZONE HCL 45 MG PO TABS: 45.0000 mg | ORAL_TABLET | Freq: Every day | ORAL | Status: DC

## 2012-07-21 MED ORDER — METFORMIN HCL 1000 MG PO TABS: 1000.0000 mg | ORAL_TABLET | Freq: Two times a day (BID) | ORAL | Status: DC

## 2012-07-21 MED ORDER — GLIPIZIDE ER 2.5 MG PO TB24: 2.5000 mg | ORAL_TABLET | Freq: Every day | ORAL | Status: DC

## 2012-07-21 MED ORDER — PRAVASTATIN SODIUM 80 MG PO TABS: 80.0000 mg | ORAL_TABLET | Freq: Every day | ORAL | Status: DC

## 2012-07-21 NOTE — Assessment & Plan Note (Signed)
On high dose pravachol, o/w stable, for lower chol diet  Lab Results  Component Value Date   LDLCALC 95 07/20/2012   Goal ldl <70

## 2012-07-21 NOTE — Patient Instructions (Addendum)
Please increase the glipizide ER to 5 mg per day Please continue all other medications as before, and refills have been done if requested. Please have the pharmacy call with any other refills you may need. Please continue your efforts at being more active, low cholesterol diet, and weight control. You are otherwise up to date with prevention measures today. Thank you for enrolling in MyChart. Please follow the instructions below to securely access your online medical record. MyChart allows you to send messages to your doctor, view your test results, renew your prescriptions, schedule appointments, and more. To Log into My Chart online, please go by [employer] or [employer] to [email or web address], or download the MyChart App from the [employer] .  Your Username is: [email or web address] (pass Clinical cytogeneticist) Please send a Engineer, water on Mychart later today. Please return in 6 months, or sooner if needed, with Lab testing done 3-5 days before\

## 2012-07-21 NOTE — Progress Notes (Signed)
  Subjective:    Patient ID: Dylan [name], male    DOB: 04/18/1971, 42 y.o.   MRN: [id number]  HPI  Here to f/u; overall doing ok,  Pt denies chest pain, increased sob or doe, wheezing, orthopnea, PND, increased LE swelling, palpitations, dizziness or syncope.  Pt denies polydipsia, polyuria, or low sugar symptoms such as weakness or confusion improved with po intake.  Pt denies new neurological symptoms such as new headache, or facial or extremity weakness or numbness.   Pt states overall good compliance with meds, has been trying to follow lower cholesterol, diabetic diet, with wt overall stable,  but little exercise however.  No acute complaints except for bilat hearing loss  - ? wax Past Medical History  Diagnosis Date  . ALLERGIC RHINITIS 11/25/2007  . ASTHMA 11/25/2007  . CARPAL TUNNEL SYNDROME, LEFT, MILD 11/25/2007  . DIABETES MELLITUS, TYPE II 11/25/2007  . HYPERLIPIDEMIA 11/25/2007  . NECK PAIN 08/27/2009  . Other specified forms of hearing loss 05/10/2009  . Other symptoms referable to lower leg joint 11/25/2007  . SHOULDER PAIN, LEFT 08/27/2009  . Unspecified hearing loss 04/25/2008   Past Surgical History  Procedure Laterality Date  . S/p back surgury  2002    Lumbar disc    reports that he has never smoked. He does not have any smokeless tobacco history on file. He reports that he does not drink alcohol or use illicit drugs. family history includes Diabetes in his other. Allergies  Allergen Reactions  . Penicillins    Current Outpatient Prescriptions on File Prior to Visit  Medication Sig Dispense Refill  . aspirin 81 MG tablet Take 81 mg by mouth daily.         No current facility-administered medications on file prior to visit.   Review of Systems  Constitutional: Negative for unexpected weight change, or unusual diaphoresis  HENT: Negative for tinnitus.   Eyes: Negative for photophobia and visual disturbance.  Respiratory: Negative for choking and stridor.    Gastrointestinal: Negative for vomiting and blood in stool.  Genitourinary: Negative for hematuria and decreased urine volume.  Musculoskeletal: Negative for acute joint swelling Skin: Negative for color change and wound.  Neurological: Negative for tremors and numbness other than noted  Psychiatric/Behavioral: Negative for decreased concentration or  hyperactivity.       Objective:   Physical Exam BP 120/70  Pulse 82  Temp(Src) 98 F (36.7 C) (Oral)  Ht 6' (1.829 m)  Wt 255 lb 12 oz (116.007 kg)  BMI 34.68 kg/m2  SpO2 95% VS noted,  Constitutional: Pt appears well-developed and well-nourished.  HENT: Head: NCAT.  Right Ear: External ear normal.  Left Ear: External ear normal.  Bilat canals cleared of wax impactions Eyes: Conjunctivae and EOM are normal. Pupils are equal, round, and reactive to light.  Neck: Normal range of motion. Neck supple.  Cardiovascular: Normal rate and regular rhythm.   Pulmonary/Chest: Effort normal and breath sounds normal.  Abd:  Soft, NT, non-distended, + BS Neurological: Pt is alert. Not confused  Skin: Skin is warm. No erythema.  Psychiatric: Pt behavior is normal. Thought content normal.     Assessment & Plan:

## 2012-07-21 NOTE — Assessment & Plan Note (Signed)
Improved with irrigation 

## 2012-07-21 NOTE — Assessment & Plan Note (Signed)
stable overall by history and exam, recent data reviewed with pt, and pt to continue medical treatment as before,  to f/u any worsening symptoms or concerns BP Readings from Last 3 Encounters:  07/21/12 120/70  12/31/11 108/80  06/25/11 110/82

## 2012-07-21 NOTE — Assessment & Plan Note (Signed)
Mild uncontrolled, for incr glipizide ER to 5 mg qd,  to f/u any worsening symptoms or concerns with labs next visit

## 2012-10-04 ENCOUNTER — Other Ambulatory Visit: Payer: Self-pay | Admitting: Internal Medicine

## 2013-01-01 ENCOUNTER — Other Ambulatory Visit: Payer: Self-pay | Admitting: Internal Medicine

## 2013-01-19 ENCOUNTER — Ambulatory Visit: Payer: 59 | Admitting: Internal Medicine

## 2013-02-07 ENCOUNTER — Ambulatory Visit (INDEPENDENT_AMBULATORY_CARE_PROVIDER_SITE_OTHER): Payer: 59 | Admitting: Internal Medicine

## 2013-02-07 ENCOUNTER — Ambulatory Visit (INDEPENDENT_AMBULATORY_CARE_PROVIDER_SITE_OTHER): Payer: 59

## 2013-02-07 ENCOUNTER — Encounter: Payer: Self-pay | Admitting: Internal Medicine

## 2013-02-07 VITALS — BP 102/78 | HR 87 | Temp 97.9°F | Ht 72.0 in | Wt 240.1 lb

## 2013-02-07 DIAGNOSIS — IMO0001 Reserved for inherently not codable concepts without codable children: Secondary | ICD-10-CM

## 2013-02-07 DIAGNOSIS — H9190 Unspecified hearing loss, unspecified ear: Secondary | ICD-10-CM

## 2013-02-07 DIAGNOSIS — E119 Type 2 diabetes mellitus without complications: Secondary | ICD-10-CM | POA: Insufficient documentation

## 2013-02-07 DIAGNOSIS — Z23 Encounter for immunization: Secondary | ICD-10-CM

## 2013-02-07 DIAGNOSIS — E785 Hyperlipidemia, unspecified: Secondary | ICD-10-CM

## 2013-02-07 DIAGNOSIS — F29 Unspecified psychosis not due to a substance or known physiological condition: Secondary | ICD-10-CM

## 2013-02-07 DIAGNOSIS — I10 Essential (primary) hypertension: Secondary | ICD-10-CM

## 2013-02-07 DIAGNOSIS — H9192 Unspecified hearing loss, left ear: Secondary | ICD-10-CM

## 2013-02-07 LAB — HEMOGLOBIN A1C: Hgb A1c MFr Bld: 6.3 % (ref 4.6–6.5)

## 2013-02-07 MED ORDER — ASPIRIN EC 325 MG PO TBEC: 325.0000 mg | DELAYED_RELEASE_TABLET | Freq: Every day | ORAL | Status: DC

## 2013-02-07 MED ORDER — ZIPRASIDONE HCL 20 MG PO CAPS: 20.0000 mg | ORAL_CAPSULE | Freq: Two times a day (BID) | ORAL | Status: DC

## 2013-02-07 NOTE — Progress Notes (Signed)
Subjective:    Patient ID: Dylan [name], male    DOB: 1970/05/10, 42 y.o.   MRN: [id number]  HPI  Here to f/u with wife who gives hx, after helped home from [place] where pt traveled earlier this month for work training involving some stress per wife; unfortunatly became agitated and had to be taken by police to ER, eval by MRI, EEG, LP and neg tox screens, thought per psychiatry to have psychosis NOS, hospd in behavioral health, intitailly tx with klonopin adn zyprexa but some concern about increased sugar prompted change to geodon, now on this for 5 day, home now for 3 days.  Metformin stopped due to concern about elev cr , now on actos only,  Pt denies polydipsia, polyuria, or low sugar symptoms such as weakness or confusion improved with po intake.  CBG 113 this am.  Pt only c/o bilat upper neck/occipital sharp pains without radiation of the pain, worse to extend head, better to flex.  Also with left ear hearing loss for several days, tends to have recurrent wax buildup, no pain or d/c Past Medical History  Diagnosis Date  . ALLERGIC RHINITIS 11/25/2007  . ASTHMA 11/25/2007  . CARPAL TUNNEL SYNDROME, LEFT, MILD 11/25/2007  . DIABETES MELLITUS, TYPE II 11/25/2007  . HYPERLIPIDEMIA 11/25/2007  . NECK PAIN 08/27/2009  . Other specified forms of hearing loss 05/10/2009  . Other symptoms referable to lower leg joint 11/25/2007  . SHOULDER PAIN, LEFT 08/27/2009  . Unspecified hearing loss 04/25/2008   Past Surgical History  Procedure Laterality Date  . S/p back surgury  2002    Lumbar disc    reports that he has never smoked. He does not have any smokeless tobacco history on file. He reports that he does not drink alcohol or use illicit drugs. family history includes Diabetes in his other. Allergies  Allergen Reactions  . Penicillins    Current Outpatient Prescriptions on File Prior to Visit  Medication Sig Dispense Refill  . pioglitazone (ACTOS) 45 MG tablet Take 1 tablet (45 mg total) by  mouth daily.  90 tablet  3   No current facility-administered medications on file prior to visit.   Review of Systems  Constitutional: Negative for unexpected weight change, or unusual diaphoresis  HENT: Negative for tinnitus.   Eyes: Negative for photophobia and visual disturbance.  Respiratory: Negative for choking and stridor.   Gastrointestinal: Negative for vomiting and blood in stool.  Genitourinary: Negative for hematuria and decreased urine volume.  Musculoskeletal: Negative for acute joint swelling Skin: Negative for color change and wound.  Neurological: Negative for tremors and numbness other than noted  Psychiatric/Behavioral: Negative for decreased concentration or  hyperactivity.       Objective:   Physical Exam BP 102/78  Pulse 87  Temp(Src) 97.9 F (36.6 C) (Oral)  Ht 6' (1.829 m)  Wt 240 lb 2 oz (108.92 kg)  BMI 32.56 kg/m2  SpO2 98% VS noted,  Constitutional: Pt appears well-developed and well-nourished.  HENT: Head: NCAT.  Right Ear: External ear normal.  Left Ear: External ear normal.  Eyes: Conjunctivae and EOM are normal. Pupils are equal, round, and reactive to light.  Neck: Normal range of motion. Neck supple.  Cardiovascular: Normal rate and regular rhythm.   Pulmonary/Chest: Effort normal and breath sounds normal.  Abd:  Soft, NT, non-distended, + BS Neurological: Pt is alert. Not confused , motor 5/5 Skin: Skin is warm. No erythema.  Psychiatric: Pt behavior is normal.  Assessment & Plan:

## 2013-02-07 NOTE — Assessment & Plan Note (Signed)
Improved with irrigation 

## 2013-02-07 NOTE — Patient Instructions (Addendum)
You had the flu shot today  Your left ear was irrigated of wax today  Please continue all other medications as before  Please have the pharmacy call with any other refills you may need.  Please go to the LAB in the Basement (turn left off the elevator) for the tests to be done today  You will be contacted by phone if any changes need to be made immediately.  Otherwise, you will receive a letter about your results with an explanation, but please check with MyChart first.  You will be contacted regarding the referral for: psychiatry  Please return in 3 months, or sooner if needed

## 2013-02-07 NOTE — Assessment & Plan Note (Signed)
stable overall by history and exam, recent data reviewed with pt, and pt to continue medical treatment as before,  to f/u any worsening symptoms or concerns Lab Results  Component Value Date   HGBA1C 7.7* 07/20/2012

## 2013-02-07 NOTE — Assessment & Plan Note (Signed)
stable overall by history and exam, recent data reviewed with pt, and pt to continue medical treatment as before,  to f/u any worsening symptoms or concerns BP Readings from Last 3 Encounters:  02/07/13 102/78  07/21/12 120/70  12/31/11 108/80

## 2013-02-07 NOTE — Assessment & Plan Note (Signed)
stable overall by history and exam, and pt to continue medical treatment as before,  to f/u any worsening symptoms or concerns, for psychiatry referral

## 2013-02-08 ENCOUNTER — Encounter: Payer: Self-pay | Admitting: Internal Medicine

## 2013-02-08 LAB — LIPID PANEL
HDL: 31.4 mg/dL — ABNORMAL LOW (ref >39.00)
Triglycerides: 482 mg/dL — ABNORMAL HIGH (ref 0.0–149.0)

## 2013-02-08 LAB — LDL CHOLESTEROL, DIRECT: Direct LDL: 105.5 mg/dL

## 2013-02-08 LAB — BASIC METABOLIC PANEL
BUN: 15 mg/dL (ref 6–23)
CO2: 29 mEq/L (ref 19–32)
Chloride: 103 mEq/L (ref 96–112)
GFR: 105.27 mL/min (ref >60.00)
Glucose, Bld: 155 mg/dL — ABNORMAL HIGH (ref 70–99)
Potassium: 4.2 mEq/L (ref 3.5–5.1)

## 2013-02-08 LAB — HEPATIC FUNCTION PANEL
ALT: 39 U/L (ref 0–53)
AST: 24 U/L (ref 0–37)
Albumin: 4.1 g/dL (ref 3.5–5.2)
Total Protein: 7.8 g/dL (ref 6.0–8.3)

## 2013-04-21 ENCOUNTER — Telehealth: Payer: Self-pay

## 2013-04-21 DIAGNOSIS — E119 Type 2 diabetes mellitus without complications: Secondary | ICD-10-CM

## 2013-04-21 MED ORDER — PIOGLITAZONE HCL 45 MG PO TABS: 45.0000 mg | ORAL_TABLET | Freq: Every day | ORAL | Status: DC

## 2013-04-21 NOTE — Telephone Encounter (Signed)
refill 

## 2013-07-06 ENCOUNTER — Other Ambulatory Visit: Payer: Self-pay | Admitting: Internal Medicine

## 2013-10-02 ENCOUNTER — Other Ambulatory Visit: Payer: Self-pay | Admitting: Internal Medicine

## 2013-11-02 ENCOUNTER — Telehealth: Payer: Self-pay

## 2013-11-02 NOTE — Telephone Encounter (Signed)
Diabetic Bundle- called and LM on pt VM  TCB and schedule follow up appointment a1c and bmet need to be ordered

## 2014-04-14 ENCOUNTER — Other Ambulatory Visit: Payer: Self-pay | Admitting: Internal Medicine

## 2014-06-13 ENCOUNTER — Other Ambulatory Visit: Payer: Self-pay | Admitting: Internal Medicine

## 2014-06-13 ENCOUNTER — Telehealth: Payer: Self-pay | Admitting: Internal Medicine

## 2014-06-13 NOTE — Telephone Encounter (Signed)
Pt hasn't been seen since 2014 need appt...Dylan [name]/lmb

## 2014-06-13 NOTE — Telephone Encounter (Signed)
Pt called in requesting refill on his pioglitazone (ACTOS) 45 MG tablet [[id number]][[id number]] ?    Send to [facility] on [address]

## 2014-06-18 ENCOUNTER — Telehealth: Payer: Self-pay | Admitting: Internal Medicine

## 2014-06-18 ENCOUNTER — Other Ambulatory Visit: Payer: Self-pay | Admitting: Internal Medicine

## 2014-06-18 MED ORDER — PIOGLITAZONE HCL 45 MG PO TABS: 45.0000 mg | ORAL_TABLET | Freq: Every day | ORAL | Status: DC

## 2014-06-18 NOTE — Telephone Encounter (Signed)
Will send 30 day only must keep appt for additional refills...Raechel Chute/lmb

## 2014-06-18 NOTE — Telephone Encounter (Signed)
Patient is requesting refill on actos. He states he is completely out. He is scheduled for an appt this Wednesday 06/20/14.

## 2014-06-19 ENCOUNTER — Other Ambulatory Visit (INDEPENDENT_AMBULATORY_CARE_PROVIDER_SITE_OTHER): Payer: 59

## 2014-06-19 ENCOUNTER — Other Ambulatory Visit: Payer: Self-pay | Admitting: Internal Medicine

## 2014-06-19 DIAGNOSIS — E119 Type 2 diabetes mellitus without complications: Secondary | ICD-10-CM

## 2014-06-19 LAB — LIPID PANEL
Cholesterol: 122 mg/dL (ref 0–200)
HDL: 38.1 mg/dL — AB (ref >39.00)
LDL Cholesterol: 71 mg/dL (ref 0–99)
NONHDL: 83.9
Total CHOL/HDL Ratio: 3
Triglycerides: 66 mg/dL (ref 0.0–149.0)
VLDL: 13.2 mg/dL (ref 0.0–40.0)

## 2014-06-19 LAB — BASIC METABOLIC PANEL
BUN: 10 mg/dL (ref 6–23)
CHLORIDE: 104 meq/L (ref 96–112)
CO2: 29 mEq/L (ref 19–32)
Calcium: 8.8 mg/dL (ref 8.4–10.5)
Creatinine, Ser: 0.85 mg/dL (ref 0.40–1.50)
GFR: 126.17 mL/min (ref >60.00)
GLUCOSE: 128 mg/dL — AB (ref 70–99)
Potassium: 4.3 mEq/L (ref 3.5–5.1)
Sodium: 138 mEq/L (ref 135–145)

## 2014-06-19 LAB — HEMOGLOBIN A1C: Hgb A1c MFr Bld: 6.1 % (ref 4.6–6.5)

## 2014-06-19 LAB — HEPATIC FUNCTION PANEL
ALBUMIN: 4.2 g/dL (ref 3.5–5.2)
ALT: 24 U/L (ref 0–53)
AST: 20 U/L (ref 0–37)
Alkaline Phosphatase: 63 U/L (ref 39–117)
Bilirubin, Direct: 0.1 mg/dL (ref 0.0–0.3)
TOTAL PROTEIN: 7.3 g/dL (ref 6.0–8.3)
Total Bilirubin: 0.5 mg/dL (ref 0.2–1.2)

## 2014-06-20 ENCOUNTER — Other Ambulatory Visit: Payer: Self-pay

## 2014-06-20 ENCOUNTER — Encounter: Payer: Self-pay | Admitting: Internal Medicine

## 2014-06-20 ENCOUNTER — Ambulatory Visit (INDEPENDENT_AMBULATORY_CARE_PROVIDER_SITE_OTHER): Payer: 59 | Admitting: Internal Medicine

## 2014-06-20 VITALS — BP 112/80 | HR 78 | Temp 99.2°F | Resp 20 | Ht 72.0 in | Wt 273.0 lb

## 2014-06-20 DIAGNOSIS — H9193 Unspecified hearing loss, bilateral: Secondary | ICD-10-CM

## 2014-06-20 DIAGNOSIS — Z23 Encounter for immunization: Secondary | ICD-10-CM

## 2014-06-20 DIAGNOSIS — E119 Type 2 diabetes mellitus without complications: Secondary | ICD-10-CM

## 2014-06-20 DIAGNOSIS — Z0000 Encounter for general adult medical examination without abnormal findings: Secondary | ICD-10-CM

## 2014-06-20 MED ORDER — PRAVASTATIN SODIUM 80 MG PO TABS: 80.0000 mg | ORAL_TABLET | Freq: Every day | ORAL | Status: DC

## 2014-06-20 MED ORDER — GLIPIZIDE ER 5 MG PO TB24: ORAL_TABLET | ORAL | Status: DC

## 2014-06-20 MED ORDER — PIOGLITAZONE HCL 45 MG PO TABS
45.0000 mg | ORAL_TABLET | Freq: Every day | ORAL | Status: DC
Start: 2014-06-20 — End: 2014-08-06

## 2014-06-20 MED ORDER — ASPIRIN EC 81 MG PO TBEC: 81.0000 mg | DELAYED_RELEASE_TABLET | Freq: Every day | ORAL | Status: DC

## 2014-06-20 NOTE — Patient Instructions (Addendum)
You had the flu shot today, and the tetanus shot (Tdap)  Your ears were irrigated of wax today  Please also start Aspirin 81 mg - 1 per day - to prevent future heart attack and stroke  Please continue all other medications as before, and refills have been done if requested.  Please have the pharmacy call with any other refills you may need.  Please continue your efforts at being more active, low cholesterol diet, and weight control.  You are otherwise up to date with prevention measures today.  Please keep your appointments with your specialists as you may have planned  Since you did not have all of your lab work done - Please go to the LAB in the Basement (turn left off the elevator) for the tests to be done today  You will be contacted by phone if any changes need to be made immediately.  Otherwise, you will receive a letter about your results with an explanation, but please check with MyChart first.  Please remember to sign up for MyChart if you have not done so, as this will be important to you in the future with finding out test results, communicating by private email, and scheduling acute appointments online when needed.  Please return in 6 months, or sooner if needed, with Lab testing done 3-5 days before

## 2014-06-20 NOTE — Assessment & Plan Note (Signed)
stable overall by history and exam, recent data reviewed with pt, and pt to continue medical treatment as before,  to f/u any worsening symptoms or concerns, to also start asa 81 qd Lab Results  Component Value Date   HGBA1C 6.1 06/19/2014

## 2014-06-20 NOTE — Addendum Note (Signed)
Addended by: Maurene CapesPOTTS, Oneka Parada M on: 06/20/2014 11:02 AM   Modules accepted: Orders

## 2014-06-20 NOTE — Progress Notes (Signed)
Subjective:    Patient ID: Dylan [name], male    DOB: 03/24/1971, 44 y.o.   MRN: [id number]  HPI Here for wellness and f/u;  Overall doing ok;  Pt denies CP, worsening SOB, DOE, wheezing, orthopnea, PND, worsening LE edema, palpitations, dizziness or syncope.  Pt denies neurological change such as new headache, facial or extremity weakness.  Pt denies polydipsia, polyuria, or low sugar symptoms. Pt states overall good compliance with treatment and medications, good tolerability, and has been trying to follow lower cholesterol diet.  Pt denies worsening depressive symptoms, suicidal ideation or panic. No fever, night sweats, wt loss, loss of appetite, or other constitutional symptoms.  Pt states good ability with ADL's, has low fall risk, home safety reviewed and adequate, no other significant changes in hearing or vision, and only occasionally active with exercise.  No current complaints  Not taking asa. Has bilat hearing loss again with wax impactions Past Medical History  Diagnosis Date  . ALLERGIC RHINITIS 11/25/2007  . ASTHMA 11/25/2007  . CARPAL TUNNEL SYNDROME, LEFT, MILD 11/25/2007  . DIABETES MELLITUS, TYPE II 11/25/2007  . HYPERLIPIDEMIA 11/25/2007  . NECK PAIN 08/27/2009  . Other specified forms of hearing loss 05/10/2009  . Other symptoms referable to lower leg joint 11/25/2007  . SHOULDER PAIN, LEFT 08/27/2009  . Unspecified hearing loss 04/25/2008   Past Surgical History  Procedure Laterality Date  . S/p back surgury  2002    Lumbar disc    reports that he has never smoked. He does not have any smokeless tobacco history on file. He reports that he does not drink alcohol or use illicit drugs. family history includes Diabetes in his other. Allergies  Allergen Reactions  . Penicillins    Current Outpatient Prescriptions on File Prior to Visit  Medication Sig Dispense Refill  . glipiZIDE (GLUCOTROL XL) 5 MG 24 hr tablet TAKE 1 TABLET (5 MG TOTAL) BY MOUTH DAILY. 90 tablet 2  .  pioglitazone (ACTOS) 45 MG tablet Take 1 tablet (45 mg total) by mouth daily. 30 tablet 0  . pioglitazone (ACTOS) 45 MG tablet TAKE 1 TABLET BY MOUTH EVERY DAY 30 tablet 0  . pravastatin (PRAVACHOL) 80 MG tablet TAKE 1 TABLET BY MOUTH EVERY DAY 90 tablet 2  . pravastatin (PRAVACHOL) 80 MG tablet TAKE 1 TABLET BY MOUTH EVERY DAY 90 tablet 3  . pravastatin (PRAVACHOL) 80 MG tablet TAKE 1 TABLET BY MOUTH EVERY DAY 90 tablet 1   No current facility-administered medications on file prior to visit.   Review of Systems Constitutional: Negative for increased diaphoresis, other activity, appetite or other siginficant weight change  HENT: Negative for worsening hearing loss, ear pain, facial swelling, mouth sores and neck stiffness.   Eyes: Negative for other worsening pain, redness or visual disturbance.  Respiratory: Negative for shortness of breath and wheezing.   Cardiovascular: Negative for chest pain and palpitations.  Gastrointestinal: Negative for diarrhea, blood in stool, abdominal distention or other pain Genitourinary: Negative for hematuria, flank pain or change in urine volume.  Musculoskeletal: Negative for myalgias or other joint complaints.  Skin: Negative for color change and wound.  Neurological: Negative for syncope and numbness. other than noted Hematological: Negative for adenopathy. or other swelling Psychiatric/Behavioral: Negative for hallucinations, self-injury, decreased concentration or other worsening agitation.      Objective:   Physical Exam BP 112/80 mmHg  Pulse 78  Temp(Src) 99.2 F (37.3 C) (Oral)  Resp 20  Ht 6' (1.829 m)  [employer]  273 lb (123.832 kg)  BMI 37.02 kg/m2  SpO2 98% VS noted, not ill apeapring Constitutional: Pt is oriented to person, place, and time. Appears well-developed and well-nourished. Lavella Lemons/obese Head: Normocephalic and atraumatic.  Right Ear: External ear normal.  Left Ear: External ear normal.  Nose: Nose normal.  Mouth/Throat: Oropharynx is  clear and moist.  Eyes: Conjunctivae and EOM are normal. Pupils are equal, round, and reactive to light.  Neck: Normal range of motion. Neck supple. No JVD present. No tracheal deviation present.  Cardiovascular: Normal rate, regular rhythm, normal heart sounds and intact distal pulses.   Pulmonary/Chest: Effort normal and breath sounds without rales or wheezing  Abdominal: Soft. Bowel sounds are normal. NT. No HSM  Musculoskeletal: Normal range of motion. Exhibits no edema.  Lymphadenopathy:  Has no cervical adenopathy.  Neurological: Pt is alert and oriented to person, place, and time. Pt has normal reflexes. No cranial nerve deficit. Motor grossly intact Skin: Skin is warm and dry. No rash noted.  Psychiatric:  Has normal mood and affect. Behavior is normal. except somewhat flat affect, no longer on geodon     Assessment & Plan:

## 2014-06-20 NOTE — Progress Notes (Signed)
Pre visit review using our clinic review tool, if applicable. No additional management support is needed unless otherwise documented below in the visit note. 

## 2014-06-20 NOTE — Assessment & Plan Note (Signed)

## 2014-06-20 NOTE — Assessment & Plan Note (Signed)
Recurring wax impactions - irrigate today

## 2014-07-11 ENCOUNTER — Other Ambulatory Visit: Payer: Self-pay | Admitting: Internal Medicine

## 2014-08-06 ENCOUNTER — Other Ambulatory Visit: Payer: Self-pay | Admitting: *Deleted

## 2014-08-06 MED ORDER — PIOGLITAZONE HCL 45 MG PO TABS: 45.0000 mg | ORAL_TABLET | Freq: Every day | ORAL | Status: DC

## 2014-12-10 ENCOUNTER — Other Ambulatory Visit (INDEPENDENT_AMBULATORY_CARE_PROVIDER_SITE_OTHER): Payer: 59

## 2014-12-10 DIAGNOSIS — E119 Type 2 diabetes mellitus without complications: Secondary | ICD-10-CM

## 2014-12-10 LAB — LIPID PANEL
CHOL/HDL RATIO: 3
CHOLESTEROL: 131 mg/dL (ref 0–200)
HDL: 38.7 mg/dL — ABNORMAL LOW (ref >39.00)
LDL CALC: 80 mg/dL (ref 0–99)
NonHDL: 92.41
TRIGLYCERIDES: 63 mg/dL (ref 0.0–149.0)
VLDL: 12.6 mg/dL (ref 0.0–40.0)

## 2014-12-10 LAB — BASIC METABOLIC PANEL
BUN: 9 mg/dL (ref 6–23)
CHLORIDE: 106 meq/L (ref 96–112)
CO2: 29 mEq/L (ref 19–32)
Calcium: 8.8 mg/dL (ref 8.4–10.5)
Creatinine, Ser: 0.79 mg/dL (ref 0.40–1.50)
GFR: 136.99 mL/min (ref >60.00)
Glucose, Bld: 119 mg/dL — ABNORMAL HIGH (ref 70–99)
POTASSIUM: 4 meq/L (ref 3.5–5.1)
SODIUM: 140 meq/L (ref 135–145)

## 2014-12-10 LAB — HEPATIC FUNCTION PANEL
ALK PHOS: 58 U/L (ref 39–117)
ALT: 21 U/L (ref 0–53)
AST: 17 U/L (ref 0–37)
Albumin: 3.9 g/dL (ref 3.5–5.2)
BILIRUBIN DIRECT: 0.1 mg/dL (ref 0.0–0.3)
BILIRUBIN TOTAL: 0.4 mg/dL (ref 0.2–1.2)
Total Protein: 7 g/dL (ref 6.0–8.3)

## 2014-12-10 LAB — HEMOGLOBIN A1C: HEMOGLOBIN A1C: 6 % (ref 4.6–6.5)

## 2014-12-11 ENCOUNTER — Ambulatory Visit (INDEPENDENT_AMBULATORY_CARE_PROVIDER_SITE_OTHER): Payer: 59 | Admitting: Internal Medicine

## 2014-12-11 ENCOUNTER — Ambulatory Visit: Payer: 59 | Admitting: Internal Medicine

## 2014-12-11 ENCOUNTER — Encounter: Payer: Self-pay | Admitting: Internal Medicine

## 2014-12-11 VITALS — BP 124/82 | HR 84 | Temp 98.1°F | Ht 72.0 in | Wt 296.0 lb

## 2014-12-11 DIAGNOSIS — I10 Essential (primary) hypertension: Secondary | ICD-10-CM

## 2014-12-11 DIAGNOSIS — E119 Type 2 diabetes mellitus without complications: Secondary | ICD-10-CM | POA: Diagnosis not present

## 2014-12-11 DIAGNOSIS — Z0189 Encounter for other specified special examinations: Secondary | ICD-10-CM | POA: Diagnosis not present

## 2014-12-11 DIAGNOSIS — E785 Hyperlipidemia, unspecified: Secondary | ICD-10-CM

## 2014-12-11 DIAGNOSIS — Z0000 Encounter for general adult medical examination without abnormal findings: Secondary | ICD-10-CM

## 2014-12-11 MED ORDER — LOSARTAN POTASSIUM 25 MG PO TABS
25.0000 mg | ORAL_TABLET | Freq: Every day | ORAL | Status: DC
Start: 2014-12-11 — End: 2015-07-05

## 2014-12-11 MED ORDER — PRAVASTATIN SODIUM 80 MG PO TABS: 80.0000 mg | ORAL_TABLET | Freq: Every day | ORAL | Status: DC

## 2014-12-11 MED ORDER — PIOGLITAZONE HCL 45 MG PO TABS: 45.0000 mg | ORAL_TABLET | Freq: Every day | ORAL | Status: DC

## 2014-12-11 MED ORDER — GLIPIZIDE ER 5 MG PO TB24: ORAL_TABLET | ORAL | Status: DC

## 2014-12-11 NOTE — Assessment & Plan Note (Signed)
stable overall by history and exam, recent data reviewed with pt, and pt to continue medical treatment as before,  to f/u any worsening symptoms or concerns Lab Results  Component Value Date   HGBA1C 6.0 12/10/2014

## 2014-12-11 NOTE — Progress Notes (Signed)
Pre visit review using our clinic review tool, if applicable. No additional management support is needed unless otherwise documented below in the visit note. 

## 2014-12-11 NOTE — Assessment & Plan Note (Signed)
stable overall by history and exam, recent data reviewed with pt, and pt to continue medical treatment as before,  to f/u any worsening symptoms or concerns BP Readings from Last 3 Encounters:  12/11/14 124/82  06/20/14 112/80  02/07/13 102/78   For losartan 25 for renoprotective

## 2014-12-11 NOTE — Progress Notes (Signed)
   Subjective:    Patient ID: Dylan [name], male    DOB: 12-Aug-1970, 44 y.o.   MRN: [id number]  HPI  Here to f/u; overall doing ok,  Pt denies chest pain, increasing sob or doe, wheezing, orthopnea, PND, increased LE swelling, palpitations, dizziness or syncope.  Pt denies new neurological symptoms such as new headache, or facial or extremity weakness or numbness.  Pt denies polydipsia, polyuria, or low sugar episode.   Pt denies new neurological symptoms such as new headache, or facial or extremity weakness or numbness.   Pt states overall good compliance with meds, mostly trying to follow appropriate diet, with wt overall stable,  but little exercise however.  Not on ACE/ARB Past Medical History  Diagnosis Date  . ALLERGIC RHINITIS 11/25/2007  . ASTHMA 11/25/2007  . CARPAL TUNNEL SYNDROME, LEFT, MILD 11/25/2007  . DIABETES MELLITUS, TYPE II 11/25/2007  . HYPERLIPIDEMIA 11/25/2007  . NECK PAIN 08/27/2009  . Other specified forms of hearing loss 05/10/2009  . Other symptoms referable to lower leg joint 11/25/2007  . SHOULDER PAIN, LEFT 08/27/2009  . Unspecified hearing loss 04/25/2008   Past Surgical History  Procedure Laterality Date  . S/p back surgury  2002    Lumbar disc    reports that he has never smoked. He does not have any smokeless tobacco history on file. He reports that he does not drink alcohol or use illicit drugs. family history includes Diabetes in his other. Allergies  Allergen Reactions  . Penicillins    Current Outpatient Prescriptions on File Prior to Visit  Medication Sig Dispense Refill  . aspirin EC 81 MG tablet Take 1 tablet (81 mg total) by mouth daily. 90 tablet 11   No current facility-administered medications on file prior to visit.    Review of Systems  Constitutional: Negative for unusual diaphoresis or night sweats HENT: Negative for ringing in ear or discharge Eyes: Negative for double vision or worsening visual disturbance.  Respiratory: Negative for  choking and stridor.   Gastrointestinal: Negative for vomiting or other signifcant bowel change Genitourinary: Negative for hematuria or change in urine volume.  Musculoskeletal: Negative for other MSK pain or swelling Skin: Negative for color change and worsening wound.  Neurological: Negative for tremors and numbness other than noted  Psychiatric/Behavioral: Negative for decreased concentration or agitation other than above       Objective:   Physical Exam BP 124/82 mmHg  Pulse 84  Temp(Src) 98.1 F (36.7 C) (Oral)  Ht 6' (1.829 m)  Wt 296 lb (134.265 kg)  BMI 40.14 kg/m2  SpO2 97% VS noted,  Constitutional: Pt appears in no significant distress HENT: Head: NCAT.  Right Ear: External ear normal.  Left Ear: External ear normal.  Eyes: . Pupils are equal, round, and reactive to light. Conjunctivae and EOM are normal Neck: Normal range of motion. Neck supple.  Cardiovascular: Normal rate and regular rhythm.   Pulmonary/Chest: Effort normal and breath sounds without rales or wheezing.  Abd:  Soft, NT, ND, + BS Neurological: Pt is alert. Not confused , motor grossly intact Skin: Skin is warm. No rash, no LE edema Psychiatric: Pt behavior is normal. No agitation.     Assessment & Plan:

## 2014-12-11 NOTE — Assessment & Plan Note (Signed)
stable overall by history and exam, recent data reviewed with pt, and pt to continue medical treatment as before,  to f/u any worsening symptoms or concerns Lab Results  Component Value Date   LDLCALC 80 12/10/2014

## 2014-12-11 NOTE — Patient Instructions (Addendum)
Please take all new medication as prescribed - the low dose losartan 25 mg , which a blood pressure medication that helps protect the kidney from damage from sugar  Please continue all other medications as before, and refills have been done if requested.  Please have the pharmacy call with any other refills you may need.  Please continue your efforts at being more active, low cholesterol diet, and weight control.  You are otherwise up to date with prevention measures today.  Please keep your appointments with your specialists as you may have planned  Please return in 6 months, or sooner if needed, with Lab testing done 3-5 days before

## 2015-06-13 ENCOUNTER — Ambulatory Visit: Payer: 59 | Admitting: Internal Medicine

## 2015-07-04 ENCOUNTER — Other Ambulatory Visit (INDEPENDENT_AMBULATORY_CARE_PROVIDER_SITE_OTHER): Payer: 59

## 2015-07-04 DIAGNOSIS — E119 Type 2 diabetes mellitus without complications: Secondary | ICD-10-CM | POA: Diagnosis not present

## 2015-07-04 DIAGNOSIS — Z0000 Encounter for general adult medical examination without abnormal findings: Secondary | ICD-10-CM

## 2015-07-04 DIAGNOSIS — Z0189 Encounter for other specified special examinations: Secondary | ICD-10-CM | POA: Diagnosis not present

## 2015-07-04 LAB — CBC WITH DIFFERENTIAL/PLATELET
BASOS ABS: 0 10*3/uL (ref 0.0–0.1)
Basophils Relative: 0.6 % (ref 0.0–3.0)
EOS PCT: 2.6 % (ref 0.0–5.0)
Eosinophils Absolute: 0.1 10*3/uL (ref 0.0–0.7)
HCT: 40 % (ref 39.0–52.0)
Hemoglobin: 13.6 g/dL (ref 13.0–17.0)
LYMPHS ABS: 2.9 10*3/uL (ref 0.7–4.0)
Lymphocytes Relative: 52.7 % — ABNORMAL HIGH (ref 12.0–46.0)
MCHC: 34.2 g/dL (ref 30.0–36.0)
MCV: 85.3 fl (ref 78.0–100.0)
MONOS PCT: 7.9 % (ref 3.0–12.0)
Monocytes Absolute: 0.4 10*3/uL (ref 0.1–1.0)
NEUTROS ABS: 2 10*3/uL (ref 1.4–7.7)
NEUTROS PCT: 36.2 % — AB (ref 43.0–77.0)
PLATELETS: 180 10*3/uL (ref 150.0–400.0)
RBC: 4.68 Mil/uL (ref 4.22–5.81)
RDW: 14.1 % (ref 11.5–15.5)
WBC: 5.4 10*3/uL (ref 4.0–10.5)

## 2015-07-04 LAB — BASIC METABOLIC PANEL
BUN: 10 mg/dL (ref 6–23)
CALCIUM: 8.9 mg/dL (ref 8.4–10.5)
CO2: 29 meq/L (ref 19–32)
Chloride: 105 mEq/L (ref 96–112)
Creatinine, Ser: 0.89 mg/dL (ref 0.40–1.50)
GFR: 119.08 mL/min (ref >60.00)
Glucose, Bld: 104 mg/dL — ABNORMAL HIGH (ref 70–99)
POTASSIUM: 3.9 meq/L (ref 3.5–5.1)
SODIUM: 142 meq/L (ref 135–145)

## 2015-07-04 LAB — URINALYSIS, ROUTINE W REFLEX MICROSCOPIC
Bilirubin Urine: NEGATIVE
HGB URINE DIPSTICK: NEGATIVE
Ketones, ur: NEGATIVE
Nitrite: NEGATIVE
PH: 6 (ref 5.0–8.0)
RBC / HPF: NONE SEEN (ref 0–?)
SPECIFIC GRAVITY, URINE: 1.015 (ref 1.000–1.030)
TOTAL PROTEIN, URINE-UPE24: NEGATIVE
URINE GLUCOSE: NEGATIVE
UROBILINOGEN UA: 0.2 (ref 0.0–1.0)

## 2015-07-04 LAB — LIPID PANEL
Cholesterol: 140 mg/dL (ref 0–200)
HDL: 41 mg/dL (ref >39.00)
LDL Cholesterol: 86 mg/dL (ref 0–99)
NONHDL: 99.36
Total CHOL/HDL Ratio: 3
Triglycerides: 67 mg/dL (ref 0.0–149.0)
VLDL: 13.4 mg/dL (ref 0.0–40.0)

## 2015-07-04 LAB — TSH: TSH: 3.33 u[IU]/mL (ref 0.35–4.50)

## 2015-07-04 LAB — HEMOGLOBIN A1C: HEMOGLOBIN A1C: 6.5 % (ref 4.6–6.5)

## 2015-07-04 LAB — MICROALBUMIN / CREATININE URINE RATIO
CREATININE, U: 193.5 mg/dL
MICROALB UR: 1.2 mg/dL (ref 0.0–1.9)
MICROALB/CREAT RATIO: 0.6 mg/g (ref 0.0–30.0)

## 2015-07-04 LAB — PSA: PSA: 0.45 ng/mL (ref 0.10–4.00)

## 2015-07-04 LAB — HEPATIC FUNCTION PANEL
ALK PHOS: 67 U/L (ref 39–117)
ALT: 22 U/L (ref 0–53)
AST: 16 U/L (ref 0–37)
Albumin: 4 g/dL (ref 3.5–5.2)
BILIRUBIN DIRECT: 0.1 mg/dL (ref 0.0–0.3)
TOTAL PROTEIN: 6.9 g/dL (ref 6.0–8.3)
Total Bilirubin: 0.6 mg/dL (ref 0.2–1.2)

## 2015-07-05 ENCOUNTER — Ambulatory Visit (INDEPENDENT_AMBULATORY_CARE_PROVIDER_SITE_OTHER): Payer: 59 | Admitting: Internal Medicine

## 2015-07-05 ENCOUNTER — Encounter: Payer: Self-pay | Admitting: Internal Medicine

## 2015-07-05 VITALS — BP 124/80 | HR 75 | Temp 98.5°F | Resp 20 | Wt 292.0 lb

## 2015-07-05 DIAGNOSIS — Z0000 Encounter for general adult medical examination without abnormal findings: Secondary | ICD-10-CM

## 2015-07-05 DIAGNOSIS — H9193 Unspecified hearing loss, bilateral: Secondary | ICD-10-CM

## 2015-07-05 DIAGNOSIS — I10 Essential (primary) hypertension: Secondary | ICD-10-CM | POA: Diagnosis not present

## 2015-07-05 DIAGNOSIS — M25561 Pain in right knee: Secondary | ICD-10-CM | POA: Diagnosis not present

## 2015-07-05 DIAGNOSIS — Z0001 Encounter for general adult medical examination with abnormal findings: Secondary | ICD-10-CM

## 2015-07-05 DIAGNOSIS — H6123 Impacted cerumen, bilateral: Secondary | ICD-10-CM

## 2015-07-05 DIAGNOSIS — M25562 Pain in left knee: Secondary | ICD-10-CM | POA: Diagnosis not present

## 2015-07-05 DIAGNOSIS — E119 Type 2 diabetes mellitus without complications: Secondary | ICD-10-CM

## 2015-07-05 MED ORDER — PRAVASTATIN SODIUM 80 MG PO TABS: 80.0000 mg | ORAL_TABLET | Freq: Every day | ORAL | Status: DC

## 2015-07-05 MED ORDER — LOSARTAN POTASSIUM 25 MG PO TABS: 25.0000 mg | ORAL_TABLET | Freq: Every day | ORAL | Status: DC

## 2015-07-05 MED ORDER — ASPIRIN EC 81 MG PO TBEC: 81.0000 mg | DELAYED_RELEASE_TABLET | Freq: Every day | ORAL | Status: DC

## 2015-07-05 MED ORDER — PIOGLITAZONE HCL 45 MG PO TABS: 45.0000 mg | ORAL_TABLET | Freq: Every day | ORAL | Status: DC

## 2015-07-05 MED ORDER — NAPROXEN 500 MG PO TABS: 500.0000 mg | ORAL_TABLET | Freq: Two times a day (BID) | ORAL | Status: DC

## 2015-07-05 MED ORDER — GLIPIZIDE ER 5 MG PO TB24: ORAL_TABLET | ORAL | Status: DC

## 2015-07-05 NOTE — Patient Instructions (Addendum)
You had the flu shot today  Please take all new medication as prescribed- the anit-inflammatory as needed for pain  Your ears were irrigated of wax today  Please continue all other medications as before, and refills have been done if requested.  Please have the pharmacy call with any other refills you may need.  Please continue your efforts at being more active, low cholesterol diet, and weight control.  You are otherwise up to date with prevention measures today.  Please keep your appointments with your specialists as you may have planned  Please return in 6 months, or sooner if needed

## 2015-07-05 NOTE — Progress Notes (Signed)
Subjective:    Patient ID: Dylan [name], male    DOB: Aug 04, 1970, 45 y.o.   MRN: [id number]  HPI  Here for wellness and f/u;  Overall doing ok;  Pt denies Chest pain, worsening SOB, DOE, wheezing, orthopnea, PND, worsening LE edema, palpitations, dizziness or syncope.  Pt denies neurological change such as new headache, facial or extremity weakness.  Pt denies polydipsia, polyuria, or low sugar symptoms. Pt states overall good compliance with treatment and medications, good tolerability, and has been trying to follow appropriate diet.  Pt denies worsening depressive symptoms, suicidal ideation or panic. No fever, night sweats, wt loss, loss of appetite, or other constitutional symptoms.  Pt states good ability with ADL's, has low fall risk, home safety reviewed and adequate, no other significant changes in hearing or vision, and only occasionally active with exercise.  Has gained marked wt recently. Wt Readings from Last 3 Encounters:  07/05/15 292 lb (132.45 kg)  12/11/14 296 lb (134.265 kg)  06/20/14 273 lb (123.832 kg)  No c/o moderate gradually worsening bilat knee pain, persistent x months, worse to walk, better to sit, worse left than right, no giveaway, swelling or falls.   Also has 1 wk worsening bilat hearing loss - ? Recurrent wax impaction as before. Past Medical History  Diagnosis Date  . ALLERGIC RHINITIS 11/25/2007  . ASTHMA 11/25/2007  . CARPAL TUNNEL SYNDROME, LEFT, MILD 11/25/2007  . DIABETES MELLITUS, TYPE II 11/25/2007  . HYPERLIPIDEMIA 11/25/2007  . NECK PAIN 08/27/2009  . Other specified forms of hearing loss 05/10/2009  . Other symptoms referable to lower leg joint 11/25/2007  . SHOULDER PAIN, LEFT 08/27/2009  . Unspecified hearing loss 04/25/2008   Past Surgical History  Procedure Laterality Date  . S/p back surgury  2002    Lumbar disc    reports that he has never smoked. He does not have any smokeless tobacco history on file. He reports that he does not drink alcohol  or use illicit drugs. family history includes Diabetes in his other. Allergies  Allergen Reactions  . Penicillins    No current outpatient prescriptions on file prior to visit.   No current facility-administered medications on file prior to visit.   Review of Systems Constitutional: Negative for increased diaphoresis, other activity, appetite or siginficant weight change other than noted HENT: Negative for worsening hearing loss, ear pain, facial swelling, mouth sores and neck stiffness.   Eyes: Negative for other worsening pain, redness or visual disturbance.  Respiratory: Negative for shortness of breath and wheezing  Cardiovascular: Negative for chest pain and palpitations.  Gastrointestinal: Negative for diarrhea, blood in stool, abdominal distention or other pain Genitourinary: Negative for hematuria, flank pain or change in urine volume.  Musculoskeletal: Negative for myalgias or other joint complaints.  Skin: Negative for color change and wound or drainage.  Neurological: Negative for syncope and numbness. other than noted Hematological: Negative for adenopathy. or other swelling Psychiatric/Behavioral: Negative for hallucinations, SI, self-injury, decreased concentration or other worsening agitation.      Objective:   Physical Exam BP 124/80 mmHg  Pulse 75  Temp(Src) 98.5 F (36.9 C) (Oral)  Resp 20  Wt 292 lb (132.45 kg)  SpO2 97% VS noted,  Constitutional: Pt is oriented to person, place, and time. Appears well-developed and well-nourished, in no significant distress Head: Normocephalic and atraumatic.  Right Ear: External ear normal.  Left Ear: External ear normal.  Nose: Nose normal.  Mouth/Throat: Oropharynx is clear and moist.  Eyes:  Conjunctivae and EOM are normal. Pupils are equal, round, and reactive to light.  Neck: Normal range of motion. Neck supple. No JVD present. No tracheal deviation present or significant neck LA or mass Cardiovascular: Normal rate,  regular rhythm, normal heart sounds and intact distal pulses.   Pulmonary/Chest: Effort normal and breath sounds without rales or wheezing  Abdominal: Soft. Bowel sounds are normal. NT. No HSM  Musculoskeletal: Normal range of motion. Exhibits no edema.  Lymphadenopathy:  Has no cervical adenopathy.  Neurological: Pt is alert and oriented to person, place, and time. Pt has normal reflexes. No cranial nerve deficit. Motor grossly intact Skin: Skin is warm and dry. No rash noted.  Psychiatric:  Has normal mood and affect. Behavior is normal.  Bilat knee with mild crepitus, no effusion, NT and has FROM      Assessment & Plan:

## 2015-07-05 NOTE — Progress Notes (Signed)
Pre visit review using our clinic review tool, if applicable. No additional management support is needed unless otherwise documented below in the visit note. 

## 2015-07-06 NOTE — Assessment & Plan Note (Signed)
Improved s/p irrigation bilat ear wax impactions

## 2015-07-06 NOTE — Assessment & Plan Note (Signed)
C/w prob DJD vs other , for nsaid prn, consider refer sport medicine,  to f/u any worsening symptoms or concerns

## 2015-07-06 NOTE — Assessment & Plan Note (Signed)
stable overall by history and exam, recent data reviewed with pt, and pt to continue medical treatment as before,  to f/u any worsening symptoms or concerns BP Readings from Last 3 Encounters:  07/05/15 124/80  12/11/14 124/82  06/20/14 112/80

## 2015-07-06 NOTE — Assessment & Plan Note (Signed)

## 2015-07-06 NOTE — Assessment & Plan Note (Signed)
stable overall by history and exam, recent data reviewed with pt, and pt to continue medical treatment as before,  to f/u any worsening symptoms or concerns Lab Results  Component Value Date   HGBA1C 6.5 07/04/2015

## 2015-07-09 ENCOUNTER — Telehealth: Payer: Self-pay

## 2015-07-09 NOTE — Telephone Encounter (Signed)
Called Dylan [name] today and he stated that he received his flu vaccine on 07/05/2015.  In the OV notes Dr. Jonny RuizJohn also states the same.  Can you confirm and then document in the notes if appropriate.

## 2015-07-25 NOTE — Telephone Encounter (Signed)
I have entered the flu vaccine historically

## 2015-09-15 ENCOUNTER — Other Ambulatory Visit: Payer: Self-pay | Admitting: Internal Medicine

## 2016-01-09 ENCOUNTER — Other Ambulatory Visit (INDEPENDENT_AMBULATORY_CARE_PROVIDER_SITE_OTHER): Payer: 59

## 2016-01-09 ENCOUNTER — Telehealth: Payer: Self-pay

## 2016-01-09 DIAGNOSIS — Z0000 Encounter for general adult medical examination without abnormal findings: Secondary | ICD-10-CM | POA: Diagnosis not present

## 2016-01-09 LAB — HEPATIC FUNCTION PANEL
ALBUMIN: 4.2 g/dL (ref 3.5–5.2)
ALT: 25 U/L (ref 0–53)
AST: 18 U/L (ref 0–37)
Alkaline Phosphatase: 63 U/L (ref 39–117)
Bilirubin, Direct: 0.1 mg/dL (ref 0.0–0.3)
Total Bilirubin: 0.7 mg/dL (ref 0.2–1.2)
Total Protein: 7.2 g/dL (ref 6.0–8.3)

## 2016-01-09 LAB — CBC
HEMATOCRIT: 40.4 % (ref 39.0–52.0)
Hemoglobin: 13.9 g/dL (ref 13.0–17.0)
MCHC: 34.5 g/dL (ref 30.0–36.0)
MCV: 85.3 fl (ref 78.0–100.0)
Platelets: 190 10*3/uL (ref 150.0–400.0)
RBC: 4.73 Mil/uL (ref 4.22–5.81)
RDW: 14.3 % (ref 11.5–15.5)
WBC: 4.9 10*3/uL (ref 4.0–10.5)

## 2016-01-09 LAB — TSH: TSH: 1.5 u[IU]/mL (ref 0.35–4.50)

## 2016-01-09 LAB — MICROALBUMIN / CREATININE URINE RATIO
CREATININE, U: 158.1 mg/dL
MICROALB/CREAT RATIO: 0.4 mg/g (ref 0.0–30.0)
Microalb, Ur: <0.7 mg/dL (ref 0.0–1.9)

## 2016-01-09 LAB — LIPID PANEL
CHOL/HDL RATIO: 4
Cholesterol: 144 mg/dL (ref 0–200)
HDL: 38.1 mg/dL — AB (ref >39.00)
LDL CALC: 92 mg/dL (ref 0–99)
NonHDL: 106.36
TRIGLYCERIDES: 71 mg/dL (ref 0.0–149.0)
VLDL: 14.2 mg/dL (ref 0.0–40.0)

## 2016-01-09 LAB — BASIC METABOLIC PANEL
BUN: 13 mg/dL (ref 6–23)
CALCIUM: 8.9 mg/dL (ref 8.4–10.5)
CHLORIDE: 104 meq/L (ref 96–112)
CO2: 30 meq/L (ref 19–32)
Creatinine, Ser: 0.93 mg/dL (ref 0.40–1.50)
GFR: 112.92 mL/min (ref >60.00)
Glucose, Bld: 145 mg/dL — ABNORMAL HIGH (ref 70–99)
POTASSIUM: 4.2 meq/L (ref 3.5–5.1)
SODIUM: 139 meq/L (ref 135–145)

## 2016-01-09 LAB — HEMOGLOBIN A1C: Hgb A1c MFr Bld: 6.5 % (ref 4.6–6.5)

## 2016-01-09 LAB — PSA: PSA: 0.64 ng/mL (ref 0.10–4.00)

## 2016-01-09 NOTE — Telephone Encounter (Signed)
Labs placed.

## 2016-01-10 ENCOUNTER — Encounter: Payer: Self-pay | Admitting: Internal Medicine

## 2016-01-10 ENCOUNTER — Ambulatory Visit (INDEPENDENT_AMBULATORY_CARE_PROVIDER_SITE_OTHER): Payer: 59 | Admitting: Internal Medicine

## 2016-01-10 VITALS — BP 132/72 | HR 76 | Temp 98.2°F | Resp 20 | Wt 296.0 lb

## 2016-01-10 DIAGNOSIS — I10 Essential (primary) hypertension: Secondary | ICD-10-CM

## 2016-01-10 DIAGNOSIS — E785 Hyperlipidemia, unspecified: Secondary | ICD-10-CM

## 2016-01-10 DIAGNOSIS — E119 Type 2 diabetes mellitus without complications: Secondary | ICD-10-CM | POA: Diagnosis not present

## 2016-01-10 DIAGNOSIS — R220 Localized swelling, mass and lump, head: Secondary | ICD-10-CM | POA: Diagnosis not present

## 2016-01-10 DIAGNOSIS — Z0001 Encounter for general adult medical examination with abnormal findings: Secondary | ICD-10-CM | POA: Diagnosis not present

## 2016-01-10 MED ORDER — ROSUVASTATIN CALCIUM 40 MG PO TABS: 40.0000 mg | ORAL_TABLET | Freq: Every day | ORAL | 3 refills | Status: DC

## 2016-01-10 MED ORDER — PIOGLITAZONE HCL 45 MG PO TABS: 45.0000 mg | ORAL_TABLET | Freq: Every day | ORAL | 3 refills | Status: DC

## 2016-01-10 MED ORDER — LOSARTAN POTASSIUM 25 MG PO TABS: 25.0000 mg | ORAL_TABLET | Freq: Every day | ORAL | 3 refills | Status: DC

## 2016-01-10 MED ORDER — NAPROXEN 500 MG PO TABS: 500.0000 mg | ORAL_TABLET | Freq: Two times a day (BID) | ORAL | 5 refills | Status: DC

## 2016-01-10 MED ORDER — GLIPIZIDE ER 5 MG PO TB24: ORAL_TABLET | ORAL | 3 refills | Status: DC

## 2016-01-10 NOTE — Progress Notes (Signed)
Subjective:    Patient ID: Dylan [name], male    DOB: January 09, 1971, 45 y.o.   MRN: [id number]  HPI  Here for wellness and f/u;  Overall doing ok;  Pt denies Chest pain, worsening SOB, DOE, wheezing, orthopnea, PND, worsening LE edema, palpitations, dizziness or syncope.  Pt denies neurological change such as new headache, facial or extremity weakness.  Pt denies polydipsia, polyuria, or low sugar symptoms.  Pt denies worsening depressive symptoms, suicidal ideation or panic. No fever, night sweats, wt loss, loss of appetite, or other constitutional symptoms.  Pt states good ability with ADL's, has low fall risk, home safety reviewed and adequate, no other significant changes in hearing or vision, and only occasionally active with exercise. Due for flu shot  Pt states overall good compliance with treatment and medications, good tolerability, and has been trying to follow low chol diet. Also with increased size left facial lump just in front of the left ear, no ear or sinus or other facial pain, no overlying skin change, fever or recent trauma.  Past Medical History:  Diagnosis Date  . ALLERGIC RHINITIS 11/25/2007  . ASTHMA 11/25/2007  . CARPAL TUNNEL SYNDROME, LEFT, MILD 11/25/2007  . DIABETES MELLITUS, TYPE II 11/25/2007  . HYPERLIPIDEMIA 11/25/2007  . NECK PAIN 08/27/2009  . Other specified forms of hearing loss 05/10/2009  . Other symptoms referable to lower leg joint 11/25/2007  . SHOULDER PAIN, LEFT 08/27/2009  . Unspecified hearing loss 04/25/2008   Past Surgical History:  Procedure Laterality Date  . s/p back surgury  2002   Lumbar disc    reports that he has never smoked. He does not have any smokeless tobacco history on file. He reports that he does not drink alcohol or use drugs. family history includes Diabetes in his other. Allergies  Allergen Reactions  . Penicillins    Current Outpatient Prescriptions on File Prior to Visit  Medication Sig Dispense Refill  . aspirin EC 81 MG  tablet Take 1 tablet (81 mg total) by mouth daily. 90 tablet 11   No current facility-administered medications on file prior to visit.    Review of Systems Constitutional: Negative for increased diaphoresis, or other activity, appetite or siginficant weight change other than noted HENT: Negative for worsening hearing loss, ear pain, facial swelling, mouth sores and neck stiffness.   Eyes: Negative for other worsening pain, redness or visual disturbance.  Respiratory: Negative for choking or stridor Cardiovascular: Negative for other chest pain and palpitations.  Gastrointestinal: Negative for worsening diarrhea, blood in stool, or abdominal distention Genitourinary: Negative for hematuria, flank pain or change in urine volume.  Musculoskeletal: Negative for myalgias or other joint complaints.  Skin: Negative for other color change and wound or drainage.  Neurological: Negative for syncope and numbness. other than noted Hematological: Negative for adenopathy. or other swelling Psychiatric/Behavioral: Negative for hallucinations, SI, self-injury, decreased concentration or other worsening agitation.      Objective:   Physical Exam BP 132/72   Pulse 76   Temp 98.2 F (36.8 C) (Oral)   Resp 20   Wt 296 lb (134.3 kg)   SpO2 98%   BMI 40.14 kg/m  VS noted,  Constitutional: Pt is oriented to person, place, and time. Appears well-developed and well-nourished, in no significant distress Head: Normocephalic and atraumatic  Eyes: Conjunctivae and EOM are normal. Pupils are equal, round, and reactive to light Right Ear: External ear normal.  Left Ear: External ear normal Nose: Nose normal.  Mouth/Throat:  Oropharynx is clear and moist  Left face with 1 cm preauricular subq nodule/mass soft NT without overlying skin change or drainage Neck: Normal range of motion. Neck supple. No JVD present. No tracheal deviation present or significant neck LA or mass Cardiovascular: Normal rate, regular  rhythm, normal heart sounds and intact distal pulses.   Pulmonary/Chest: Effort normal and breath sounds without rales or wheezing  Abdominal: Soft. Bowel sounds are normal. NT. No HSM  Musculoskeletal: Normal range of motion. Exhibits no edema Lymphadenopathy: Has no cervical adenopathy.  Neurological: Pt is alert and oriented to person, place, and time. Pt has normal reflexes. No cranial nerve deficit. Motor grossly intact Skin: Skin is warm and dry. No rash noted or new ulcers Psychiatric:  Has normal mood and affect. Behavior is normal.     Assessment & Plan:

## 2016-01-10 NOTE — Patient Instructions (Addendum)
You had the flu shot today  OK to stop the pravastatin (pravachol)  Please take all new medication as prescribed - the Crestor (generic) 40 mg per day (but have the pharmacy call us with a less expensive option like lipitor if the crestor is too expensive)  Please continue all other medications as before, and refills have been done if requested (with the actos generic done in hardcopy per your request)  Please have the pharmacy call with any other refills you may need.  Please continue your efforts at being more active, low cholesterol diet, and weight control.  You are otherwise up to date with prevention measures today.  Please keep your appointments with your specialists as you may have planned  You will be contacted regarding the referral for: ENT  Please return in 6 months, or sooner if needed, with Lab testing done 3-5 days before

## 2016-01-10 NOTE — Progress Notes (Signed)
Pre visit review using our clinic review tool, if applicable. No additional management support is needed unless otherwise documented below in the visit note. 

## 2016-01-10 NOTE — Assessment & Plan Note (Addendum)
Most likely enlarging subq cystic type mass, for ENT referral  In addition to the time spent performing CPE, I spent an additional 25 minutes face to face,in which greater than 50% of this time was spent in counseling and coordination of care for patient's acute illness as documented.

## 2016-01-11 NOTE — Assessment & Plan Note (Signed)
Uncontrolled, for change statin to crestor for improved LDL reduction to goal < 70 Lab Results  Component Value Date   LDLCALC 92 01/09/2016

## 2016-01-11 NOTE — Assessment & Plan Note (Signed)

## 2016-01-11 NOTE — Assessment & Plan Note (Signed)
stable overall by history and exam, recent data reviewed with pt, and pt to continue medical treatment as before,  to f/u any worsening symptoms or concerns Lab Results  Component Value Date   HGBA1C 6.5 01/09/2016

## 2016-01-11 NOTE — Assessment & Plan Note (Signed)
stable overall by history and exam, recent data reviewed with pt, and pt to continue medical treatment as before,  to f/u any worsening symptoms or concerns BP Readings from Last 3 Encounters:  01/10/16 132/72  07/05/15 124/80  12/11/14 124/82

## 2016-01-29 ENCOUNTER — Other Ambulatory Visit: Payer: Self-pay | Admitting: *Deleted

## 2016-01-29 MED ORDER — NAPROXEN 500 MG PO TABS: 500.0000 mg | ORAL_TABLET | Freq: Two times a day (BID) | ORAL | 1 refills | Status: DC

## 2016-03-02 DIAGNOSIS — R220 Localized swelling, mass and lump, head: Secondary | ICD-10-CM | POA: Insufficient documentation

## 2016-04-13 ENCOUNTER — Other Ambulatory Visit: Payer: Self-pay | Admitting: Otolaryngology

## 2016-04-14 ENCOUNTER — Other Ambulatory Visit: Payer: Self-pay | Admitting: Otolaryngology

## 2016-04-14 DIAGNOSIS — R220 Localized swelling, mass and lump, head: Secondary | ICD-10-CM

## 2016-07-09 ENCOUNTER — Other Ambulatory Visit (INDEPENDENT_AMBULATORY_CARE_PROVIDER_SITE_OTHER): Payer: 59

## 2016-07-09 DIAGNOSIS — E119 Type 2 diabetes mellitus without complications: Secondary | ICD-10-CM

## 2016-07-09 LAB — HEPATIC FUNCTION PANEL
ALK PHOS: 58 U/L (ref 39–117)
ALT: 27 U/L (ref 0–53)
AST: 22 U/L (ref 0–37)
Albumin: 4.1 g/dL (ref 3.5–5.2)
BILIRUBIN TOTAL: 0.5 mg/dL (ref 0.2–1.2)
Bilirubin, Direct: 0.1 mg/dL (ref 0.0–0.3)
Total Protein: 7.1 g/dL (ref 6.0–8.3)

## 2016-07-09 LAB — LIPID PANEL
CHOL/HDL RATIO: 3
Cholesterol: 124 mg/dL (ref 0–200)
HDL: 43.8 mg/dL (ref >39.00)
LDL Cholesterol: 69 mg/dL (ref 0–99)
NONHDL: 79.89
Triglycerides: 52 mg/dL (ref 0.0–149.0)
VLDL: 10.4 mg/dL (ref 0.0–40.0)

## 2016-07-09 LAB — BASIC METABOLIC PANEL
BUN: 12 mg/dL (ref 6–23)
CHLORIDE: 107 meq/L (ref 96–112)
CO2: 25 mEq/L (ref 19–32)
Calcium: 9 mg/dL (ref 8.4–10.5)
Creatinine, Ser: 0.8 mg/dL (ref 0.40–1.50)
GFR: 134.05 mL/min (ref >60.00)
Glucose, Bld: 171 mg/dL — ABNORMAL HIGH (ref 70–99)
POTASSIUM: 4 meq/L (ref 3.5–5.1)
SODIUM: 140 meq/L (ref 135–145)

## 2016-07-09 LAB — HEMOGLOBIN A1C: HEMOGLOBIN A1C: 6 % (ref 4.6–6.5)

## 2016-07-10 ENCOUNTER — Telehealth: Payer: Self-pay | Admitting: Internal Medicine

## 2016-07-10 ENCOUNTER — Encounter: Payer: Self-pay | Admitting: Internal Medicine

## 2016-07-10 ENCOUNTER — Ambulatory Visit (INDEPENDENT_AMBULATORY_CARE_PROVIDER_SITE_OTHER): Payer: 59 | Admitting: Internal Medicine

## 2016-07-10 VITALS — BP 146/82 | HR 65 | Temp 97.6°F | Ht 72.0 in | Wt 279.0 lb

## 2016-07-10 DIAGNOSIS — R4586 Emotional lability: Secondary | ICD-10-CM

## 2016-07-10 DIAGNOSIS — Z0001 Encounter for general adult medical examination with abnormal findings: Secondary | ICD-10-CM

## 2016-07-10 DIAGNOSIS — E785 Hyperlipidemia, unspecified: Secondary | ICD-10-CM | POA: Diagnosis not present

## 2016-07-10 DIAGNOSIS — H9012 Conductive hearing loss, unilateral, left ear, with unrestricted hearing on the contralateral side: Secondary | ICD-10-CM | POA: Diagnosis not present

## 2016-07-10 DIAGNOSIS — F39 Unspecified mood [affective] disorder: Secondary | ICD-10-CM | POA: Diagnosis not present

## 2016-07-10 DIAGNOSIS — E119 Type 2 diabetes mellitus without complications: Secondary | ICD-10-CM | POA: Diagnosis not present

## 2016-07-10 DIAGNOSIS — I10 Essential (primary) hypertension: Secondary | ICD-10-CM | POA: Diagnosis not present

## 2016-07-10 MED ORDER — PIOGLITAZONE HCL 30 MG PO TABS: 30.0000 mg | ORAL_TABLET | Freq: Every day | ORAL | 3 refills | Status: DC

## 2016-07-10 MED ORDER — LANCETS MISC. MISC: 11 refills | Status: DC

## 2016-07-10 MED ORDER — GLUCOSE BLOOD VI STRP: ORAL_STRIP | 12 refills | Status: DC

## 2016-07-10 MED ORDER — PIOGLITAZONE HCL 30 MG PO TABS
30.0000 mg | ORAL_TABLET | Freq: Every day | ORAL | 3 refills | Status: DC
Start: 2016-07-10 — End: 2017-07-13

## 2016-07-10 NOTE — Patient Instructions (Addendum)
Ok to decrease the actos to 30 mg per day  Your lancets and strips were sent to the pharmacy  Please continue all other medications as before, and refills have been done if requested.  Please have the pharmacy call with any other refills you may need.  Please continue your efforts at being more active, low cholesterol diet, and weight control.  Please keep your appointments with your specialists as you may have planned  Your left ear was irrigated of wax today  Please return in 3 months to the LAB ONLY for a repeat a1c  Please return in 6 months for Office Visit, or sooner if needed, with Lab testing done 3-5 days before

## 2016-07-10 NOTE — Assessment & Plan Note (Addendum)
Suspect mild depression, no Si or HI, declines tx oir referral

## 2016-07-10 NOTE — Telephone Encounter (Signed)
[facility] called want to know the type of meter pt has. Please call them back

## 2016-07-10 NOTE — Assessment & Plan Note (Signed)
Improved after irrigation wax impaction, cont to follow

## 2016-07-10 NOTE — Telephone Encounter (Signed)
Patient should be calling pharmacy to tell them---he's not sure what the name is but will check and let his pharmacy know

## 2016-07-10 NOTE — Addendum Note (Signed)
Addended by: Corwin LevinsJOHN, Joylynn Defrancesco W on: 07/10/2016 11:27 AM   Modules accepted: Orders

## 2016-07-10 NOTE — Assessment & Plan Note (Signed)
No low sugar symptoms, but plans to work on lower calorie diet and further wt loss, will go ahead and reduce the actos to 30 mg, cont all other tx, cont diet, activity and wt control efforts, to recheck a1c LAB only in 3 mo, then repeat at next full labs 6 mo

## 2016-07-10 NOTE — Assessment & Plan Note (Signed)
stable overall by history and exam, recent data reviewed with pt, and pt to continue medical treatment as before,  to f/u any worsening symptoms or concerns Lab Results  Component Value Date   LDLCALC 69 07/09/2016

## 2016-07-10 NOTE — Assessment & Plan Note (Signed)
stable overall by history and exam, recent data reviewed with pt, and pt to continue medical treatment as before,  to f/u any worsening symptoms or concerns, note BP improved with large cuff left arm

## 2016-07-10 NOTE — Progress Notes (Signed)
Subjective:    Patient ID: Dylan [name], male    DOB: Dec 08, 1970, 46 y.o.   MRN: [id number]  HPI Here to f/u; overall doing ok,  Pt denies chest pain, increasing sob or doe, wheezing, orthopnea, PND, increased LE swelling, palpitations, dizziness or syncope.  Pt denies new neurological symptoms such as new headache, or facial or extremity weakness or numbness.  Pt denies polydipsia, polyuria, or low sugar episode.   Pt denies new neurological symptoms such as new headache, or facial or extremity weakness or numbness.   Pt states overall good compliance with meds, mostly trying to follow appropriate diet, with wt overall down significant wt he thinks may be due to work stress; has had some low mood in the past wks but denies Si or HI,  but little exercise however, plans to try to start being active.  Does not want tx for depression.  Does have several wks mild ongoing nasal allergy symptoms with clearish congestion, itch and sneezing, without fever, pain, ST, cough, swelling or wheezing.  Also with decreased hearing left ear, ? Wax impaction for 1 wk, asks for irrigation.  Also needs lancets and strips for cbg's which have been in low 100';s Wt Readings from Last 3 Encounters:  07/10/16 279 lb (126.6 kg)  01/10/16 296 lb (134.3 kg)  07/05/15 292 lb (132.5 kg)   BP Readings from Last 3 Encounters:  07/10/16 (!) 146/82  01/10/16 132/72  07/05/15 124/80   Past Medical History:  Diagnosis Date  . ALLERGIC RHINITIS 11/25/2007  . ASTHMA 11/25/2007  . CARPAL TUNNEL SYNDROME, LEFT, MILD 11/25/2007  . DIABETES MELLITUS, TYPE II 11/25/2007  . HYPERLIPIDEMIA 11/25/2007  . NECK PAIN 08/27/2009  . Other specified forms of hearing loss 05/10/2009  . Other symptoms referable to lower leg joint 11/25/2007  . SHOULDER PAIN, LEFT 08/27/2009  . Unspecified hearing loss 04/25/2008   Past Surgical History:  Procedure Laterality Date  . s/p back surgury  2002   Lumbar disc    reports that he has never smoked.  He has never used smokeless tobacco. He reports that he does not drink alcohol or use drugs. family history includes Diabetes in his other. Allergies  Allergen Reactions  . Penicillins    Current Outpatient Prescriptions on File Prior to Visit  Medication Sig Dispense Refill  . aspirin EC 81 MG tablet Take 1 tablet (81 mg total) by mouth daily. 90 tablet 11  . glipiZIDE (GLUCOTROL XL) 5 MG 24 hr tablet TAKE 1 TABLET (5 MG TOTAL) BY MOUTH DAILY. 90 tablet 3  . losartan (COZAAR) 25 MG tablet Take 1 tablet (25 mg total) by mouth daily. 90 tablet 3  . naproxen (NAPROSYN) 500 MG tablet Take 1 tablet (500 mg total) by mouth 2 (two) times daily with a meal. 180 tablet 1  . pioglitazone (ACTOS) 45 MG tablet Take 1 tablet (45 mg total) by mouth daily. 90 tablet 3  . rosuvastatin (CRESTOR) 40 MG tablet Take 1 tablet (40 mg total) by mouth daily. 90 tablet 3   No current facility-administered medications on file prior to visit.     Review of Systems  Constitutional: Negative for unusual diaphoresis or night sweats HENT: Negative for ear swelling or discharge Eyes: Negative for worsening visual haziness  Respiratory: Negative for choking and stridor.   Gastrointestinal: Negative for distension or worsening eructation Genitourinary: Negative for retention or change in urine volume.  Musculoskeletal: Negative for other MSK pain or swelling Skin: Negative for  color change and worsening wound Neurological: Negative for tremors and numbness other than noted  Psychiatric/Behavioral: Negative for decreased concentration or agitation other than above   All other system neg per pt    Objective:   Physical Exam BP (!) 146/82   Pulse 65   Temp 97.6 F (36.4 C)   Ht 6' (1.829 m)   Wt 279 lb (126.6 kg)   SpO2 99%   BMI 37.84 kg/m  Repeat Bp large cuff left arm - 120/84 VS noted, not ill appearing Constitutional: Pt appears in no apparent distress HENT: Head: NCAT.  Right Ear: External ear  normal.  Left Ear: External ear normal.  Bilat tm's with mild erythema.  Max sinus areas non tender.  Pharynx with mild erythema, no exudate; left ear canal clear of wax after impaction resolved with irrigation Eyes: . Pupils are equal, round, and reactive to light. Conjunctivae and EOM are normal Neck: Normal range of motion. Neck supple.  Cardiovascular: Normal rate and regular rhythm.   Pulmonary/Chest: Effort normal and breath sounds without rales or wheezing.  Neurological: Pt is alert. Not confused , motor grossly intact Skin: Skin is warm. No rash, no LE edema Psychiatric: Pt behavior is normal. No agitation. + mild depressed affect No other exam findings  Lab Results  Component Value Date   WBC 4.9 01/09/2016   HGB 13.9 01/09/2016   HCT 40.4 01/09/2016   PLT 190.0 01/09/2016   GLUCOSE 171 (H) 07/09/2016   CHOL 124 07/09/2016   TRIG 52.0 07/09/2016   HDL 43.80 07/09/2016   LDLDIRECT 105.5 02/07/2013   LDLCALC 69 07/09/2016   ALT 27 07/09/2016   AST 22 07/09/2016   NA 140 07/09/2016   K 4.0 07/09/2016   CL 107 07/09/2016   CREATININE 0.80 07/09/2016   BUN 12 07/09/2016   CO2 25 07/09/2016   TSH 1.50 01/09/2016   PSA 0.64 01/09/2016   HGBA1C 6.0 07/09/2016   MICROALBUR <0.7 01/09/2016       Assessment & Plan:

## 2016-11-08 ENCOUNTER — Other Ambulatory Visit: Payer: Self-pay | Admitting: Internal Medicine

## 2017-01-14 ENCOUNTER — Other Ambulatory Visit (INDEPENDENT_AMBULATORY_CARE_PROVIDER_SITE_OTHER): Payer: 59

## 2017-01-14 DIAGNOSIS — E119 Type 2 diabetes mellitus without complications: Secondary | ICD-10-CM | POA: Diagnosis not present

## 2017-01-14 DIAGNOSIS — Z0001 Encounter for general adult medical examination with abnormal findings: Secondary | ICD-10-CM | POA: Diagnosis not present

## 2017-01-14 LAB — BASIC METABOLIC PANEL
BUN: 16 mg/dL (ref 6–23)
CALCIUM: 8.9 mg/dL (ref 8.4–10.5)
CO2: 29 meq/L (ref 19–32)
Chloride: 104 mEq/L (ref 96–112)
Creatinine, Ser: 0.81 mg/dL (ref 0.40–1.50)
GFR: 131.84 mL/min (ref >60.00)
GLUCOSE: 182 mg/dL — AB (ref 70–99)
Potassium: 4.2 mEq/L (ref 3.5–5.1)
SODIUM: 138 meq/L (ref 135–145)

## 2017-01-14 LAB — MICROALBUMIN / CREATININE URINE RATIO
Creatinine,U: 157 mg/dL
Microalb Creat Ratio: 0.4 mg/g (ref 0.0–30.0)

## 2017-01-14 LAB — CBC WITH DIFFERENTIAL/PLATELET
BASOS PCT: 0.6 % (ref 0.0–3.0)
Basophils Absolute: 0 10*3/uL (ref 0.0–0.1)
EOS PCT: 1.3 % (ref 0.0–5.0)
Eosinophils Absolute: 0.1 10*3/uL (ref 0.0–0.7)
HEMATOCRIT: 40.1 % (ref 39.0–52.0)
HEMOGLOBIN: 13.5 g/dL (ref 13.0–17.0)
LYMPHS PCT: 46.4 % — AB (ref 12.0–46.0)
Lymphs Abs: 2.1 10*3/uL (ref 0.7–4.0)
MCHC: 33.6 g/dL (ref 30.0–36.0)
MCV: 87.6 fl (ref 78.0–100.0)
MONOS PCT: 9 % (ref 3.0–12.0)
Monocytes Absolute: 0.4 10*3/uL (ref 0.1–1.0)
Neutro Abs: 1.9 10*3/uL (ref 1.4–7.7)
Neutrophils Relative %: 42.7 % — ABNORMAL LOW (ref 43.0–77.0)
Platelets: 175 10*3/uL (ref 150.0–400.0)
RBC: 4.57 Mil/uL (ref 4.22–5.81)
RDW: 13.8 % (ref 11.5–15.5)
WBC: 4.5 10*3/uL (ref 4.0–10.5)

## 2017-01-14 LAB — HEMOGLOBIN A1C: HEMOGLOBIN A1C: 7.8 % — AB (ref 4.6–6.5)

## 2017-01-14 LAB — URINALYSIS, ROUTINE W REFLEX MICROSCOPIC
BILIRUBIN URINE: NEGATIVE
HGB URINE DIPSTICK: NEGATIVE
Ketones, ur: NEGATIVE
NITRITE: NEGATIVE
Specific Gravity, Urine: 1.01 (ref 1.000–1.030)
Total Protein, Urine: NEGATIVE
URINE GLUCOSE: NEGATIVE
Urobilinogen, UA: 0.2 (ref 0.0–1.0)
pH: 7 (ref 5.0–8.0)

## 2017-01-14 LAB — LIPID PANEL
CHOLESTEROL: 113 mg/dL (ref 0–200)
HDL: 51.7 mg/dL (ref >39.00)
LDL Cholesterol: 48 mg/dL (ref 0–99)
NonHDL: 61.44
TRIGLYCERIDES: 66 mg/dL (ref 0.0–149.0)
Total CHOL/HDL Ratio: 2
VLDL: 13.2 mg/dL (ref 0.0–40.0)

## 2017-01-14 LAB — TSH: TSH: 1.62 u[IU]/mL (ref 0.35–4.50)

## 2017-01-14 LAB — HEPATIC FUNCTION PANEL
ALBUMIN: 4 g/dL (ref 3.5–5.2)
ALT: 37 U/L (ref 0–53)
AST: 27 U/L (ref 0–37)
Alkaline Phosphatase: 58 U/L (ref 39–117)
Bilirubin, Direct: 0.1 mg/dL (ref 0.0–0.3)
TOTAL PROTEIN: 6.8 g/dL (ref 6.0–8.3)
Total Bilirubin: 0.6 mg/dL (ref 0.2–1.2)

## 2017-01-14 LAB — PSA: PSA: 0.51 ng/mL (ref 0.10–4.00)

## 2017-01-15 ENCOUNTER — Other Ambulatory Visit: Payer: 59

## 2017-01-15 ENCOUNTER — Other Ambulatory Visit: Payer: Self-pay | Admitting: Internal Medicine

## 2017-01-15 ENCOUNTER — Telehealth: Payer: Self-pay

## 2017-01-15 ENCOUNTER — Ambulatory Visit (INDEPENDENT_AMBULATORY_CARE_PROVIDER_SITE_OTHER): Payer: 59 | Admitting: Internal Medicine

## 2017-01-15 ENCOUNTER — Encounter: Payer: Self-pay | Admitting: Internal Medicine

## 2017-01-15 VITALS — BP 126/96 | HR 64 | Temp 98.4°F | Ht 72.0 in | Wt 302.0 lb

## 2017-01-15 DIAGNOSIS — I10 Essential (primary) hypertension: Secondary | ICD-10-CM | POA: Diagnosis not present

## 2017-01-15 DIAGNOSIS — Z0000 Encounter for general adult medical examination without abnormal findings: Secondary | ICD-10-CM | POA: Diagnosis not present

## 2017-01-15 DIAGNOSIS — E785 Hyperlipidemia, unspecified: Secondary | ICD-10-CM | POA: Diagnosis not present

## 2017-01-15 DIAGNOSIS — E119 Type 2 diabetes mellitus without complications: Secondary | ICD-10-CM | POA: Diagnosis not present

## 2017-01-15 DIAGNOSIS — Z114 Encounter for screening for human immunodeficiency virus [HIV]: Secondary | ICD-10-CM | POA: Diagnosis not present

## 2017-01-15 DIAGNOSIS — Z23 Encounter for immunization: Secondary | ICD-10-CM | POA: Diagnosis not present

## 2017-01-15 DIAGNOSIS — R300 Dysuria: Secondary | ICD-10-CM

## 2017-01-15 MED ORDER — METFORMIN HCL ER 500 MG PO TB24: 500.0000 mg | ORAL_TABLET | Freq: Every day | ORAL | 3 refills | Status: DC

## 2017-01-15 MED ORDER — LOSARTAN POTASSIUM 25 MG PO TABS: 25.0000 mg | ORAL_TABLET | Freq: Every day | ORAL | 3 refills | Status: DC

## 2017-01-15 NOTE — Telephone Encounter (Signed)
Pt has been notified and expressed understanding.

## 2017-01-15 NOTE — Telephone Encounter (Signed)
-----   Message from Corwin Levins, MD sent at 01/15/2017 12:45 PM EDT ----- Left message on MyChart, pt to cont same tx except  The test results show that your current treatment is OK, except the A1c is mildly elevated.  Please add and start metformin ER 500 mg  - 1 per day, follow a good diabetic diet, get regular exercise, and work on weight control.  Please continue all other medications as is.    Shirron to please inform pt, I will do rx

## 2017-01-15 NOTE — Patient Instructions (Addendum)
You had the flu shot today, and the Pneumovax pneumonia shot today  Please continue all other medications as before, and refills have been done if requested.  Please have the pharmacy call with any other refills you may need.  Please continue your efforts at being more active, low cholesterol diet, and weight control.  You are otherwise up to date with prevention measures today.  Please keep your appointments with your specialists as you may have planned  Please go to the LAB in the Basement (turn left off the elevator) for the tests to be done today  You will be contacted by phone if any changes need to be made immediately.  Otherwise, you will receive a letter about your results with an explanation, but please check with MyChart first.  Please remember to sign up for MyChart if you have not done so, as this will be important to you in the future with finding out test results, communicating by private email, and scheduling acute appointments online when needed.  Please return in 6 months, or sooner if needed, with Lab testing done 3-5 days before  

## 2017-01-15 NOTE — Progress Notes (Signed)
Subjective:    Patient ID: Dylan [name], male    DOB: May 19, 1970, 46 y.o.   MRN: [id number]  HPI  Here for wellness and f/u;  Overall doing ok;  Pt denies Chest pain, worsening SOB, DOE, wheezing, orthopnea, PND, worsening LE edema, palpitations, dizziness or syncope.  Pt denies neurological change such as new headache, facial or extremity weakness.  Pt denies polydipsia, polyuria, or low sugar symptoms. Pt states overall good compliance with treatment and medications, good tolerability, and has been trying to follow appropriate diet.  Pt denies worsening depressive symptoms, suicidal ideation or panic. No fever, night sweats, wt loss, loss of appetite, or other constitutional symptoms.  Pt states good ability with ADL's, has low fall risk, home safety reviewed and adequate, no other significant changes in hearing or vision, and only occasionally active with exercise. Does c/o some mild discomfort on urination but Denies urinary symptoms such as frequency, urgency, flank pain, hematuria or n/v, fever, chills. Wt Readings from Last 3 Encounters:  01/15/17 (!) 302 lb (137 kg)  07/10/16 279 lb (126.6 kg)  01/10/16 296 lb (134.3 kg)   BP Readings from Last 3 Encounters:  01/15/17 (!) 126/96  07/10/16 (!) 146/82  01/10/16 132/72  Not taking the losartan recently  Past Medical History:  Diagnosis Date  . ALLERGIC RHINITIS 11/25/2007  . ASTHMA 11/25/2007  . CARPAL TUNNEL SYNDROME, LEFT, MILD 11/25/2007  . DIABETES MELLITUS, TYPE II 11/25/2007  . HYPERLIPIDEMIA 11/25/2007  . NECK PAIN 08/27/2009  . Other specified forms of hearing loss 05/10/2009  . Other symptoms referable to lower leg joint 11/25/2007  . SHOULDER PAIN, LEFT 08/27/2009  . Unspecified hearing loss 04/25/2008   Past Surgical History:  Procedure Laterality Date  . s/p back surgury  2002   Lumbar disc    reports that he has never smoked. He has never used smokeless tobacco. He reports that he does not drink alcohol or use  drugs. family history includes Diabetes in his other. Allergies  Allergen Reactions  . Penicillins    Current Outpatient Prescriptions on File Prior to Visit  Medication Sig Dispense Refill  . aspirin 81 MG EC tablet Take 1 tablet (81 mg total) by mouth daily. Annual appt w/labs due in Sept must see MD for refills 90 tablet 0  . glipiZIDE (GLUCOTROL XL) 5 MG 24 hr tablet TAKE 1 TABLET (5 MG TOTAL) BY MOUTH DAILY. 90 tablet 3  . glucose blood test strip Use as instructed 100 each 12  . Lancets Misc. MISC Use as directed once daily 100 each 11  . naproxen (NAPROSYN) 500 MG tablet Take 1 tablet (500 mg total) by mouth 2 (two) times daily with a meal. 180 tablet 1  . pioglitazone (ACTOS) 30 MG tablet Take 1 tablet (30 mg total) by mouth daily. 90 tablet 3  . rosuvastatin (CRESTOR) 40 MG tablet Take 1 tablet (40 mg total) by mouth daily. 90 tablet 3   No current facility-administered medications on file prior to visit.    Review of Systems Constitutional: Negative for other unusual diaphoresis, sweats, appetite or weight changes HENT: Negative for other worsening hearing loss, ear pain, facial swelling, mouth sores or neck stiffness.   Eyes: Negative for other worsening pain, redness or other visual disturbance.  Respiratory: Negative for other stridor or swelling Cardiovascular: Negative for other palpitations or other chest pain  Gastrointestinal: Negative for worsening diarrhea or loose stools, blood in stool, distention or other pain Genitourinary: Negative for hematuria,  flank pain or other change in urine volume.  Musculoskeletal: Negative for myalgias or other joint swelling.  Skin: Negative for other color change, or other wound or worsening drainage.  Neurological: Negative for other syncope or numbness. Hematological: Negative for other adenopathy or swelling Psychiatric/Behavioral: Negative for hallucinations, other worsening agitation, SI, self-injury, or new decreased  concentration All other system neg per pt    Objective:   Physical Exam BP (!) 126/96   Pulse 64   Temp 98.4 F (36.9 C) (Oral)   Ht 6' (1.829 m)   Wt (!) 302 lb (137 kg)   SpO2 100%   BMI 40.96 kg/m  VS noted,  Constitutional: Pt is oriented to person, place, and time. Appears well-developed and well-nourished, in no significant distress and comfortable Head: Normocephalic and atraumatic  Eyes: Conjunctivae and EOM are normal. Pupils are equal, round, and reactive to light Right Ear: External ear normal without discharge Left Ear: External ear normal without discharge Nose: Nose without discharge or deformity Mouth/Throat: Oropharynx is without other ulcerations and moist  Neck: Normal range of motion. Neck supple. No JVD present. No tracheal deviation present or significant neck LA or mass Cardiovascular: Normal rate, regular rhythm, normal heart sounds and intact distal pulses.   Pulmonary/Chest: WOB normal and breath sounds without rales or wheezing  Abdominal: Soft. Bowel sounds are normal. NT. No HSM  Musculoskeletal: Normal range of motion. Exhibits no edema Lymphadenopathy: Has no other cervical adenopathy.  Neurological: Pt is alert and oriented to person, place, and time. Pt has normal reflexes. No cranial nerve deficit. Motor grossly intact, Gait intact Skin: Skin is warm and dry. No rash noted or new ulcerations Psychiatric:  Has normal mood and affect. Behavior is normal without agitation No other exam findings Lab Results  Component Value Date   WBC 4.5 01/14/2017   HGB 13.5 01/14/2017   HCT 40.1 01/14/2017   PLT 175.0 01/14/2017   GLUCOSE 182 (H) 01/14/2017   CHOL 113 01/14/2017   TRIG 66.0 01/14/2017   HDL 51.70 01/14/2017   LDLDIRECT 105.5 02/07/2013   LDLCALC 48 01/14/2017   ALT 37 01/14/2017   AST 27 01/14/2017   NA 138 01/14/2017   K 4.2 01/14/2017   CL 104 01/14/2017   CREATININE 0.81 01/14/2017   BUN 16 01/14/2017   CO2 29 01/14/2017   TSH  1.62 01/14/2017   PSA 0.51 01/14/2017   HGBA1C 7.8 (H) 01/14/2017   MICROALBUR <0.7 01/14/2017         Assessment & Plan:

## 2017-01-16 LAB — HIV ANTIBODY (ROUTINE TESTING W REFLEX): HIV 1&2 Ab, 4th Generation: NONREACTIVE

## 2017-01-17 LAB — URINE CULTURE
MICRO NUMBER:: 81047492
SPECIMEN QUALITY: ADEQUATE

## 2017-01-17 NOTE — Assessment & Plan Note (Signed)
stable overall by history and exam, recent data reviewed with pt, and pt to continue medical treatment as before,  to f/u any worsening symptoms or concerns Lab Results  Component Value Date   LDLCALC 48 01/14/2017

## 2017-01-17 NOTE — Assessment & Plan Note (Signed)
stable overall by history and exam, recent data reviewed with pt, and pt to continue medical treatment as before,  to f/u any worsening symptoms or concerns BP Readings from Last 3 Encounters:  01/15/17 (!) 126/96  07/10/16 (!) 146/82  01/10/16 132/72

## 2017-01-17 NOTE — Assessment & Plan Note (Signed)
stable overall by history and exam, recent data reviewed with pt, and pt to continue medical treatment as before,  to f/u any worsening symptoms or concerns, for f/u a1c 

## 2017-01-17 NOTE — Assessment & Plan Note (Signed)
For urine cx, exam benign

## 2017-01-17 NOTE — Assessment & Plan Note (Signed)

## 2017-02-03 ENCOUNTER — Other Ambulatory Visit: Payer: Self-pay | Admitting: Internal Medicine

## 2017-03-02 ENCOUNTER — Other Ambulatory Visit: Payer: Self-pay | Admitting: Internal Medicine

## 2017-03-04 ENCOUNTER — Other Ambulatory Visit: Payer: Self-pay

## 2017-03-04 MED ORDER — GLUCOSE BLOOD VI STRP: ORAL_STRIP | 12 refills | Status: DC

## 2017-04-07 ENCOUNTER — Other Ambulatory Visit: Payer: Self-pay

## 2017-04-07 ENCOUNTER — Inpatient Hospital Stay (HOSPITAL_COMMUNITY)
Admission: EM | Admit: 2017-04-07 | Discharge: 2017-04-11 | DRG: 481 | Disposition: A | Discharge status: Skilled Nursing Facility | Payer: 59 | Attending: Orthopedic Surgery | Admitting: Orthopedic Surgery

## 2017-04-07 ENCOUNTER — Emergency Department (HOSPITAL_COMMUNITY): Payer: 59

## 2017-04-07 ENCOUNTER — Encounter (HOSPITAL_COMMUNITY): Payer: Self-pay

## 2017-04-07 DIAGNOSIS — S199XXA Unspecified injury of neck, initial encounter: Secondary | ICD-10-CM | POA: Diagnosis not present

## 2017-04-07 DIAGNOSIS — W000XXA Fall on same level due to ice and snow, initial encounter: Secondary | ICD-10-CM | POA: Diagnosis not present

## 2017-04-07 DIAGNOSIS — E119 Type 2 diabetes mellitus without complications: Secondary | ICD-10-CM

## 2017-04-07 DIAGNOSIS — D62 Acute posthemorrhagic anemia: Secondary | ICD-10-CM | POA: Diagnosis not present

## 2017-04-07 DIAGNOSIS — T148XXA Other injury of unspecified body region, initial encounter: Secondary | ICD-10-CM | POA: Diagnosis not present

## 2017-04-07 DIAGNOSIS — S72322D Displaced transverse fracture of shaft of left femur, subsequent encounter for closed fracture with routine healing: Secondary | ICD-10-CM | POA: Diagnosis not present

## 2017-04-07 DIAGNOSIS — S728X9A Other fracture of unspecified femur, initial encounter for closed fracture: Secondary | ICD-10-CM | POA: Diagnosis not present

## 2017-04-07 DIAGNOSIS — Z4789 Encounter for other orthopedic aftercare: Secondary | ICD-10-CM | POA: Diagnosis not present

## 2017-04-07 DIAGNOSIS — S7292XA Unspecified fracture of left femur, initial encounter for closed fracture: Secondary | ICD-10-CM | POA: Diagnosis not present

## 2017-04-07 DIAGNOSIS — M84452A Pathological fracture, left femur, initial encounter for fracture: Secondary | ICD-10-CM | POA: Diagnosis not present

## 2017-04-07 DIAGNOSIS — E8881 Metabolic syndrome: Secondary | ICD-10-CM | POA: Diagnosis not present

## 2017-04-07 DIAGNOSIS — S7290XA Unspecified fracture of unspecified femur, initial encounter for closed fracture: Secondary | ICD-10-CM | POA: Diagnosis present

## 2017-04-07 DIAGNOSIS — J45909 Unspecified asthma, uncomplicated: Secondary | ICD-10-CM | POA: Diagnosis present

## 2017-04-07 DIAGNOSIS — Z6839 Body mass index (BMI) 39.0-39.9, adult: Secondary | ICD-10-CM

## 2017-04-07 DIAGNOSIS — E559 Vitamin D deficiency, unspecified: Secondary | ICD-10-CM | POA: Diagnosis not present

## 2017-04-07 DIAGNOSIS — Z79899 Other long term (current) drug therapy: Secondary | ICD-10-CM

## 2017-04-07 DIAGNOSIS — I10 Essential (primary) hypertension: Secondary | ICD-10-CM | POA: Diagnosis present

## 2017-04-07 DIAGNOSIS — E785 Hyperlipidemia, unspecified: Secondary | ICD-10-CM | POA: Diagnosis present

## 2017-04-07 DIAGNOSIS — Z01818 Encounter for other preprocedural examination: Secondary | ICD-10-CM | POA: Diagnosis not present

## 2017-04-07 DIAGNOSIS — Z7984 Long term (current) use of oral hypoglycemic drugs: Secondary | ICD-10-CM

## 2017-04-07 DIAGNOSIS — Z419 Encounter for procedure for purposes other than remedying health state, unspecified: Secondary | ICD-10-CM

## 2017-04-07 DIAGNOSIS — E669 Obesity, unspecified: Secondary | ICD-10-CM | POA: Diagnosis present

## 2017-04-07 DIAGNOSIS — Z09 Encounter for follow-up examination after completed treatment for conditions other than malignant neoplasm: Secondary | ICD-10-CM

## 2017-04-07 DIAGNOSIS — S72302A Unspecified fracture of shaft of left femur, initial encounter for closed fracture: Secondary | ICD-10-CM | POA: Diagnosis not present

## 2017-04-07 DIAGNOSIS — M6281 Muscle weakness (generalized): Secondary | ICD-10-CM | POA: Diagnosis not present

## 2017-04-07 DIAGNOSIS — G8911 Acute pain due to trauma: Secondary | ICD-10-CM | POA: Diagnosis not present

## 2017-04-07 DIAGNOSIS — M79605 Pain in left leg: Secondary | ICD-10-CM | POA: Diagnosis not present

## 2017-04-07 DIAGNOSIS — S72322A Displaced transverse fracture of shaft of left femur, initial encounter for closed fracture: Secondary | ICD-10-CM | POA: Diagnosis not present

## 2017-04-07 HISTORY — DX: Obesity, unspecified: E66.9

## 2017-04-07 HISTORY — DX: Vitamin D deficiency, unspecified: E55.9

## 2017-04-07 HISTORY — DX: Unspecified fracture of left femur, initial encounter for closed fracture: S72.92XA

## 2017-04-07 HISTORY — DX: Unspecified asthma, uncomplicated: J45.909

## 2017-04-07 HISTORY — DX: Metabolic syndrome: E88.81

## 2017-04-07 LAB — BASIC METABOLIC PANEL
ANION GAP: 8 (ref 5–15)
BUN: 10 mg/dL (ref 6–20)
CHLORIDE: 103 mmol/L (ref 101–111)
CO2: 25 mmol/L (ref 22–32)
Calcium: 8.6 mg/dL — ABNORMAL LOW (ref 8.9–10.3)
Creatinine, Ser: 0.72 mg/dL (ref 0.61–1.24)
GFR calc non Af Amer: >60 mL/min (ref >60)
Glucose, Bld: 303 mg/dL — ABNORMAL HIGH (ref 65–99)
Potassium: 3.7 mmol/L (ref 3.5–5.1)
Sodium: 136 mmol/L (ref 135–145)

## 2017-04-07 LAB — CBG MONITORING, ED: Glucose-Capillary: 352 mg/dL — ABNORMAL HIGH (ref 65–99)

## 2017-04-07 LAB — CBC WITH DIFFERENTIAL/PLATELET
BASOS ABS: 0 10*3/uL (ref 0.0–0.1)
BASOS PCT: 0 %
Eosinophils Absolute: 0 10*3/uL (ref 0.0–0.7)
Eosinophils Relative: 0 %
HEMATOCRIT: 38.1 % — AB (ref 39.0–52.0)
HEMOGLOBIN: 13.2 g/dL (ref 13.0–17.0)
Lymphocytes Relative: 16 %
Lymphs Abs: 1 10*3/uL (ref 0.7–4.0)
MCH: 29.2 pg (ref 26.0–34.0)
MCHC: 34.6 g/dL (ref 30.0–36.0)
MCV: 84.3 fL (ref 78.0–100.0)
MONOS PCT: 3 %
Monocytes Absolute: 0.2 10*3/uL (ref 0.1–1.0)
NEUTROS ABS: 5.3 10*3/uL (ref 1.7–7.7)
NEUTROS PCT: 81 %
Platelets: 166 10*3/uL (ref 150–400)
RBC: 4.52 MIL/uL (ref 4.22–5.81)
RDW: 13.4 % (ref 11.5–15.5)
WBC: 6.6 10*3/uL (ref 4.0–10.5)

## 2017-04-07 LAB — PROTIME-INR
INR: 1.03
Prothrombin Time: 13.5 seconds (ref 11.4–15.2)

## 2017-04-07 LAB — TSH: TSH: 1.091 u[IU]/mL (ref 0.350–4.500)

## 2017-04-07 LAB — HEMOGLOBIN A1C
Hgb A1c MFr Bld: 9.9 % — ABNORMAL HIGH (ref 4.8–5.6)
MEAN PLASMA GLUCOSE: 237.43 mg/dL

## 2017-04-07 LAB — PHOSPHORUS: Phosphorus: 2.8 mg/dL (ref 2.5–4.6)

## 2017-04-07 LAB — PREALBUMIN: PREALBUMIN: 20.5 mg/dL (ref 18–38)

## 2017-04-07 LAB — MAGNESIUM: Magnesium: 1.8 mg/dL (ref 1.7–2.4)

## 2017-04-07 MED ORDER — CHLORHEXIDINE GLUCONATE 4 % EX LIQD
60.0000 mL | Freq: Once | CUTANEOUS | Status: AC
  Administered 2017-04-08: 4 via TOPICAL
  Filled 2017-04-07: qty 60

## 2017-04-07 MED ORDER — POVIDONE-IODINE 10 % EX SWAB: 2.0000 "application " | Freq: Once | CUTANEOUS | Status: DC

## 2017-04-07 MED ORDER — SODIUM CHLORIDE 0.9 % IV BOLUS (SEPSIS)
500.0000 mL | Freq: Once | INTRAVENOUS | Status: AC
  Administered 2017-04-07: 500 mL via INTRAVENOUS

## 2017-04-07 MED ORDER — DOCUSATE SODIUM 100 MG PO CAPS
100.0000 mg | ORAL_CAPSULE | Freq: Two times a day (BID) | ORAL | Status: DC
  Administered 2017-04-07 – 2017-04-11 (×7): 100 mg via ORAL
  Filled 2017-04-07 (×7): qty 1

## 2017-04-07 MED ORDER — ONDANSETRON HCL 4 MG PO TABS: 4.0000 mg | ORAL_TABLET | Freq: Four times a day (QID) | ORAL | Status: DC | PRN

## 2017-04-07 MED ORDER — INSULIN ASPART 100 UNIT/ML ~~LOC~~ SOLN: 0.0000 [IU] | Freq: Every day | SUBCUTANEOUS | Status: DC

## 2017-04-07 MED ORDER — ACETAMINOPHEN 500 MG PO TABS
1000.0000 mg | ORAL_TABLET | Freq: Four times a day (QID) | ORAL | Status: DC
  Administered 2017-04-07: 1000 mg via ORAL
  Filled 2017-04-07: qty 2

## 2017-04-07 MED ORDER — METHOCARBAMOL 750 MG PO TABS
750.0000 mg | ORAL_TABLET | Freq: Four times a day (QID) | ORAL | Status: DC
  Administered 2017-04-07 – 2017-04-11 (×13): 750 mg via ORAL
  Filled 2017-04-07 (×13): qty 1

## 2017-04-07 MED ORDER — MORPHINE SULFATE (PF) 4 MG/ML IV SOLN
4.0000 mg | Freq: Once | INTRAVENOUS | Status: AC
  Administered 2017-04-07: 4 mg via INTRAVENOUS
  Filled 2017-04-07: qty 1

## 2017-04-07 MED ORDER — DEXTROSE 5 % IV SOLN
3.0000 g | INTRAVENOUS | Status: AC
  Administered 2017-04-08: 3 g via INTRAVENOUS
  Filled 2017-04-07: qty 3

## 2017-04-07 MED ORDER — ENOXAPARIN SODIUM 40 MG/0.4ML ~~LOC~~ SOLN: 40.0000 mg | SUBCUTANEOUS | Status: DC

## 2017-04-07 MED ORDER — METHOCARBAMOL 1000 MG/10ML IJ SOLN
500.0000 mg | Freq: Four times a day (QID) | INTRAVENOUS | Status: DC | PRN
  Filled 2017-04-07: qty 5

## 2017-04-07 MED ORDER — ACETAMINOPHEN 325 MG PO TABS: 650.0000 mg | ORAL_TABLET | Freq: Four times a day (QID) | ORAL | Status: DC | PRN

## 2017-04-07 MED ORDER — INSULIN ASPART 100 UNIT/ML ~~LOC~~ SOLN: 0.0000 [IU] | Freq: Three times a day (TID) | SUBCUTANEOUS | Status: DC

## 2017-04-07 MED ORDER — METHOCARBAMOL 500 MG PO TABS: 500.0000 mg | ORAL_TABLET | Freq: Four times a day (QID) | ORAL | Status: DC | PRN

## 2017-04-07 MED ORDER — ACETAMINOPHEN 650 MG RE SUPP: 650.0000 mg | Freq: Four times a day (QID) | RECTAL | Status: DC

## 2017-04-07 MED ORDER — POTASSIUM CHLORIDE IN NACL 20-0.45 MEQ/L-% IV SOLN: INTRAVENOUS | Status: DC

## 2017-04-07 MED ORDER — SODIUM CHLORIDE 0.9 % IV SOLN
INTRAVENOUS | Status: DC
  Administered 2017-04-07: 17:00:00 via INTRAVENOUS

## 2017-04-07 MED ORDER — DEXTROSE 5 % IV SOLN
500.0000 mg | Freq: Four times a day (QID) | INTRAVENOUS | Status: DC
  Administered 2017-04-11: 500 mg via INTRAVENOUS
  Filled 2017-04-07 (×17): qty 5

## 2017-04-07 MED ORDER — ONDANSETRON HCL 4 MG/2ML IJ SOLN
4.0000 mg | Freq: Four times a day (QID) | INTRAMUSCULAR | Status: DC | PRN
  Administered 2017-04-08: 4 mg via INTRAVENOUS
  Filled 2017-04-07: qty 2

## 2017-04-07 MED ORDER — HYDRALAZINE HCL 20 MG/ML IJ SOLN: 10.0000 mg | Freq: Four times a day (QID) | INTRAMUSCULAR | Status: DC | PRN

## 2017-04-07 MED ORDER — INSULIN ASPART 100 UNIT/ML ~~LOC~~ SOLN
1.0000 [IU] | Freq: Two times a day (BID) | SUBCUTANEOUS | Status: DC
  Administered 2017-04-08: 5 [IU] via SUBCUTANEOUS

## 2017-04-07 MED ORDER — INSULIN ASPART 100 UNIT/ML ~~LOC~~ SOLN
7.0000 [IU] | Freq: Once | SUBCUTANEOUS | Status: AC
  Administered 2017-04-07: 7 [IU] via SUBCUTANEOUS
  Filled 2017-04-07: qty 1

## 2017-04-07 MED ORDER — HYDROMORPHONE HCL 1 MG/ML IJ SOLN
0.5000 mg | INTRAMUSCULAR | Status: DC | PRN
  Administered 2017-04-07 – 2017-04-08 (×3): 0.5 mg via INTRAVENOUS
  Filled 2017-04-07 (×3): qty 1

## 2017-04-07 MED ORDER — OXYCODONE HCL 5 MG PO TABS
5.0000 mg | ORAL_TABLET | ORAL | Status: DC | PRN
  Administered 2017-04-07 (×2): 15 mg via ORAL
  Administered 2017-04-08: 10 mg via ORAL
  Administered 2017-04-08: 15 mg via ORAL
  Administered 2017-04-08: 10 mg via ORAL
  Administered 2017-04-09 – 2017-04-11 (×13): 15 mg via ORAL
  Filled 2017-04-07 (×2): qty 3
  Filled 2017-04-07: qty 2
  Filled 2017-04-07 (×7): qty 3
  Filled 2017-04-07: qty 2
  Filled 2017-04-07 (×7): qty 3

## 2017-04-07 MED ORDER — ACETAMINOPHEN 650 MG RE SUPP: 650.0000 mg | Freq: Four times a day (QID) | RECTAL | Status: DC | PRN

## 2017-04-07 NOTE — ED Triage Notes (Signed)
Pt fall from standing on ice with c/o pain at left femur and tib/fib. Arrives EMS with long leg blue splint in place. Pt given 200 mcg fentanylPTA. Denies LOC.

## 2017-04-07 NOTE — Consult Note (Signed)
Reason for Consult:Left femur fx Referring Physician: Camille BalJ [name]  Dylan [name] is an 46 y.o. male with DM, somewhat poorly controlled by his admission. HPI: T was in his driveway and slipped on the ice. He felt like his leg gave way during the fall and had immediate thigh pain and could not get up. He was transported via EMS to the ED where x-rays showed a midshaft femur fx and orthopedic surgery was consulted. 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 He was actually on his way to see an orthopedist for recent undiagnosed knee pain.  Past Medical History:  Diagnosis Date  . ALLERGIC RHINITIS 11/25/2007  . ASTHMA 11/25/2007  . Asthma   . CARPAL TUNNEL SYNDROME, LEFT, MILD 11/25/2007  . DIABETES MELLITUS, TYPE II 11/25/2007  . HYPERLIPIDEMIA 11/25/2007  . NECK PAIN 08/27/2009  . Other specified forms of hearing loss 05/10/2009  . Other symptoms referable to lower leg joint 11/25/2007  . SHOULDER PAIN, LEFT 08/27/2009  . Unspecified hearing loss 04/25/2008    Past Surgical History:  Procedure Laterality Date  . BACK SURGERY    . s/p back surgury  2002   Lumbar disc    Family History  Problem Relation Age of Onset  . Diabetes Other     Social History:  reports that  has never smoked. he has never used smokeless tobacco. He reports that he does not drink alcohol or use drugs.  Allergies:  Allergies  Allergen Reactions  . Penicillins     Medications: I have reviewed the patient's current medications.  Results for orders placed or performed during the hospital encounter of 04/07/17 (from the past 48 hour(s))  CBC with Differential     Status: Abnormal   Collection Time: 04/07/17  1:14 PM  Result Value Ref Range   WBC 6.6 4.0 - 10.5 K/uL   RBC 4.52 4.22 - 5.81 MIL/uL   Hemoglobin 13.2 13.0 - 17.0 g/dL   HCT 16.138.1 (L) 09.639.0 - 04.552.0 %   MCV 84.3 78.0 - 100.0 fL   MCH 29.2 26.0 - 34.0 pg   MCHC 34.6 30.0 - 36.0 g/dL   RDW 40.913.4 81.111.5 - 91.415.5 %   Platelets 166 150 - 400 K/uL   Neutrophils  Relative % 81 %   Neutro Abs 5.3 1.7 - 7.7 K/uL   Lymphocytes Relative 16 %   Lymphs Abs 1.0 0.7 - 4.0 K/uL   Monocytes Relative 3 %   Monocytes Absolute 0.2 0.1 - 1.0 K/uL   Eosinophils Relative 0 %   Eosinophils Absolute 0.0 0.0 - 0.7 K/uL   Basophils Relative 0 %   Basophils Absolute 0.0 0.0 - 0.1 K/uL    Dg Tibia/fibula Left  Result Date: 04/07/2017 CLINICAL DATA:  Fall.  Left leg pain. EXAM: LEFT TIBIA AND FIBULA - 2 VIEW COMPARISON:  None. FINDINGS: Metallic foreign body noted in the lateral proximal calf soft tissues. No underlying bony abnormality. No fracture, subluxation or dislocation. Knee joint and ankle joint are maintained. No joint effusion. IMPRESSION: No acute bony abnormality. Electronically Signed   By: Charlett NoseKevin  Dover M.D.   On: 04/07/2017 12:26   Ct Cervical Spine Wo Contrast  Result Date: 04/07/2017 CLINICAL DATA:  Status post fall EXAM: CT CERVICAL SPINE WITHOUT CONTRAST TECHNIQUE: Multidetector CT imaging of the cervical spine was performed without intravenous contrast. Multiplanar CT image reconstructions were also generated. COMPARISON:  None. FINDINGS: Alignment: Normal. Skull base and vertebrae: No acute fracture. No primary bone lesion or focal  pathologic process. Soft tissues and spinal canal: No prevertebral fluid or swelling. No visible canal hematoma. Disc levels: Disc spaces are maintained. Broad-based disc bulge at C3-4. No foraminal or central canal stenosis. Upper chest: Lung apices are clear. Other: No fluid collection or hematoma. IMPRESSION: 1.  No acute osseous injury of the cervical spine. Electronically Signed   By: Elige KoHetal  Patel   On: 04/07/2017 12:48   Dg Femur Min 2 Views Left  Result Date: 04/07/2017 CLINICAL DATA:  Fall.  Left femur pain EXAM: LEFT FEMUR 2 VIEWS COMPARISON:  None. FINDINGS: There is a transverse fracture through the left femoral distal shaft. Approximately 1 shaft with lateral displacement of the distal fragments. IMPRESSION:  Displaced transverse fracture through the distal left femoral shaft. Electronically Signed   By: Charlett NoseKevin  Dover M.D.   On: 04/07/2017 12:26    Review of Systems  Constitutional: Negative for weight loss.  HENT: Negative for ear discharge, ear pain, hearing loss and tinnitus.   Eyes: Negative for blurred vision, double vision, photophobia and pain.  Respiratory: Negative for cough, sputum production and shortness of breath.   Cardiovascular: Negative for chest pain.  Gastrointestinal: Negative for abdominal pain, nausea and vomiting.  Genitourinary: Negative for dysuria, flank pain, frequency and urgency.  Musculoskeletal: Positive for joint pain (Left thigh). Negative for back pain, falls, myalgias and neck pain.  Neurological: Negative for dizziness, tingling, sensory change, focal weakness, loss of consciousness and headaches.  Endo/Heme/Allergies: Does not bruise/bleed easily.  Psychiatric/Behavioral: Negative for depression, memory loss and substance abuse. The patient is not nervous/anxious.    Blood pressure 119/67, pulse 62, temperature 97.9 F (36.6 C), temperature source Oral, resp. rate 18, height 6' (1.829 m), weight 131.5 kg (290 lb), SpO2 99 %. Physical Exam  Constitutional: He appears well-developed and well-nourished. No distress.  HENT:  Head: Normocephalic.  Eyes: Conjunctivae are normal. Right eye exhibits no discharge. Left eye exhibits no discharge. No scleral icterus.  Neck: Normal range of motion.  Cardiovascular: Normal rate and regular rhythm.  Respiratory: Effort normal. No respiratory distress.  Musculoskeletal:  LLE No traumatic wounds, ecchymosis, or rash  TTP thigh  No knee or ankle effusion  Knee stable to varus/ valgus and anterior/posterior stress  Sens DPN, SPN, TN intact  Motor EHL, ext, flex, evers 5/5  DP 2+, PT 2+, No significant edema  Neurological: He is alert.  Skin: Skin is warm and dry. He is not diaphoretic.  Psychiatric: He has a normal  mood and affect. His behavior is normal.    Assessment/Plan: Fall Right femur fx -- For IMN tomorrow morning by Dr. Carola FrostHandy. NPO after MN. Buck's traction until then. DM -- Will ask IM to admit and manage glucose control. Will order A1c in anticipation.    Dylan CaldronMichael Dylan. Linn Clavin, PA-C Orthopedic Surgery [phone] 04/07/2017, 1:54 PM

## 2017-04-07 NOTE — ED Notes (Signed)
Pt returns from xray and immediately transports to CT.

## 2017-04-07 NOTE — ED Provider Notes (Signed)
[facility]   CSN: [id number] Arrival date & time: 04/07/17  1106     History   Chief Complaint Chief Complaint  Patient presents with  . Fall  . Leg Injury    HPI Dylan [name] is a 46 y.o. male BIB EMS who presents for evaluation of left lower leg pain after mechanical fall that occurred approximately 9:45 PM this morning.  The patient states that he was walking when he slipped on ice, causing him to fall.  Patient states that it felt like his left leg completely gave out on him.  He states that he was unable to get up and ambulate after the incident.  He was assisted to the side of the street by a onlooker.  EMS was called and patient was placed in a lower leg splint.  He got 200 mcg of fentanyl in route which improved the pain.  Patient states that he did not hit his head and denies any.  Patient denies any chest pain, difficulty breathing, abdominal pain, nausea/vomiting.  The history is provided by the patient.    Past Medical History:  Diagnosis Date  . ALLERGIC RHINITIS 11/25/2007  . ASTHMA 11/25/2007  . Asthma   . CARPAL TUNNEL SYNDROME, LEFT, MILD 11/25/2007  . DIABETES MELLITUS, TYPE II 11/25/2007  . HYPERLIPIDEMIA 11/25/2007  . NECK PAIN 08/27/2009  . Obesity   . Other specified forms of hearing loss 05/10/2009  . Other symptoms referable to lower leg joint 11/25/2007  . SHOULDER PAIN, LEFT 08/27/2009  . Unspecified hearing loss 04/25/2008    Patient Active Problem List   Diagnosis Date Noted  . Femur fracture, left (HCC) 04/07/2017  . Obesity   . Dysuria 01/15/2017  . Mood altered 07/10/2016  . Left facial swelling 01/10/2016  . Bilateral knee pain 07/05/2015  . Psychosis (HCC) 02/07/2013  . Diabetes (HCC) 02/07/2013  . Hearing loss, left 02/07/2013  . Bilateral hearing loss 07/21/2012  . Preventative health care 12/23/2010  . SHOULDER PAIN, LEFT 08/27/2009  . NECK PAIN 08/27/2009  . Other specified  forms of hearing loss 05/10/2009  . Essential hypertension 10/25/2008  . Hyperlipidemia 11/25/2007  . CARPAL TUNNEL SYNDROME, LEFT, MILD 11/25/2007  . ALLERGIC RHINITIS 11/25/2007  . Asthma 11/25/2007  . Other symptoms referable to lower leg joint 11/25/2007    Past Surgical History:  Procedure Laterality Date  . BACK SURGERY    . s/p back surgury  2002   Lumbar disc       Home Medications    Prior to Admission medications   Medication Sig Start Date End Date Taking? Authorizing Provider  aspirin 81 MG EC tablet Take 1 tablet (81 mg total) by mouth daily. Annual appt w/labs due in Sept must see MD for refills 11/09/16   Corwin LevinsJohn, [name], MD  glipiZIDE (GLUCOTROL XL) 5 MG 24 hr tablet TAKE 1 TABLET (5 MG TOTAL) BY MOUTH DAILY. 01/10/16   Corwin LevinsJohn, [name], MD  glucose blood test strip Use as instructed E11.0 03/04/17   Corwin LevinsJohn, [name], MD  Lancets Misc. MISC Use as directed once daily 07/10/16   Corwin LevinsJohn, [name], MD  losartan (COZAAR) 25 MG tablet Take 1 tablet (25 mg total) by mouth daily. 01/15/17   Corwin LevinsJohn, [name], MD  losartan (COZAAR) 25 MG tablet TAKE 1 TABLET BY MOUTH EVERY DAY 02/03/17   Corwin LevinsJohn, [name], MD  metFORMIN (GLUCOPHAGE-XR) 500 MG 24 hr tablet Take 1 tablet (500 mg  total) by mouth daily with breakfast. 01/15/17   Corwin Levins, MD  naproxen (NAPROSYN) 500 MG tablet TAKE 1 TABLET BY MOUTH TWICE A DAY WITH A MEAL 03/02/17   Corwin Levins, MD  pioglitazone (ACTOS) 30 MG tablet Take 1 tablet (30 mg total) by mouth daily. 07/10/16   Corwin Levins, MD  rosuvastatin (CRESTOR) 40 MG tablet TAKE 1 TABLET (40 MG TOTAL) BY MOUTH DAILY. 02/03/17   Corwin Levins, MD    Family History Family History  Problem Relation Age of Onset  . Diabetes Other     Social History Social History   Tobacco Use  . Smoking status: Never Smoker  . Smokeless tobacco: Never Used  Substance Use Topics  . Alcohol use: No  . Drug use: No     Allergies   Penicillins   Review of Systems Review of Systems    Respiratory: Negative for cough and shortness of breath.   Cardiovascular: Negative for chest pain.  Gastrointestinal: Negative for abdominal pain, diarrhea, nausea and vomiting.  Musculoskeletal: Negative for back pain.       LLE pain  Neurological: Negative for weakness and numbness.  Psychiatric/Behavioral: Negative for confusion.     Physical Exam Updated Vital Signs BP 132/78   Pulse 80   Temp 97.9 F (36.6 C) (Oral)   Resp 15   Ht 6' (1.829 m)   Wt 131.5 kg (290 lb)   SpO2 100%   BMI 39.33 kg/m   Physical Exam  Constitutional: He is oriented to person, place, and time. He appears well-developed and well-nourished.  Sitting comfortably on examination table  HENT:  Head: Normocephalic and atraumatic.  Mouth/Throat: Oropharynx is clear and moist and mucous membranes are normal.  Eyes: Conjunctivae, EOM and lids are normal. Pupils are equal, round, and reactive to light.  Neck: Normal range of motion.  Patient with c-collar in place.  Good range of motion, though limited extension of back secondary to position.  Form is or crepitus noted.  Cardiovascular: Normal rate, regular rhythm, normal heart sounds and normal pulses.  Pulses:      Dorsalis pedis pulses are 2+ on the right side, and 2+ on the left side.  Pulmonary/Chest: Effort normal and breath sounds normal.  Abdominal: Soft. Normal appearance. There is no tenderness. There is no rigidity and no guarding.  Musculoskeletal: Normal range of motion.  Diffuse tenderness palpation overlying the anterior aspect of the left femur, left knee, left tib-fib.  Tenderness palpation to the lateral malleolus of the left ankle.  Limited range of motion of left lower extremity secondary to pain.  Splint in place.  No abnormalities of the right lower extremity.  Neurological: He is alert and oriented to person, place, and time.  Sensation intact along major nerve distributions of BLE  Skin: Skin is warm and dry. Capillary refill  takes less than 2 seconds.  LLE is not dusky in appearance or cool to touch.  Psychiatric: He has a normal mood and affect. His speech is normal.  Nursing note and vitals reviewed.    ED Treatments / Results  Labs (all labs ordered are listed, but only abnormal results are displayed) Labs Reviewed  CBC WITH DIFFERENTIAL/PLATELET - Abnormal; Notable for the following components:      Result Value   HCT 38.1 (*)    All other components within normal limits  BASIC METABOLIC PANEL - Abnormal; Notable for the following components:   Glucose, Bld 303 (*)  Calcium 8.6 (*)    All other components within normal limits  HEMOGLOBIN A1C  PROTIME-INR    EKG  EKG Interpretation None       Radiology Dg Tibia/fibula Left  Result Date: 04/07/2017 CLINICAL DATA:  Fall.  Left leg pain. EXAM: LEFT TIBIA AND FIBULA - 2 VIEW COMPARISON:  None. FINDINGS: Metallic foreign body noted in the lateral proximal calf soft tissues. No underlying bony abnormality. No fracture, subluxation or dislocation. Knee joint and ankle joint are maintained. No joint effusion. IMPRESSION: No acute bony abnormality. Electronically Signed   By: Charlett NoseKevin  Dover M.D.   On: 04/07/2017 12:26   Ct Cervical Spine Wo Contrast  Result Date: 04/07/2017 CLINICAL DATA:  Status post fall EXAM: CT CERVICAL SPINE WITHOUT CONTRAST TECHNIQUE: Multidetector CT imaging of the cervical spine was performed without intravenous contrast. Multiplanar CT image reconstructions were also generated. COMPARISON:  None. FINDINGS: Alignment: Normal. Skull base and vertebrae: No acute fracture. No primary bone lesion or focal pathologic process. Soft tissues and spinal canal: No prevertebral fluid or swelling. No visible canal hematoma. Disc levels: Disc spaces are maintained. Broad-based disc bulge at C3-4. No foraminal or central canal stenosis. Upper chest: Lung apices are clear. Other: No fluid collection or hematoma. IMPRESSION: 1.  No acute osseous  injury of the cervical spine. Electronically Signed   By: Elige KoHetal  Patel   On: 04/07/2017 12:48   Dg Femur Min 2 Views Left  Result Date: 04/07/2017 CLINICAL DATA:  Fall.  Left femur pain EXAM: LEFT FEMUR 2 VIEWS COMPARISON:  None. FINDINGS: There is a transverse fracture through the left femoral distal shaft. Approximately 1 shaft with lateral displacement of the distal fragments. IMPRESSION: Displaced transverse fracture through the distal left femoral shaft. Electronically Signed   By: Charlett NoseKevin  Dover M.D.   On: 04/07/2017 12:26    Procedures Procedures (including critical care time)  Medications Ordered in ED Medications  insulin aspart (novoLOG) injection 0-15 Units (not administered)  insulin aspart (novoLOG) injection 0-5 Units (not administered)  insulin aspart (novoLOG) injection 7 Units (not administered)  acetaminophen (TYLENOL) tablet 650 mg (not administered)    Or  acetaminophen (TYLENOL) suppository 650 mg (not administered)  enoxaparin (LOVENOX) injection 40 mg (not administered)  0.45 % NaCl with KCl 20 mEq / L infusion (not administered)  ondansetron (ZOFRAN) tablet 4 mg (not administered)    Or  ondansetron (ZOFRAN) injection 4 mg (not administered)  docusate sodium (COLACE) capsule 100 mg (not administered)  methocarbamol (ROBAXIN) tablet 500 mg (not administered)    Or  methocarbamol (ROBAXIN) 500 mg in dextrose 5 % 50 mL IVPB (not administered)  oxyCODONE (Oxy IR/ROXICODONE) immediate release tablet 5-15 mg (not administered)  HYDROmorphone (DILAUDID) injection 0.5 mg (not administered)  morphine 4 MG/ML injection 4 mg (4 mg Intravenous Given 04/07/17 1322)  sodium chloride 0.9 % bolus 500 mL (500 mLs Intravenous New Bag/Given 04/07/17 1321)     Initial Impression / Assessment and Plan / ED Course  I have reviewed the triage vital signs and the nursing notes.  Pertinent labs & imaging results that were available during my care of the patient were reviewed by me  and considered in my medical decision making (see chart for details).     46 y.o. M who presents for evaluation of left lower extremity pain after mechanical fall.  Patient reports he was walking outside when he slipped on the ice, causing him to fall.  Patient states that it feels like his left  leg gave out.  He has not been able to ambulate or bear weight since the incident.  He denies any head injury or LOC.  Patient was given 200 mcg of fentanyl in route to ED with improvement of pain. Patient is afebrile, non-toxic appearing, sitting comfortably on examination table. Vital signs reviewed and stable. Patient is neurovascularly intact.  Physical exam shows diffuse tenderness overlying the left femur, left knee, left tib-fib and left ankle.  Patient with good distal pulses.  Consider fracture versus dislocation versus sprain versus musculoskeletal injury. Plan to obtain XR imaging for further evaluation.   Imaging reviewed.  CT C-spine is unremarkable.  Femur x-ray shows a displaced transverse fracture through the distal left femoral shaft.  X-ray of tib-fib shows no bony abnormality.  There is a metallic foreign body noted lateral proximal calf. Discussed results with patient. Will plan to consult ortho.   Discussed with PA Tinnie Gens (Ortho). Will come evaluate patient in the ED.   Discussed with Tinnie Gens.  Plan for medical admission given considering patient's diabetes history.  He will plan to take patient to the OR later today.  Discussed with hospitalist.  Would like orthopedics to do admission with medical consult.  Discussed with orthopedic PA agreeable to plan.  Orthopedic will admit.  Final Clinical Impressions(s) / ED Diagnoses   Final diagnoses:  Closed displaced transverse fracture of shaft of left femur, initial encounter [facility])    ED Discharge Orders    None       Rosana Hoes 04/07/17 1810    Bethann Berkshire, MD 04/08/17 1036

## 2017-04-07 NOTE — Progress Notes (Signed)
Orthopedic Tech Progress Note Patient Details:  Dylan [name] 31-Jul-1970 [id number]  Musculoskeletal Traction Type of Traction: [place] Skin Traction Traction Location: LLE Traction Weight: 10 lbs   Post Interventions Patient Tolerated: Well Instructions Provided: Care of device   Jennye MoccasinHughes, [name] 04/07/2017, 6:32 PM

## 2017-04-07 NOTE — Consult Note (Signed)
[facility] Medical Consultation  Dylan [name] S Dylan [name] ZOX:[id number] DOB: 1970-12-30 DOA: 04/07/2017 PCP: Corwin LevinsJohn, Dylan W, MD   Requesting physician: Dylan [name] Date of consultation: 04/07/17 Reason for consultation: diabetes  Impression/Recommendations Principal Problem:   Diabetes [facility](HCC) Active Problems:   Hyperlipidemia   Essential hypertension   Asthma   Femur fracture, left (HCC)  #1. Diabetes. Serum glucose 303 upon presentation. Medications include oral agents only. Most recent hemoglobin A1c September of this year 7.8. Of note that was an increase from his baseline of 6. He states over the last 6 months she's been under a lot of stress not following his diet and has gained weight. -Provide 7 units of NovoLog now -Hold oral agents for now as appetite is unreliable -Sliding scale for optimal control -Obtain a hemoglobin A1c -Carb modified diet -Nothing by mouth past midnight  #2. Hypertension. Fair control in the emergency department. Home medications include Cozaar -Continue Cozaar -When necessary hydralazine  #3. Femur fracture. -Defer to orthopedics  #4. Hyperlipidemia -Continue home meds  #5. Asthma. Stable at baseline. -Chest x-ray preop  trh will  followup again tomorrow. Please contact if  can be of assistance in the meanwhile. Thank you for this consultation.  Chief Complaint: femur fx/diabetes  HPI:  Mr. Dylan SabalBerry very pleasant 46 year old male with a history of diabetes, hyperlipidemia, hypertension, asthma. He slipped and fell on the ice this morning. X-ray reveals left femur fracture in the shaft. He admitted by orthopedic surgery for surgery tomorrow. We are asked to consult for diabetes control. And states he slipped on the ice. He reports he felt pain in his left leg immediately was unable to get up. He did not hit his head did not lose consciousness. No recent illnesses he denies fever chills nausea vomiting diarrhea constipation melena. He  denies dysuria hematuria frequency or urgency. He is on oral medications for his diabetes and does not check his blood sugar routinely. He denies chest pain palpitation shortness of breath cough lower extremity edema orthopnea.  Review of Systems:  10 point review of systems complete and all systems are negative except as indicated in the history of present illness  Past Medical History:  Diagnosis Date  . ALLERGIC RHINITIS 11/25/2007  . ASTHMA 11/25/2007  . Asthma   . CARPAL TUNNEL SYNDROME, LEFT, MILD 11/25/2007  . DIABETES MELLITUS, TYPE II 11/25/2007  . HYPERLIPIDEMIA 11/25/2007  . NECK PAIN 08/27/2009  . Obesity   . Other specified forms of hearing loss 05/10/2009  . Other symptoms referable to lower leg joint 11/25/2007  . SHOULDER PAIN, LEFT 08/27/2009  . Unspecified hearing loss 04/25/2008   Past Surgical History:  Procedure Laterality Date  . BACK SURGERY    . s/p back surgury  2002   Lumbar disc   Social History:  reports that  has never smoked. he has never used smokeless tobacco. He reports that he does not drink alcohol or use drugs.  Allergies  Allergen Reactions  . Penicillins    Family History  Problem Relation Age of Onset  . Diabetes Other     Prior to Admission medications   Medication Sig Start Date End Date Taking? Authorizing Provider  aspirin 81 MG EC tablet Take 1 tablet (81 mg total) by mouth daily. Annual appt [name]/labs due in Sept must see MD for refills 11/09/16   Corwin LevinsJohn, Dylan W, MD  glipiZIDE (GLUCOTROL XL) 5 MG 24 hr tablet TAKE 1 TABLET (5 MG TOTAL) BY MOUTH DAILY. 01/10/16   Dylan RuizJohn,  Dylan BlalockJames W, MD  glucose blood test strip Use as instructed E11.0 03/04/17   Corwin LevinsJohn, Dylan W, MD  Lancets Misc. MISC Use as directed once daily 07/10/16   Corwin LevinsJohn, Dylan W, MD  losartan (COZAAR) 25 MG tablet Take 1 tablet (25 mg total) by mouth daily. 01/15/17   Corwin LevinsJohn, Dylan W, MD  losartan (COZAAR) 25 MG tablet TAKE 1 TABLET BY MOUTH EVERY DAY 02/03/17   Corwin LevinsJohn, Dylan W, MD  metFORMIN  (GLUCOPHAGE-XR) 500 MG 24 hr tablet Take 1 tablet (500 mg total) by mouth daily with breakfast. 01/15/17   Corwin LevinsJohn, Dylan W, MD  naproxen (NAPROSYN) 500 MG tablet TAKE 1 TABLET BY MOUTH TWICE A DAY WITH A MEAL 03/02/17   Corwin LevinsJohn, Dylan W, MD  pioglitazone (ACTOS) 30 MG tablet Take 1 tablet (30 mg total) by mouth daily. 07/10/16   Corwin LevinsJohn, Dylan W, MD  rosuvastatin (CRESTOR) 40 MG tablet TAKE 1 TABLET (40 MG TOTAL) BY MOUTH DAILY. 02/03/17   Corwin LevinsJohn, Dylan W, MD   Physical Exam: Blood pressure 132/78, pulse 80, temperature 97.9 F (36.6 C), temperature source Oral, resp. rate 15, height 6' (1.829 m), weight 131.5 kg (290 lb), SpO2 100 %. Vitals:   04/07/17 1400 04/07/17 1415  BP: 127/78 132/78  Pulse: 75 80  Resp: 18 15  Temp:    SpO2: 99% 100%     General:  Obese lying in bed in no acute distress  Eyes: Pupils equal round reactive to light. EOMI no scleral icterus  ENT: Ears clear nose without drainage oropharynx without erythema or exudate.  Neck: No JVD no lymphadenopathy full range of motion  Cardiovascular: Regular rate and rhythm no murmur gallop or rub no lower extremity edema  Respiratory: Normal effort breath sounds clear slightly distant hear no wheeze no rhonchi  Abdomen: Obese soft positive bowel sounds throughout no guarding or rebounding  Skin: Warm and dry  Musculoskeletal: Left leg with splint/stabalizing devise. Toes warm to touch. Other joints without swelling/erythema  Psychiatric: Cooperative, calm  Neurologic: Alert oriented 3 speech clear facial symmetry bilateral grip 5 out of 5  Labs on Admission:  Basic Metabolic Panel: Recent Labs  Lab 04/07/17 1314  NA 136  K 3.7  CL 103  CO2 25  GLUCOSE 303*  BUN 10  CREATININE 0.72  CALCIUM 8.6*   Liver Function Tests: No results for input(s): AST, ALT, ALKPHOS, BILITOT, PROT, ALBUMIN in the last 168 hours. No results for input(s): LIPASE, AMYLASE in the last 168 hours. No results for input(s): AMMONIA in the  last 168 hours. CBC: Recent Labs  Lab 04/07/17 1314  WBC 6.6  NEUTROABS 5.3  HGB 13.2  HCT 38.1*  MCV 84.3  PLT 166   Cardiac Enzymes: No results for input(s): CKTOTAL, CKMB, CKMBINDEX, TROPONINI in the last 168 hours. BNP: Invalid input(s): POCBNP CBG: No results for input(s): GLUCAP in the last 168 hours.  Radiological Exams on Admission: Dg Tibia/fibula Left  Result Date: 04/07/2017 CLINICAL DATA:  Fall.  Left leg pain. EXAM: LEFT TIBIA AND FIBULA - 2 VIEW COMPARISON:  None. FINDINGS: Metallic foreign body noted in the lateral proximal calf soft tissues. No underlying bony abnormality. No fracture, subluxation or dislocation. Knee joint and ankle joint are maintained. No joint effusion. IMPRESSION: No acute bony abnormality. Electronically Signed   By: Dylan NoseKevin  Dover M.D.   On: 04/07/2017 12:26   Ct Cervical Spine Wo Contrast  Result Date: 04/07/2017 CLINICAL DATA:  Status post fall EXAM: CT CERVICAL SPINE WITHOUT CONTRAST  TECHNIQUE: Multidetector CT imaging of the cervical spine was performed without intravenous contrast. Multiplanar CT image reconstructions were also generated. COMPARISON:  None. FINDINGS: Alignment: Normal. Skull base and vertebrae: No acute fracture. No primary bone lesion or focal pathologic process. Soft tissues and spinal canal: No prevertebral fluid or swelling. No visible canal hematoma. Disc levels: Disc spaces are maintained. Broad-based disc bulge at C3-4. No foraminal or central canal stenosis. Upper chest: Lung apices are clear. Other: No fluid collection or hematoma. IMPRESSION: 1.  No acute osseous injury of the cervical spine. Electronically Signed   By: Elige Ko   On: 04/07/2017 12:48   Dg Femur Min 2 Views Left  Result Date: 04/07/2017 CLINICAL DATA:  Fall.  Left femur pain EXAM: LEFT FEMUR 2 VIEWS COMPARISON:  None. FINDINGS: There is a transverse fracture through the left femoral distal shaft. Approximately 1 shaft with lateral displacement  of the distal fragments. IMPRESSION: Displaced transverse fracture through the distal left femoral shaft. Electronically Signed   By: Dylan Nose M.D.   On: 04/07/2017 12:26    EKG:  Time spent: 65 minutes  [facility] M [facility]   If 7PM-7AM, please contact night-coverage [email or web address] Password [facility] 04/07/2017, 2:49 PM

## 2017-04-08 ENCOUNTER — Encounter (HOSPITAL_COMMUNITY)
Admission: EM | Disposition: A | Discharge status: Skilled Nursing Facility | Payer: Self-pay | Source: Home / Self Care | Attending: Orthopedic Surgery

## 2017-04-08 ENCOUNTER — Inpatient Hospital Stay (HOSPITAL_COMMUNITY): Payer: 59

## 2017-04-08 ENCOUNTER — Inpatient Hospital Stay (HOSPITAL_COMMUNITY): Payer: 59 | Admitting: Anesthesiology

## 2017-04-08 ENCOUNTER — Encounter (HOSPITAL_COMMUNITY): Payer: Self-pay | Admitting: Anesthesiology

## 2017-04-08 DIAGNOSIS — T148XXA Other injury of unspecified body region, initial encounter: Secondary | ICD-10-CM

## 2017-04-08 DIAGNOSIS — E785 Hyperlipidemia, unspecified: Secondary | ICD-10-CM

## 2017-04-08 HISTORY — PX: FEMUR IM NAIL: SHX1597

## 2017-04-08 LAB — GLUCOSE, CAPILLARY
GLUCOSE-CAPILLARY: 290 mg/dL — AB (ref 65–99)
Glucose-Capillary: 200 mg/dL — ABNORMAL HIGH (ref 65–99)
Glucose-Capillary: 212 mg/dL — ABNORMAL HIGH (ref 65–99)
Glucose-Capillary: 231 mg/dL — ABNORMAL HIGH (ref 65–99)
Glucose-Capillary: 264 mg/dL — ABNORMAL HIGH (ref 65–99)
Glucose-Capillary: 312 mg/dL — ABNORMAL HIGH (ref 65–99)
Glucose-Capillary: 359 mg/dL — ABNORMAL HIGH (ref 65–99)

## 2017-04-08 LAB — SURGICAL PCR SCREEN
MRSA, PCR: NEGATIVE
Staphylococcus aureus: POSITIVE — AB

## 2017-04-08 LAB — VITAMIN D 25 HYDROXY (VIT D DEFICIENCY, FRACTURES): VIT D 25 HYDROXY: 6.7 ng/mL — AB (ref 30.0–100.0)

## 2017-04-08 LAB — CALCITRIOL (1,25 DI-OH VIT D): VIT D 1 25 DIHYDROXY: 54.3 pg/mL (ref 19.9–79.3)

## 2017-04-08 LAB — PTH, INTACT AND CALCIUM
Calcium, Total (PTH): 8.3 mg/dL — ABNORMAL LOW (ref 8.7–10.2)
PTH: 29 pg/mL (ref 15–65)

## 2017-04-08 SURGERY — INSERTION, INTRAMEDULLARY ROD, FEMUR, RETROGRADE
Anesthesia: General | Site: Leg Upper | Laterality: Left

## 2017-04-08 MED ORDER — INSULIN ASPART 100 UNIT/ML ~~LOC~~ SOLN
0.0000 [IU] | Freq: Three times a day (TID) | SUBCUTANEOUS | Status: DC
  Administered 2017-04-08: 15 [IU] via SUBCUTANEOUS

## 2017-04-08 MED ORDER — PROPOFOL 10 MG/ML IV BOLUS
INTRAVENOUS | Status: AC
  Filled 2017-04-08: qty 40

## 2017-04-08 MED ORDER — PROPOFOL 10 MG/ML IV BOLUS
INTRAVENOUS | Status: DC | PRN
  Administered 2017-04-08: 200 mg via INTRAVENOUS

## 2017-04-08 MED ORDER — ONDANSETRON HCL 4 MG/2ML IJ SOLN
INTRAMUSCULAR | Status: DC | PRN
  Administered 2017-04-08: 4 mg via INTRAVENOUS

## 2017-04-08 MED ORDER — POTASSIUM CHLORIDE IN NACL 20-0.9 MEQ/L-% IV SOLN
INTRAVENOUS | Status: DC
  Administered 2017-04-08 – 2017-04-10 (×4): via INTRAVENOUS
  Filled 2017-04-08 (×4): qty 1000

## 2017-04-08 MED ORDER — 0.9 % SODIUM CHLORIDE (POUR BTL) OPTIME
TOPICAL | Status: DC | PRN
  Administered 2017-04-08: 1000 mL

## 2017-04-08 MED ORDER — SUGAMMADEX SODIUM 500 MG/5ML IV SOLN
INTRAVENOUS | Status: DC | PRN
  Administered 2017-04-08: 275 mg via INTRAVENOUS

## 2017-04-08 MED ORDER — MIDAZOLAM HCL 5 MG/5ML IJ SOLN
INTRAMUSCULAR | Status: DC | PRN
  Administered 2017-04-08: 2 mg via INTRAVENOUS

## 2017-04-08 MED ORDER — CALCIUM CARBONATE 1250 (500 CA) MG PO TABS
1.0000 | ORAL_TABLET | Freq: Two times a day (BID) | ORAL | Status: DC
  Administered 2017-04-08 – 2017-04-11 (×6): 500 mg via ORAL
  Filled 2017-04-08 (×6): qty 1

## 2017-04-08 MED ORDER — DEXAMETHASONE SODIUM PHOSPHATE 4 MG/ML IJ SOLN
INTRAMUSCULAR | Status: DC | PRN
  Administered 2017-04-08: 4 mg via INTRAVENOUS

## 2017-04-08 MED ORDER — MEPERIDINE HCL 25 MG/ML IJ SOLN: 6.2500 mg | INTRAMUSCULAR | Status: DC | PRN

## 2017-04-08 MED ORDER — ONDANSETRON HCL 4 MG/2ML IJ SOLN
INTRAMUSCULAR | Status: AC
  Filled 2017-04-08: qty 2

## 2017-04-08 MED ORDER — LIDOCAINE HCL (CARDIAC) 20 MG/ML IV SOLN
INTRAVENOUS | Status: DC | PRN
  Administered 2017-04-08: 30 mg via INTRAVENOUS

## 2017-04-08 MED ORDER — METOCLOPRAMIDE HCL 5 MG/ML IJ SOLN: 5.0000 mg | Freq: Three times a day (TID) | INTRAMUSCULAR | Status: DC | PRN

## 2017-04-08 MED ORDER — CEFAZOLIN SODIUM-DEXTROSE 2-4 GM/100ML-% IV SOLN: 2.0000 g | INTRAVENOUS | Status: DC

## 2017-04-08 MED ORDER — PHENYLEPHRINE HCL 10 MG/ML IJ SOLN
INTRAVENOUS | Status: DC | PRN
  Administered 2017-04-08: 20 ug/min via INTRAVENOUS

## 2017-04-08 MED ORDER — DEXAMETHASONE SODIUM PHOSPHATE 10 MG/ML IJ SOLN
INTRAMUSCULAR | Status: AC
  Filled 2017-04-08: qty 1

## 2017-04-08 MED ORDER — HYDROMORPHONE HCL 1 MG/ML IJ SOLN
1.0000 mg | INTRAMUSCULAR | Status: DC | PRN
  Administered 2017-04-09 – 2017-04-11 (×13): 1 mg via INTRAVENOUS
  Filled 2017-04-08 (×13): qty 1

## 2017-04-08 MED ORDER — CEFAZOLIN SODIUM-DEXTROSE 1-4 GM/50ML-% IV SOLN
1.0000 g | Freq: Four times a day (QID) | INTRAVENOUS | Status: AC
  Administered 2017-04-08 – 2017-04-09 (×3): 1 g via INTRAVENOUS
  Filled 2017-04-08 (×3): qty 50

## 2017-04-08 MED ORDER — ACETAMINOPHEN 650 MG RE SUPP: 650.0000 mg | RECTAL | Status: DC | PRN

## 2017-04-08 MED ORDER — CEFAZOLIN SODIUM-DEXTROSE 1-4 GM/50ML-% IV SOLN: 1.0000 g | INTRAVENOUS | Status: DC

## 2017-04-08 MED ORDER — ROSUVASTATIN CALCIUM 40 MG PO TABS
40.0000 mg | ORAL_TABLET | Freq: Every day | ORAL | Status: DC
  Administered 2017-04-08 – 2017-04-10 (×3): 40 mg via ORAL
  Filled 2017-04-08 (×5): qty 1

## 2017-04-08 MED ORDER — MIDAZOLAM HCL 2 MG/2ML IJ SOLN
INTRAMUSCULAR | Status: AC
Start: 2017-04-08 — End: 2017-04-08
  Filled 2017-04-08: qty 2

## 2017-04-08 MED ORDER — ONDANSETRON HCL 4 MG/2ML IJ SOLN: 4.0000 mg | Freq: Four times a day (QID) | INTRAMUSCULAR | Status: DC | PRN

## 2017-04-08 MED ORDER — CALCIUM CITRATE 950 (200 CA) MG PO TABS
400.0000 mg | ORAL_TABLET | Freq: Two times a day (BID) | ORAL | Status: DC
  Filled 2017-04-08: qty 2

## 2017-04-08 MED ORDER — PROMETHAZINE HCL 25 MG/ML IJ SOLN: 6.2500 mg | INTRAMUSCULAR | Status: DC | PRN

## 2017-04-08 MED ORDER — ACETAMINOPHEN 500 MG PO TABS
1000.0000 mg | ORAL_TABLET | Freq: Four times a day (QID) | ORAL | Status: DC
  Administered 2017-04-08 – 2017-04-11 (×10): 1000 mg via ORAL
  Filled 2017-04-08 (×10): qty 2

## 2017-04-08 MED ORDER — LOSARTAN POTASSIUM 50 MG PO TABS
25.0000 mg | ORAL_TABLET | Freq: Every day | ORAL | Status: DC
  Administered 2017-04-08 – 2017-04-11 (×4): 25 mg via ORAL
  Filled 2017-04-08 (×4): qty 1

## 2017-04-08 MED ORDER — ACETAMINOPHEN 325 MG PO TABS: 650.0000 mg | ORAL_TABLET | ORAL | Status: DC | PRN

## 2017-04-08 MED ORDER — VITAMIN D 1000 UNITS PO TABS
2000.0000 [IU] | ORAL_TABLET | Freq: Two times a day (BID) | ORAL | Status: DC
  Administered 2017-04-08 – 2017-04-11 (×6): 2000 [IU] via ORAL
  Filled 2017-04-08 (×6): qty 2

## 2017-04-08 MED ORDER — LACTATED RINGERS IV SOLN
INTRAVENOUS | Status: DC
  Administered 2017-04-08: 12:00:00 via INTRAVENOUS

## 2017-04-08 MED ORDER — PHENYLEPHRINE HCL 10 MG/ML IJ SOLN
INTRAMUSCULAR | Status: DC | PRN
  Administered 2017-04-08 (×5): 80 ug via INTRAVENOUS

## 2017-04-08 MED ORDER — FENTANYL CITRATE (PF) 100 MCG/2ML IJ SOLN
INTRAMUSCULAR | Status: DC | PRN
  Administered 2017-04-08 (×4): 50 ug via INTRAVENOUS

## 2017-04-08 MED ORDER — INSULIN ASPART 100 UNIT/ML ~~LOC~~ SOLN
SUBCUTANEOUS | Status: AC
  Administered 2017-04-08: 5 [IU] via SUBCUTANEOUS
  Filled 2017-04-08: qty 1

## 2017-04-08 MED ORDER — ONDANSETRON HCL 4 MG PO TABS: 4.0000 mg | ORAL_TABLET | Freq: Four times a day (QID) | ORAL | Status: DC | PRN

## 2017-04-08 MED ORDER — ROCURONIUM BROMIDE 100 MG/10ML IV SOLN
INTRAVENOUS | Status: DC | PRN
  Administered 2017-04-08: 20 mg via INTRAVENOUS
  Administered 2017-04-08: 60 mg via INTRAVENOUS
  Administered 2017-04-08 (×2): 10 mg via INTRAVENOUS

## 2017-04-08 MED ORDER — HYDROMORPHONE HCL 1 MG/ML IJ SOLN
0.2500 mg | INTRAMUSCULAR | Status: DC | PRN
  Administered 2017-04-08 (×2): 0.5 mg via INTRAVENOUS

## 2017-04-08 MED ORDER — SUGAMMADEX SODIUM 500 MG/5ML IV SOLN
INTRAVENOUS | Status: AC
  Filled 2017-04-08: qty 5

## 2017-04-08 MED ORDER — LACTATED RINGERS IV SOLN
INTRAVENOUS | Status: DC | PRN
  Administered 2017-04-08 (×2): via INTRAVENOUS

## 2017-04-08 MED ORDER — VITAMIN C 500 MG PO TABS
500.0000 mg | ORAL_TABLET | Freq: Every day | ORAL | Status: DC
  Administered 2017-04-08 – 2017-04-11 (×4): 500 mg via ORAL
  Filled 2017-04-08 (×4): qty 1

## 2017-04-08 MED ORDER — LIDOCAINE 2% (20 MG/ML) 5 ML SYRINGE
INTRAMUSCULAR | Status: AC
  Filled 2017-04-08: qty 5

## 2017-04-08 MED ORDER — CEFAZOLIN SODIUM-DEXTROSE 1-4 GM/50ML-% IV SOLN
1.0000 g | INTRAVENOUS | Status: DC
  Filled 2017-04-08: qty 50

## 2017-04-08 MED ORDER — METOCLOPRAMIDE HCL 5 MG PO TABS: 5.0000 mg | ORAL_TABLET | Freq: Three times a day (TID) | ORAL | Status: DC | PRN

## 2017-04-08 MED ORDER — HYDROMORPHONE HCL 1 MG/ML IJ SOLN
INTRAMUSCULAR | Status: AC
  Administered 2017-04-08: 0.5 mg via INTRAVENOUS
  Filled 2017-04-08: qty 1

## 2017-04-08 MED ORDER — FENTANYL CITRATE (PF) 250 MCG/5ML IJ SOLN
INTRAMUSCULAR | Status: AC
  Filled 2017-04-08: qty 5

## 2017-04-08 MED ORDER — ENOXAPARIN SODIUM 40 MG/0.4ML ~~LOC~~ SOLN
40.0000 mg | SUBCUTANEOUS | Status: DC
  Administered 2017-04-09 – 2017-04-11 (×3): 40 mg via SUBCUTANEOUS
  Filled 2017-04-08 (×3): qty 0.4

## 2017-04-08 SURGICAL SUPPLY — 70 items
BANDAGE ACE 4X5 VEL STRL LF (GAUZE/BANDAGES/DRESSINGS) IMPLANT
BANDAGE ACE 6X5 VEL STRL LF (GAUZE/BANDAGES/DRESSINGS) IMPLANT
BANDAGE ESMARK 6X9 LF (GAUZE/BANDAGES/DRESSINGS) IMPLANT
BIT DRILL CALIBRATED 4.3MMX365 (DRILL) ×1 IMPLANT
BIT DRILL CROWE PNT TWST 4.5MM (DRILL) ×2 IMPLANT
BNDG COHESIVE 4X5 TAN STRL (GAUZE/BANDAGES/DRESSINGS) ×3 IMPLANT
BNDG ESMARK 6X9 LF (GAUZE/BANDAGES/DRESSINGS)
BNDG GAUZE ELAST 4 BULKY (GAUZE/BANDAGES/DRESSINGS) ×3 IMPLANT
BRUSH SCRUB SURG 4.25 DISP (MISCELLANEOUS) ×6 IMPLANT
CHLORAPREP W/TINT 10.5 ML (MISCELLANEOUS) IMPLANT
CONT SPEC 4OZ CLIKSEAL STRL BL (MISCELLANEOUS) ×3 IMPLANT
COVER MAYO STAND STRL (DRAPES) ×3 IMPLANT
COVER SURGICAL LIGHT HANDLE (MISCELLANEOUS) ×3 IMPLANT
DRAPE C-ARM 42X72 X-RAY (DRAPES) ×3 IMPLANT
DRAPE C-ARMOR (DRAPES) ×3 IMPLANT
DRAPE IMP U-DRAPE 54X76 (DRAPES) ×6 IMPLANT
DRAPE INCISE IOBAN 66X45 STRL (DRAPES) ×6 IMPLANT
DRAPE ORTHO SPLIT 77X108 STRL (DRAPES) ×4
DRAPE PROXIMA HALF (DRAPES) ×6 IMPLANT
DRAPE SURG ORHT 6 SPLT 77X108 (DRAPES) ×2 IMPLANT
DRAPE U-SHAPE 47X51 STRL (DRAPES) ×3 IMPLANT
DRILL CALIBRATED 4.3MMX365 (DRILL) ×3
DRILL CROWE POINT TWIST 4.5MM (DRILL) ×6
DRSG MEPILEX BORDER 4X4 (GAUZE/BANDAGES/DRESSINGS) ×9 IMPLANT
DRSG MEPITEL 4X7.2 (GAUZE/BANDAGES/DRESSINGS) ×3 IMPLANT
ELECT REM PT RETURN 9FT ADLT (ELECTROSURGICAL) ×3
ELECTRODE REM PT RTRN 9FT ADLT (ELECTROSURGICAL) ×1 IMPLANT
EVACUATOR 1/8 PVC DRAIN (DRAIN) IMPLANT
GAUZE SPONGE 4X4 12PLY STRL (GAUZE/BANDAGES/DRESSINGS) ×3 IMPLANT
GAUZE XEROFORM 1X8 LF (GAUZE/BANDAGES/DRESSINGS) IMPLANT
GLOVE BIO SURGEON STRL SZ7.5 (GLOVE) ×9 IMPLANT
GLOVE BIO SURGEON STRL SZ8 (GLOVE) ×9 IMPLANT
GLOVE BIOGEL PI IND STRL 7.5 (GLOVE) ×2 IMPLANT
GLOVE BIOGEL PI IND STRL 8 (GLOVE) ×1 IMPLANT
GLOVE BIOGEL PI INDICATOR 7.5 (GLOVE) ×4
GLOVE BIOGEL PI INDICATOR 8 (GLOVE) ×2
GLOVE SURG SS PI 7.0 STRL IVOR (GLOVE) ×6 IMPLANT
GOWN STRL REUS W/ TWL LRG LVL3 (GOWN DISPOSABLE) ×4 IMPLANT
GOWN STRL REUS W/ TWL XL LVL3 (GOWN DISPOSABLE) ×1 IMPLANT
GOWN STRL REUS W/TWL LRG LVL3 (GOWN DISPOSABLE) ×8
GOWN STRL REUS W/TWL XL LVL3 (GOWN DISPOSABLE) ×2
GUIDEWIRE BEAD TIP (WIRE) ×3 IMPLANT
KIT BASIN OR (CUSTOM PROCEDURE TRAY) ×3 IMPLANT
KIT ROOM TURNOVER OR (KITS) ×3 IMPLANT
NAIL FEMORAL RETRO 9X420 (Nail) ×3 IMPLANT
PACK ORTHO EXTREMITY (CUSTOM PROCEDURE TRAY) ×3 IMPLANT
PACK UNIVERSAL I (CUSTOM PROCEDURE TRAY) IMPLANT
PAD ABD 8X10 STRL (GAUZE/BANDAGES/DRESSINGS) ×3 IMPLANT
PAD ARMBOARD 7.5X6 YLW CONV (MISCELLANEOUS) ×6 IMPLANT
REAMER ONE STEP 12.2MM (TRAUMA) ×3 IMPLANT
SCREW CORT TI DBL LEAD 5X34 (Screw) ×3 IMPLANT
SCREW CORT TI DBL LEAD 5X36 (Screw) ×3 IMPLANT
SCREW CORT TI DBL LEAD 5X65 (Screw) ×3 IMPLANT
SCREW CORT TI DBL LEAD 5X75 (Screw) ×3 IMPLANT
SCREW CORT TI DBL LEAD 5X80 (Screw) ×3 IMPLANT
SPONGE LAP 18X18 X RAY DECT (DISPOSABLE) ×3 IMPLANT
STAPLER VISISTAT 35W (STAPLE) ×3 IMPLANT
STOCKINETTE IMPERVIOUS LG (DRAPES) ×3 IMPLANT
SUT ETHILON 3 0 PS 1 (SUTURE) ×6 IMPLANT
SUT PROLENE 3 0 PS 2 (SUTURE) IMPLANT
SUT VIC AB 0 CT1 27 (SUTURE) ×2
SUT VIC AB 0 CT1 27XBRD ANBCTR (SUTURE) ×1 IMPLANT
SUT VIC AB 2-0 CT1 27 (SUTURE) ×4
SUT VIC AB 2-0 CT1 TAPERPNT 27 (SUTURE) ×2 IMPLANT
SUT VIC AB 2-0 CT3 27 (SUTURE) IMPLANT
TOWEL OR 17X24 6PK STRL BLUE (TOWEL DISPOSABLE) ×6 IMPLANT
TOWEL OR 17X26 10 PK STRL BLUE (TOWEL DISPOSABLE) ×6 IMPLANT
TUBE CONNECTING 12'X1/4 (SUCTIONS) ×1
TUBE CONNECTING 12X1/4 (SUCTIONS) ×2 IMPLANT
YANKAUER SUCT BULB TIP NO VENT (SUCTIONS) ×3 IMPLANT

## 2017-04-08 NOTE — Progress Notes (Signed)
PROGRESS NOTE    Dylan [name]  MVH:[id number] DOB: [birth year] DOA: 04/07/2017 PCP: Corwin LevinsJohn, [name], MD   Chief Complaint  Patient presents with  . Fall  . Leg Injury    Brief Narrative:  Consulted for diabetes management.  Assessment & Plan   Diabetes mellitus, type II -Patient presented with glucose of 303 -Patient is on oral agents at home including metformin, glipizide, pioglitazone -Hemoglobin A1c 9.9 -Continue insulin sliding scale CBG monitoring -upon discharge patient may need to increase his dose of metformin  Essential hypertension -Will restart Cozaar  Femoral fracture, left -Orthopedic surgery primary -Continue pain control  Hyperlipidemia -Will restart patient's statin  Asthma -Appears to be stable, chest x-ray unremarkable  DVT Prophylaxis  Lovenox  Code Status: Full  Family Communication: Family at bedside  Disposition Plan: Admitted by orthopedics  Consultants [facility]  Procedures  INTRAMEDULLARY (IM) RETROGRADE FEMORAL NAILING (Left) with Biomet [place] 9 x 420mm statically locked  Antibiotics   Anti-infectives (From admission, onward)   Start     Dose/Rate Route Frequency Ordered Stop   04/08/17 1500  ceFAZolin (ANCEF) IVPB 1 g/50 mL premix     1 g 100 mL/hr over 30 Minutes Intravenous Every 6 hours 04/08/17 1300 04/09/17 0859   04/08/17 0845  ceFAZolin (ANCEF) IVPB 1 g/50 mL premix  Status:  Discontinued     1 g 100 mL/hr over 30 Minutes Intravenous To Surgery 04/08/17 0830 04/08/17 1240   04/08/17 0845  ceFAZolin (ANCEF) IVPB 1 g/50 mL premix  Status:  Discontinued     1 g 100 mL/hr over 30 Minutes Intravenous To Surgery 04/08/17 0830 04/08/17 0831   04/08/17 0845  ceFAZolin (ANCEF) IVPB 2g/100 mL premix  Status:  Discontinued     2 g 200 mL/hr over 30 Minutes Intravenous To Surgery 04/08/17 0831 04/08/17 1240   04/08/17 0800  ceFAZolin (ANCEF) 3 g in dextrose 5 % 50 mL IVPB     3 g 130 mL/hr over 30 Minutes Intravenous To ShortStay  Surgical 04/07/17 1730 04/08/17 [id number]      Subjective:   Dylan [name] seen and examined today.  Complains of some left leg pain and feels tired after surgery.  Patient denies dizziness, chest pain, shortness of breath, abdominal pain, N/V/D/C.  Objective:   Vitals:   04/08/17 1155 04/08/17 1211 04/08/17 1225 04/08/17 1246  BP: (!) 100/46 (!) 104/52 (!) 100/53 (!) 113/50  Pulse: 82 75 73 71  Resp: 13 14 12 18   Temp:   98.4 F (36.9 C) 98.3 F (36.8 C)  TempSrc:    Oral  SpO2: 93% 95% 96% 97%  Weight:      Height:        Intake/Output Summary (Last 24 hours) at 04/08/2017 1321 Last data filed at 04/08/2017 1245 Gross per 24 hour  Intake 3180 ml  Output 2230 ml  Net 950 ml   Filed Weights   04/07/17 1131  Weight: 131.5 kg (290 lb)    Exam  General: Well developed, well nourished, NAD, appears stated age  HEENT: NCAT, mucous membranes moist.   Cardiovascular: S1 S2 auscultated, no rubs, murmurs or gallops. Regular rate and rhythm.  Respiratory: Clear to auscultation bilaterally with equal chest rise  Abdomen: Soft, nontender, nondistended, + bowel sounds  Extremities: warm dry without cyanosis clubbing or edema.   Neuro: AAOx3, nonfocal  Psych: Normal affect and demeanor with intact judgement and insight   Data Reviewed: I have personally reviewed following labs and imaging studies  CBC: Recent Labs  Lab 04/07/17 1314  WBC 6.6  NEUTROABS 5.3  HGB 13.2  HCT 38.1*  MCV 84.3  PLT 166   Basic Metabolic Panel: Recent Labs  Lab 04/07/17 1314 04/07/17 1834  NA 136  --   K 3.7  --   CL 103  --   CO2 25  --   GLUCOSE 303*  --   BUN 10  --   CREATININE 0.72  --   CALCIUM 8.6* 8.3*  MG  --  1.8  PHOS  --  2.8   GFR: Estimated Creatinine Clearance: 161.9 mL/min (by C-G formula based on SCr of 0.72 mg/dL). Liver Function Tests: No results for input(s): AST, ALT, ALKPHOS, BILITOT, PROT, ALBUMIN in the last 168 hours. No results for input(s):  LIPASE, AMYLASE in the last 168 hours. No results for input(s): AMMONIA in the last 168 hours. Coagulation Profile: Recent Labs  Lab 04/07/17 1524  INR 1.03   Cardiac Enzymes: No results for input(s): CKTOTAL, CKMB, CKMBINDEX, TROPONINI in the last 168 hours. BNP (last 3 results) No results for input(s): PROBNP in the last 8760 hours. HbA1C: Recent Labs    04/07/17 1314  HGBA1C 9.9*   CBG: Recent Labs  Lab 04/08/17 0049 04/08/17 0434 04/08/17 0804 04/08/17 1144 04/08/17 1231  GLUCAP 231* 200* 212* 312* 290*   Lipid Profile: No results for input(s): CHOL, HDL, LDLCALC, TRIG, CHOLHDL, LDLDIRECT in the last 72 hours. Thyroid Function Tests: Recent Labs    04/07/17 1834  TSH 1.091   Anemia Panel: No results for input(s): VITAMINB12, FOLATE, FERRITIN, TIBC, IRON, RETICCTPCT in the last 72 hours. Urine analysis:    Component Value Date/Time   COLORURINE YELLOW 01/14/2017 1325   APPEARANCEUR CLEAR 01/14/2017 1325   LABSPEC 1.010 01/14/2017 1325   PHURINE 7.0 01/14/2017 1325   GLUCOSEU NEGATIVE 01/14/2017 1325   HGBUR NEGATIVE 01/14/2017 1325   BILIRUBINUR NEGATIVE 01/14/2017 1325   KETONESUR NEGATIVE 01/14/2017 1325   UROBILINOGEN 0.2 01/14/2017 1325   NITRITE NEGATIVE 01/14/2017 1325   LEUKOCYTESUR TRACE (A) 01/14/2017 1325   Sepsis Labs: @LABRCNTIP (procalcitonin:4,lacticidven:4)  ) Recent Results (from the past 240 hour(s))  Surgical pcr screen     Status: Abnormal   Collection Time: 04/08/17  3:00 AM  Result Value Ref Range Status   MRSA, PCR NEGATIVE NEGATIVE Final   Staphylococcus aureus POSITIVE (A) NEGATIVE Final    Comment: (NOTE) The Xpert SA Assay (FDA approved for NASAL specimens in patients 30 years of age and older), is one component of a comprehensive surveillance program. It is not intended to diagnose infection nor to guide or monitor treatment.       Radiology Studies: Dg Tibia/fibula Left  Result Date: 04/07/2017 CLINICAL DATA:   Fall.  Left leg pain. EXAM: LEFT TIBIA AND FIBULA - 2 VIEW COMPARISON:  None. FINDINGS: Metallic foreign body noted in the lateral proximal calf soft tissues. No underlying bony abnormality. No fracture, subluxation or dislocation. Knee joint and ankle joint are maintained. No joint effusion. IMPRESSION: No acute bony abnormality. Electronically Signed   By: Charlett Nose M.D.   On: 04/07/2017 12:26   Ct Cervical Spine Wo Contrast  Result Date: 04/07/2017 CLINICAL DATA:  Status post fall EXAM: CT CERVICAL SPINE WITHOUT CONTRAST TECHNIQUE: Multidetector CT imaging of the cervical spine was performed without intravenous contrast. Multiplanar CT image reconstructions were also generated. COMPARISON:  None. FINDINGS: Alignment: Normal. Skull base and vertebrae: No acute fracture. No primary bone lesion or focal pathologic  process. Soft tissues and spinal canal: No prevertebral fluid or swelling. No visible canal hematoma. Disc levels: Disc spaces are maintained. Broad-based disc bulge at C3-4. No foraminal or central canal stenosis. Upper chest: Lung apices are clear. Other: No fluid collection or hematoma. IMPRESSION: 1.  No acute osseous injury of the cervical spine. Electronically Signed   By: Elige KoHetal  Patel   On: 04/07/2017 12:48   Dg Chest Port 1 View  Result Date: 04/07/2017 CLINICAL DATA:  46 year old male under preoperative evaluation. History of leg fracture. EXAM: PORTABLE CHEST 1 VIEW COMPARISON:  Chest x-ray 02/20/2008. FINDINGS: Mild linear scarring or subsegmental atelectasis in the lower left hemithorax either in the lingula or left lower lobe. Lung volumes are low. No consolidative airspace disease. No pleural effusions. No evidence pulmonary edema. Heart size is normal. Upper mediastinal contours are within normal limits. IMPRESSION: 1. Mild linear scarring or subsegmental atelectasis in the left lower lung. No radiographic evidence of acute cardiopulmonary disease. Electronically Signed   By:  Trudie Reedaniel  Entrikin M.D.   On: 04/07/2017 15:42   Dg C-arm 61-120 Min  Result Date: 04/08/2017 CLINICAL DATA:  Internal fixation EXAM: LEFT FEMUR 2 VIEWS; DG C-ARM 61-120 MIN COMPARISON:  04/07/2017 FINDINGS: Multiple intraoperative spot images demonstrate placement of intramedullary nail across the left femoral shaft fracture. Anatomic alignment. No hardware complicating feature. IMPRESSION: Internal fixation across the left femoral shaft fracture. No visible complicating feature. Electronically Signed   By: Charlett NoseKevin  Dover M.D.   On: 04/08/2017 11:28   Dg Femur Min 2 Views Left  Result Date: 04/08/2017 CLINICAL DATA:  Internal fixation EXAM: LEFT FEMUR 2 VIEWS; DG C-ARM 61-120 MIN COMPARISON:  04/07/2017 FINDINGS: Multiple intraoperative spot images demonstrate placement of intramedullary nail across the left femoral shaft fracture. Anatomic alignment. No hardware complicating feature. IMPRESSION: Internal fixation across the left femoral shaft fracture. No visible complicating feature. Electronically Signed   By: Charlett NoseKevin  Dover M.D.   On: 04/08/2017 11:28   Dg Femur Min 2 Views Left  Result Date: 04/07/2017 CLINICAL DATA:  Fall.  Left femur pain EXAM: LEFT FEMUR 2 VIEWS COMPARISON:  None. FINDINGS: There is a transverse fracture through the left femoral distal shaft. Approximately 1 shaft with lateral displacement of the distal fragments. IMPRESSION: Displaced transverse fracture through the distal left femoral shaft. Electronically Signed   By: Charlett NoseKevin  Dover M.D.   On: 04/07/2017 12:26     Scheduled Meds: . docusate sodium  100 mg Oral BID  . [START ON 04/09/2017] enoxaparin (LOVENOX) injection  40 mg Subcutaneous Q24H  . insulin aspart  0-15 Units Subcutaneous TID WC  . methocarbamol  750 mg Oral QID   Continuous Infusions: . sodium chloride 75 mL/hr at 04/07/17 1703  . 0.9 % NaCl with KCl 20 mEq / L    .  ceFAZolin (ANCEF) IV    . methocarbamol (ROBAXIN)  IV       LOS: 1 day   Time  Spent in minutes   30 minutes  Jorgina Binning D.O. on 04/08/2017 at 1:21 PM  Between 7am to 7pm - Pager - [phone]  After 7pm go to [email or web address] - password [email or web address]  And look for the night coverage person covering for me after hours  [facility] Office  [phone]

## 2017-04-08 NOTE — Anesthesia Procedure Notes (Signed)
Procedure Name: Intubation Date/Time: 04/08/2017 8:48 AM Performed by: Lovie Cholock, Cliffard Hair K, CRNA Pre-anesthesia Checklist: Patient identified, Emergency Drugs available, Suction available and Patient being monitored Patient Re-evaluated:Patient Re-evaluated prior to induction Oxygen Delivery Method: Circle System Utilized Preoxygenation: Pre-oxygenation with 100% oxygen Induction Type: IV induction Ventilation: Mask ventilation without difficulty Laryngoscope Size: Miller and 3 Grade View: Grade II Tube type: Oral Tube size: 8.0 mm Number of attempts: 1 Airway Equipment and Method: Stylet and Oral airway Placement Confirmation: ETT inserted through vocal cords under direct vision,  positive ETCO2 and breath sounds checked- equal and bilateral Secured at: 22 cm Tube secured with: Tape Dental Injury: Teeth and Oropharynx as per pre-operative assessment

## 2017-04-08 NOTE — Progress Notes (Signed)
Orthopedic Tech Progress Note Patient Details:  Charlott Hollerichawona S Madril 03/02/1971 [id number]  Ortho Devices Ortho Device/Splint Location: applied ohf to bed Ortho Device/Splint Interventions: Ordered, Application   Post Interventions Patient Tolerated: Well Instructions Provided: Care of device   Jennye MoccasinHughes, Jayden Rudge Craig 04/08/2017, 7:08 PM

## 2017-04-08 NOTE — Transfer of Care (Signed)
Immediate Anesthesia Transfer of Care Note  Patient: Dylan [name]  Procedure(s) Performed: INTRAMEDULLARY (IM) RETROGRADE FEMORAL NAILING (Left Leg Upper)  Patient Location: PACU  Anesthesia Type:General  Level of Consciousness: oriented, drowsy and patient cooperative  Airway & Oxygen Therapy: Patient Spontanous Breathing and Patient connected to face mask oxygen  Post-op Assessment: Report given to RN and Post -op Vital signs reviewed and stable  Post vital signs: Reviewed  Last Vitals:  Vitals:   04/08/17 0452 04/08/17 1140  BP: 113/62 (!) 107/46  Pulse: 73 82  Resp: 18 13  Temp: (!) 36.3 C 36.9 C  SpO2: 94% 99%    Last Pain:  Vitals:   04/08/17 0452  TempSrc: Oral  PainSc:          Complications: No apparent anesthesia complications

## 2017-04-08 NOTE — Progress Notes (Signed)
ANTICOAGULATION CONSULT NOTE  Pharmacy Consult for Lovenox Indication: VTE prophylaxis   Assessment: 46 yom s/p IM nail procedure for L femoral shaft fracture on 12/13. Pharmacy consulted to dose Lovenox for VTE prophylaxis. Not on anticoagulation PTA. CBC wnl. No bleeding documented.  Goal of Therapy:  VTE prophylaxis Monitor platelets by anticoagulation protocol: Yes   Plan:  Lovenox 40mg  [place] q24h to start tomorrow AM Monitor CBC at least q72h while on Lovenox, s/sx bleeding  Babs BertinHaley Avo Schlachter, PharmD, BCPS Clinical Pharmacist 04/08/2017 1:13 PM

## 2017-04-08 NOTE — Anesthesia Preprocedure Evaluation (Addendum)
Anesthesia Evaluation  Patient identified by MRN, date of birth, ID band Patient awake    Reviewed: Allergy & Precautions, NPO status , Patient's Chart, lab work & pertinent test results  Airway Mallampati: II  TM Distance: >3 FB Neck ROM: Full    Dental  (+) Teeth Intact, Dental Advisory Given   Pulmonary asthma ,    breath sounds clear to auscultation       Cardiovascular hypertension, Pt. on medications  Rhythm:Regular Rate:Normal     Neuro/Psych PSYCHIATRIC DISORDERS Schizophrenia  Neuromuscular disease    GI/Hepatic negative GI ROS, Neg liver ROS,   Endo/Other  diabetes, Type 2, Oral Hypoglycemic Agents  Renal/GU negative Renal ROS  negative genitourinary   Musculoskeletal negative musculoskeletal ROS (+)   Abdominal (+) + obese,   Peds  Hematology negative hematology ROS (+)   Anesthesia Other Findings - HLD  Reproductive/Obstetrics                            Lab Results  Component Value Date   WBC 6.6 04/07/2017   HGB 13.2 04/07/2017   HCT 38.1 (L) 04/07/2017   MCV 84.3 04/07/2017   PLT 166 04/07/2017   Lab Results  Component Value Date   CREATININE 0.72 04/07/2017   BUN 10 04/07/2017   NA 136 04/07/2017   K 3.7 04/07/2017   CL 103 04/07/2017   CO2 25 04/07/2017   Lab Results  Component Value Date   INR 1.03 04/07/2017   EKG: normal sinus rhythm.  Anesthesia Physical Anesthesia Plan  ASA: III  Anesthesia Plan: General   Post-op Pain Management:    Induction: Intravenous  PONV Risk Score and Plan: 3 and Ondansetron, Dexamethasone and Midazolam  Airway Management Planned: Oral ETT  Additional Equipment:   Intra-op Plan:   Post-operative Plan: Extubation in OR  Informed Consent: I have reviewed the patients History and Physical, chart, labs and discussed the procedure including the risks, benefits and alternatives for the proposed anesthesia with the  patient or authorized representative who has indicated his/her understanding and acceptance.   Dental advisory given  Plan Discussed with: CRNA  Anesthesia Plan Comments:         Anesthesia Quick Evaluation

## 2017-04-08 NOTE — Brief Op Note (Addendum)
04/07/2017 - 04/08/2017  11:12 AM  PATIENT:  Dylan [name]  46 y.o. male  PRE-OPERATIVE DIAGNOSIS:  Left femoral shaft fracture  POST-OPERATIVE DIAGNOSIS:  Left femoral shaft fracture  PROCEDURE:  Procedure(s): INTRAMEDULLARY (IM) RETROGRADE FEMORAL NAILING (Left) with Biomet [place] 9 x 420mm statically locked  SURGEON:  Surgeon(s) and Role:    Myrene Galas* Ulas Zuercher, MD - Primary  PHYSICIAN ASSISTANT: 1. Montez MoritaKeith Paul, PA-C; 2. PA student  ANESTHESIA:   general  EBL:  30 mL   BLOOD ADMINISTERED:none  DRAINS: none   LOCAL MEDICATIONS USED:  NONE  SPECIMEN:  Source of Specimen:  reamings for pathologic fracture  DISPOSITION OF SPECIMEN:  PATHOLOGY  COUNTS:  YES  TOURNIQUET:  * No tourniquets in log *  DICTATIO: [id number]: [id number]  PLAN OF CARE: Admit to inpatient   PATIENT DISPOSITION:  PACU - hemodynamically stable.   Delay start of Pharmacological VTE agent (>24hrs) due to surgical blood loss or risk of bleeding: no

## 2017-04-08 NOTE — Progress Notes (Signed)
Inpatient Diabetes Program Recommendations  AACE/ADA: New Consensus Statement on Inpatient Glycemic Control (2015)  Target Ranges:  Prepandial:   less than 140 mg/dL      Peak postprandial:   less than 180 mg/dL (1-2 hours)      Critically ill patients:  140 - 180 mg/dL    Results for Charlott HollerBERRY, Badr S (MRN [id number]) as of 04/08/2017 08:45  Ref. Range 07/09/2016 13:05 01/14/2017 13:25 04/07/2017 13:14  Hemoglobin A1C Latest Ref Range: 4.8 - 5.6 % 6.0 7.8 (H) 9.9 (H)   Review of Glycemic Control  Diabetes history: DM 2 Outpatient Diabetes medications: Metformin 500 mg Daily, Actos 30 mg Daily Current orders for Inpatient glycemic control:   Inpatient Diabetes Program Recommendations:    Please consider Novolog Moderate Correction 0-15 units Q4 if NPO tid + Novolog 0-5 units QHS. A1c significantly increased to 9.9% this admission. Patient in OR today, will speak with patient tomorrow.  Thanks,  Christena DeemShannon Matti Killingsworth RN, MSN, [facility] Inpatient Diabetes Coordinator Team Pager [phone] (8a-5p)

## 2017-04-08 NOTE — Care Management Note (Signed)
Case Management Note  Patient Details  Name: Dylan [name] MRN: [id number] Date of Birth: 1970-09-03  Subjective/Objective:                    Action/Plan:  Await PT/OT evals Expected Discharge Date:                  Expected Discharge Plan:     In-House Referral:     Discharge planning Services  CM Consult  Post Acute Care Choice:  Durable Medical Equipment, Home Health Choice offered to:     DME Arranged:    DME Agency:     HH Arranged:    HH Agency:     Status of Service:  In process, will continue to follow  If discussed at Long Length of Stay Meetings, dates discussed:    Additional Comments:  Dylan [name], Dylan Wehling Marie, RN 04/08/2017, 1:06 PM

## 2017-04-09 ENCOUNTER — Encounter (HOSPITAL_COMMUNITY): Payer: Self-pay | Admitting: Orthopedic Surgery

## 2017-04-09 DIAGNOSIS — E559 Vitamin D deficiency, unspecified: Secondary | ICD-10-CM

## 2017-04-09 DIAGNOSIS — E8881 Metabolic syndrome: Secondary | ICD-10-CM

## 2017-04-09 HISTORY — DX: Metabolic syndrome: E88.810

## 2017-04-09 HISTORY — DX: Metabolic syndrome: E88.81

## 2017-04-09 HISTORY — DX: Vitamin D deficiency, unspecified: E55.9

## 2017-04-09 LAB — BASIC METABOLIC PANEL
Anion gap: 7 (ref 5–15)
BUN: 7 mg/dL (ref 6–20)
CHLORIDE: 101 mmol/L (ref 101–111)
CO2: 30 mmol/L (ref 22–32)
Calcium: 8.4 mg/dL — ABNORMAL LOW (ref 8.9–10.3)
Creatinine, Ser: 0.75 mg/dL (ref 0.61–1.24)
GFR calc non Af Amer: >60 mL/min (ref >60)
Glucose, Bld: 200 mg/dL — ABNORMAL HIGH (ref 65–99)
POTASSIUM: 3.8 mmol/L (ref 3.5–5.1)
SODIUM: 138 mmol/L (ref 135–145)

## 2017-04-09 LAB — CBC
HEMATOCRIT: 36.4 % — AB (ref 39.0–52.0)
Hemoglobin: 12.2 g/dL — ABNORMAL LOW (ref 13.0–17.0)
MCH: 28.9 pg (ref 26.0–34.0)
MCHC: 33.5 g/dL (ref 30.0–36.0)
MCV: 86.3 fL (ref 78.0–100.0)
Platelets: 187 10*3/uL (ref 150–400)
RBC: 4.22 MIL/uL (ref 4.22–5.81)
RDW: 13.9 % (ref 11.5–15.5)
WBC: 8.9 10*3/uL (ref 4.0–10.5)

## 2017-04-09 LAB — GLUCOSE, CAPILLARY
GLUCOSE-CAPILLARY: 256 mg/dL — AB (ref 65–99)
GLUCOSE-CAPILLARY: 276 mg/dL — AB (ref 65–99)
GLUCOSE-CAPILLARY: 178 mg/dL — AB (ref 65–99)
Glucose-Capillary: 194 mg/dL — ABNORMAL HIGH (ref 65–99)

## 2017-04-09 MED ORDER — ENOXAPARIN SODIUM 40 MG/0.4ML ~~LOC~~ SOLN: 40.0000 mg | SUBCUTANEOUS | 0 refills | Status: DC

## 2017-04-09 MED ORDER — ENOXAPARIN (LOVENOX) PATIENT EDUCATION KIT
PACK | Freq: Once | Status: AC
  Administered 2017-04-09: 17:00:00
  Filled 2017-04-09: qty 1

## 2017-04-09 MED ORDER — OXYCODONE-ACETAMINOPHEN 7.5-325 MG PO TABS: 1.0000 | ORAL_TABLET | Freq: Three times a day (TID) | ORAL | 0 refills | Status: AC | PRN

## 2017-04-09 MED ORDER — DOCUSATE SODIUM 100 MG PO CAPS: 100.0000 mg | ORAL_CAPSULE | Freq: Two times a day (BID) | ORAL | 0 refills | Status: DC

## 2017-04-09 MED ORDER — OXYCODONE HCL 5 MG PO TABS: 5.0000 mg | ORAL_TABLET | Freq: Three times a day (TID) | ORAL | 0 refills | Status: DC | PRN

## 2017-04-09 MED ORDER — CALCIUM CARBONATE 1250 (500 CA) MG PO TABS
1.0000 | ORAL_TABLET | Freq: Two times a day (BID) | ORAL | 2 refills | Status: DC
Start: 2017-04-09 — End: 2017-08-24

## 2017-04-09 MED ORDER — CHOLECALCIFEROL 125 MCG (5000 UT) PO TABS: 2000.0000 [IU] | ORAL_TABLET | Freq: Every day | ORAL | 2 refills | Status: AC

## 2017-04-09 MED ORDER — METFORMIN HCL ER 500 MG PO TB24
500.0000 mg | ORAL_TABLET | Freq: Every day | ORAL | Status: DC
  Administered 2017-04-09 – 2017-04-10 (×2): 500 mg via ORAL
  Filled 2017-04-09 (×2): qty 1

## 2017-04-09 MED ORDER — LIVING WELL WITH DIABETES BOOK
Freq: Once | Status: AC
  Administered 2017-04-09: 17:00:00
  Filled 2017-04-09: qty 1

## 2017-04-09 MED ORDER — METHOCARBAMOL 750 MG PO TABS: 750.0000 mg | ORAL_TABLET | Freq: Four times a day (QID) | ORAL | 1 refills | Status: DC | PRN

## 2017-04-09 MED ORDER — INSULIN ASPART 100 UNIT/ML ~~LOC~~ SOLN
0.0000 [IU] | Freq: Three times a day (TID) | SUBCUTANEOUS | Status: DC
  Administered 2017-04-09: 4 [IU] via SUBCUTANEOUS
  Administered 2017-04-09 – 2017-04-10 (×3): 11 [IU] via SUBCUTANEOUS
  Administered 2017-04-10 (×2): 4 [IU] via SUBCUTANEOUS
  Administered 2017-04-11 (×2): 7 [IU] via SUBCUTANEOUS

## 2017-04-09 MED ORDER — ENOXAPARIN (LOVENOX) PATIENT EDUCATION KIT: 1.0000 | PACK | Freq: Once | 0 refills | Status: AC

## 2017-04-09 MED ORDER — ENOXAPARIN (LOVENOX) PATIENT EDUCATION KIT: 1.0000 | PACK | Freq: Once | 0 refills | Status: DC

## 2017-04-09 MED ORDER — ASCORBIC ACID 500 MG PO TABS: 500.0000 mg | ORAL_TABLET | Freq: Every day | ORAL | 2 refills | Status: DC

## 2017-04-09 NOTE — Evaluation (Signed)
Physical Therapy Evaluation Patient Details Name: Dylan [name] MRN: [id number] DOB: 06-18-1970 Today's Date: 04/09/2017   History of Present Illness  46 y.o. male with DM and L distal 1/3 femoral shaft fracture now s/p IMN 12/14. PMH includes: HTN, Asthma, DM, Hyperlipidemia, Back Surgery    Clinical Impression  Patient is s/p above surgery resulting in functional limitations due to the deficits listed below (see PT Problem List). PTA, patient was independent with all mobility. Pt lives in 2 story home with wife who works during day, with 5 stairs with 1 rail to enter, or 3 stairs without a rail through garage. Upon evaluation, pt presenting with extreme post op pain that is limiting bed mobility, transfers, and gait at this time. Pt currently mod A level for all forms of functional mobility and unable to progress to ambulation. Session focused on transfers from bed to chair. Discussed with patient and wife my concerns for a safe return home tomorrow without significant progress. They state strong preference to return home but realize he is very limited at the moment. Recommending short term SNF but assured patient and family I would re-assess during tomorrows session.  Patient will benefit from skilled PT to increase their independence and safety with mobility to allow discharge to the venue listed below.       Follow Up Recommendations SNF;DC plan and follow up therapy as arranged by surgeon;Supervision/Assistance - 24 hour    Equipment Recommendations  Rolling walker with 5" wheels;3in1 (PT)    Recommendations for Other Services       Precautions / Restrictions Precautions Precautions: Fall Restrictions Weight Bearing Restrictions: Yes LLE Weight Bearing: Touchdown weight bearing      Mobility  Bed Mobility Overal bed mobility: Needs Assistance Bed Mobility: Supine to Sit     Supine to sit: Mod assist     General bed mobility comments: assist with LLE, patient extemly  slow and painful with bed mobility.   Transfers Overall transfer level: Needs assistance Equipment used: Rolling walker (2 wheeled) Transfers: Sit to/from UGI CorporationStand;Stand Pivot Transfers Sit to Stand: Mod assist;+2 physical assistance Stand pivot transfers: Mod assist;+2 physical assistance       General transfer comment: Mod A x2 for sit to stand and SPT at this time. Patient extremly limited by pain and fatigue. cues for safety with RW and sequencing.   Ambulation/Gait                Stairs            Wheelchair Mobility    Modified Rankin (Stroke Patients Only)       Balance Overall balance assessment: Needs assistance Sitting-balance support: Feet unsupported;No upper extremity supported Sitting balance-Leahy Scale: Fair     Standing balance support: During functional activity;Bilateral upper extremity supported Standing balance-Leahy Scale: Poor Standing balance comment: Reliant on UE and external support for static and dynamic balance this time.                             Pertinent Vitals/Pain Pain Assessment: 0-10 Pain Score: 6  Pain Location: L Hip and Knee Pain Descriptors / Indicators: Aching;Grimacing;Guarding;Operative site guarding Pain Intervention(s): Limited activity within patient's tolerance;Premedicated before session;Monitored during session    Home Living Family/patient expects to be discharged to:: Private residence Living Arrangements: Spouse/significant other;Children Available Help at Discharge: Friend(s);Available PRN/intermittently;Family(wife ) Type of Home: House Home Access: Stairs to enter Entrance Stairs-Rails: Right Entrance Stairs-Number of Steps:  3(3 without a rail, and 6 stairs with a rail ) Home Layout: Two level;Able to live on main level with bedroom/bathroom Home Equipment: None Additional Comments: shower has a built in bench in it    Prior Function Level of Independence: Independent                Hand Dominance        Extremity/Trunk Assessment   Upper Extremity Assessment Upper Extremity Assessment: Overall WFL for tasks assessed    Lower Extremity Assessment Lower Extremity Assessment: (RLE strength WNL, LLE strength unable to test due to pain)       Communication      Cognition Arousal/Alertness: Awake/alert Behavior During Therapy: WFL for tasks assessed/performed Overall Cognitive Status: Within Functional Limits for tasks assessed                                        General Comments General comments (skin integrity, edema, etc.): Patient painful with light touch to leg, requested pain medication during session. BP 115/65, SpO2: 98% HR: 95.     Exercises General Exercises - Lower Extremity Ankle Circles/Pumps: AROM;Both;20 reps Quad Sets: AROM;Right;10 reps Heel Slides: PROM;Both;10 reps Hip ABduction/ADduction: AROM;Both;10 reps Straight Leg Raises: AROM;Right;10 reps   Assessment/Plan    PT Assessment Patient needs continued PT services  PT Problem List Decreased strength;Decreased range of motion;Decreased activity tolerance;Decreased balance;Decreased mobility;Decreased knowledge of use of DME;Obesity;Pain       PT Treatment Interventions DME instruction;Gait training;Stair training;Functional mobility training;Therapeutic exercise;Therapeutic activities;Balance training;Neuromuscular re-education    PT Goals (Current goals can be found in the Care Plan section)  Acute Rehab PT Goals Patient Stated Goal: to return home with HHPT PT Goal Formulation: With patient Time For Goal Achievement: 04/16/17 Potential to Achieve Goals: Fair    Frequency Min 5X/week   Barriers to discharge Inaccessible home environment;Decreased caregiver support      Co-evaluation               AM-PAC PT "6 Clicks" Daily Activity  Outcome Measure Difficulty turning over in bed (including adjusting bedclothes, sheets and blankets)?:  Unable Difficulty moving from lying on back to sitting on the side of the bed? : Unable Difficulty sitting down on and standing up from a chair with arms (e.g., wheelchair, bedside commode, etc,.)?: Unable Help needed moving to and from a bed to chair (including a wheelchair)?: A Lot Help needed walking in hospital room?: A Lot Help needed climbing 3-5 steps with a railing? : Total 6 Click Score: 8    End of Session Equipment Utilized During Treatment: Gait belt Activity Tolerance: Patient limited by pain Patient left: in chair;with call bell/phone within reach;with family/visitor present Nurse Communication: Mobility status PT Visit Diagnosis: Unsteadiness on feet (R26.81);Other abnormalities of gait and mobility (R26.89);Pain Pain - Right/Left: Left Pain - part of body: Hip    Time: 1300-1400(extensive time with pain and encourgment ) PT Time Calculation (min) (ACUTE ONLY): 60 min   Charges:   PT Evaluation $PT Eval Moderate Complexity: 1 Mod PT Treatments $Therapeutic Exercise: 8-22 mins $Therapeutic Activity: 23-37 mins   PT G Codes:        Dylan [name], PT, DPT Acute Rehab Services Pager: [phone]    Dylan [name] 04/09/2017, 3:17 PM

## 2017-04-09 NOTE — Progress Notes (Signed)
PROGRESS NOTE    Charlott Hollerichawona S Laramee  RUE:[id number] DOB: December 12, 1970 DOA: 04/07/2017 PCP: Corwin LevinsJohn, [name], MD   Chief Complaint  Patient presents with  . Fall  . Leg Injury    Brief Narrative:  Consulted for diabetes management.  Assessment & Plan   Diabetes mellitus, type II -Patient presented with glucose of 303 -Patient is on oral agents at home including metformin, glipizide, pioglitazone -Hemoglobin A1c 9.9 (A1c was 6 in March 2018) -Continue insulin sliding scale and CBG monitoring- will change ISS to resistant  -upon discharge patient may need to increase his dose of metformin -will order nutrition consult - patient states he tries to be healthy and ate fruit today. Not sure he truly understands what he should be avoding   Essential hypertension -Continue Cozaar  Femoral fracture, left -Orthopedic surgery primary -Continue pain control  Hyperlipidemia -Continue statin  Asthma -Appears to be stable, chest x-ray unremarkable  DVT Prophylaxis  Lovenox  Code Status: Full  Family Communication: None at bedside  Disposition Plan: Admitted by orthopedics  Consultants TR[facility]  Procedures  INTRAMEDULLARY (IM) RETROGRADE FEMORAL NAILING (Left) with [employer] [place] 9 x 420mm statically locked  Antibiotics   Anti-infectives (From admission, onward)   Start     Dose/Rate Route Frequency Ordered Stop   04/08/17 1500  ceFAZolin (ANCEF) IVPB 1 g/50 mL premix     1 g 100 mL/hr over 30 Minutes Intravenous Every 6 hours 04/08/17 1300 04/09/17 0351   04/08/17 0845  ceFAZolin (ANCEF) IVPB 1 g/50 mL premix  Status:  Discontinued     1 g 100 mL/hr over 30 Minutes Intravenous To Surgery 04/08/17 0830 04/08/17 1240   04/08/17 0845  ceFAZolin (ANCEF) IVPB 1 g/50 mL premix  Status:  Discontinued     1 g 100 mL/hr over 30 Minutes Intravenous To Surgery 04/08/17 0830 04/08/17 0831   04/08/17 0845  ceFAZolin (ANCEF) IVPB 2g/100 mL premix  Status:  Discontinued     2 g 200 mL/hr over  30 Minutes Intravenous To Surgery 04/08/17 0831 04/08/17 1240   04/08/17 0800  ceFAZolin (ANCEF) 3 g in dextrose 5 % 50 mL IVPB     3 g 130 mL/hr over 30 Minutes Intravenous To ShortStay Surgical 04/07/17 1730 04/08/17 [id number]      Subjective:   Stoney Bangichawona Buchberger seen and examined today.  Feels pain is mildly better and controlled with pain medication. States he was able to eat some this morning and had fruit. Denies chest pain, shortness of breath, abdominal pain, N/V/D/C.   Objective:   Vitals:   04/08/17 1616 04/08/17 1814 04/08/17 2034 04/09/17 0500  BP: (!) 117/50 129/60 (!) 110/55 (!) 105/59  Pulse: 85 77 86 73  Resp: 18 20 18 18   Temp: 98.3 F (36.8 C) 98.6 F (37 C) 98.2 F (36.8 C) 97.9 F (36.6 C)  TempSrc: Oral Oral Oral Oral  SpO2: 99% 100% 99% 98%  Weight:      Height:        Intake/Output Summary (Last 24 hours) at 04/09/2017 1012 Last data filed at 04/09/2017 0846 Gross per 24 hour  Intake 2918.75 ml  Output 4125 ml  Net -1206.25 ml   Filed Weights   04/07/17 1131  Weight: 131.5 kg (290 lb)   Exam  General: Well developed, well nourished, NAD, appears stated age  HEENT: NCAT,mucous membranes moist.   Cardiovascular: S1 S2 auscultated, RRR, no murmurs  Respiratory: Clear to auscultation bilaterally with equal chest rise  Abdomen: Soft,  obese, nontender, nondistended, + bowel sounds  Extremities: warm dry without cyanosis clubbing or edema  Neuro: AAOx3, nonfocal  Psych: Appropriate and pleasant.   Data Reviewed: I have personally reviewed following labs and imaging studies  CBC: Recent Labs  Lab 04/07/17 1314 04/09/17 0559  WBC 6.6 8.9  NEUTROABS 5.3  --   HGB 13.2 12.2*  HCT 38.1* 36.4*  MCV 84.3 86.3  PLT 166 187   Basic Metabolic Panel: Recent Labs  Lab 04/07/17 1314 04/07/17 1834 04/09/17 0559  NA 136  --  138  K 3.7  --  3.8  CL 103  --  101  CO2 25  --  30  GLUCOSE 303*  --  200*  BUN 10  --  7  CREATININE 0.72  --   0.75  CALCIUM 8.6* 8.3* 8.4*  MG  --  1.8  --   PHOS  --  2.8  --    GFR: Estimated Creatinine Clearance: 161.9 mL/min (by C-G formula based on SCr of 0.75 mg/dL). Liver Function Tests: No results for input(s): AST, ALT, ALKPHOS, BILITOT, PROT, ALBUMIN in the last 168 hours. No results for input(s): LIPASE, AMYLASE in the last 168 hours. No results for input(s): AMMONIA in the last 168 hours. Coagulation Profile: Recent Labs  Lab 04/07/17 1524  INR 1.03   Cardiac Enzymes: No results for input(s): CKTOTAL, CKMB, CKMBINDEX, TROPONINI in the last 168 hours. BNP (last 3 results) No results for input(s): PROBNP in the last 8760 hours. HbA1C: Recent Labs    04/07/17 1314  HGBA1C 9.9*   CBG: Recent Labs  Lab 04/08/17 1144 04/08/17 1231 04/08/17 1654 04/08/17 2037 04/09/17 0737  GLUCAP 312* 290* 359* 264* 194*   Lipid Profile: No results for input(s): CHOL, HDL, LDLCALC, TRIG, CHOLHDL, LDLDIRECT in the last 72 hours. Thyroid Function Tests: Recent Labs    04/07/17 1834  TSH 1.091   Anemia Panel: No results for input(s): VITAMINB12, FOLATE, FERRITIN, TIBC, IRON, RETICCTPCT in the last 72 hours. Urine analysis:    Component Value Date/Time   COLORURINE YELLOW 01/14/2017 1325   APPEARANCEUR CLEAR 01/14/2017 1325   LABSPEC 1.010 01/14/2017 1325   PHURINE 7.0 01/14/2017 1325   GLUCOSEU NEGATIVE 01/14/2017 1325   HGBUR NEGATIVE 01/14/2017 1325   BILIRUBINUR NEGATIVE 01/14/2017 1325   KETONESUR NEGATIVE 01/14/2017 1325   UROBILINOGEN 0.2 01/14/2017 1325   NITRITE NEGATIVE 01/14/2017 1325   LEUKOCYTESUR TRACE (A) 01/14/2017 1325   Sepsis Labs: @LABRCNTIP (procalcitonin:4,lacticidven:4)  ) Recent Results (from the past 240 hour(s))  Surgical pcr screen     Status: Abnormal   Collection Time: 04/08/17  3:00 AM  Result Value Ref Range Status   MRSA, PCR NEGATIVE NEGATIVE Final   Staphylococcus aureus POSITIVE (A) NEGATIVE Final    Comment: (NOTE) The Xpert SA  Assay (FDA approved for NASAL specimens in patients 94 years of age and older), is one component of a comprehensive surveillance program. It is not intended to diagnose infection nor to guide or monitor treatment.       Radiology Studies: Dg Tibia/fibula Left  Result Date: 04/07/2017 CLINICAL DATA:  Fall.  Left leg pain. EXAM: LEFT TIBIA AND FIBULA - 2 VIEW COMPARISON:  None. FINDINGS: Metallic foreign body noted in the lateral proximal calf soft tissues. No underlying bony abnormality. No fracture, subluxation or dislocation. Knee joint and ankle joint are maintained. No joint effusion. IMPRESSION: No acute bony abnormality. Electronically Signed   By: Charlett Nose M.D.   On: 04/07/2017  12:26   Ct Cervical Spine Wo Contrast  Result Date: 04/07/2017 CLINICAL DATA:  Status post fall EXAM: CT CERVICAL SPINE WITHOUT CONTRAST TECHNIQUE: Multidetector CT imaging of the cervical spine was performed without intravenous contrast. Multiplanar CT image reconstructions were also generated. COMPARISON:  None. FINDINGS: Alignment: Normal. Skull base and vertebrae: No acute fracture. No primary bone lesion or focal pathologic process. Soft tissues and spinal canal: No prevertebral fluid or swelling. No visible canal hematoma. Disc levels: Disc spaces are maintained. Broad-based disc bulge at C3-4. No foraminal or central canal stenosis. Upper chest: Lung apices are clear. Other: No fluid collection or hematoma. IMPRESSION: 1.  No acute osseous injury of the cervical spine. Electronically Signed   By: Elige KoHetal  Patel   On: 04/07/2017 12:48   Dg Chest Port 1 View  Result Date: 04/07/2017 CLINICAL DATA:  46 year old male under preoperative evaluation. History of leg fracture. EXAM: PORTABLE CHEST 1 VIEW COMPARISON:  Chest x-ray 02/20/2008. FINDINGS: Mild linear scarring or subsegmental atelectasis in the lower left hemithorax either in the lingula or left lower lobe. Lung volumes are low. No consolidative airspace  disease. No pleural effusions. No evidence pulmonary edema. Heart size is normal. Upper mediastinal contours are within normal limits. IMPRESSION: 1. Mild linear scarring or subsegmental atelectasis in the left lower lung. No radiographic evidence of acute cardiopulmonary disease. Electronically Signed   By: Trudie Reedaniel  Entrikin M.D.   On: 04/07/2017 15:42   Dg C-arm 61-120 Min  Result Date: 04/08/2017 CLINICAL DATA:  Internal fixation EXAM: LEFT FEMUR 2 VIEWS; DG C-ARM 61-120 MIN COMPARISON:  04/07/2017 FINDINGS: Multiple intraoperative spot images demonstrate placement of intramedullary nail across the left femoral shaft fracture. Anatomic alignment. No hardware complicating feature. IMPRESSION: Internal fixation across the left femoral shaft fracture. No visible complicating feature. Electronically Signed   By: Charlett NoseKevin  Dover M.D.   On: 04/08/2017 11:28   Dg Femur Min 2 Views Left  Result Date: 04/08/2017 CLINICAL DATA:  Internal fixation EXAM: LEFT FEMUR 2 VIEWS; DG C-ARM 61-120 MIN COMPARISON:  04/07/2017 FINDINGS: Multiple intraoperative spot images demonstrate placement of intramedullary nail across the left femoral shaft fracture. Anatomic alignment. No hardware complicating feature. IMPRESSION: Internal fixation across the left femoral shaft fracture. No visible complicating feature. Electronically Signed   By: Charlett NoseKevin  Dover M.D.   On: 04/08/2017 11:28   Dg Femur Min 2 Views Left  Result Date: 04/07/2017 CLINICAL DATA:  Fall.  Left femur pain EXAM: LEFT FEMUR 2 VIEWS COMPARISON:  None. FINDINGS: There is a transverse fracture through the left femoral distal shaft. Approximately 1 shaft with lateral displacement of the distal fragments. IMPRESSION: Displaced transverse fracture through the distal left femoral shaft. Electronically Signed   By: Charlett NoseKevin  Dover M.D.   On: 04/07/2017 12:26   Dg Femur Port Min 2 Views Left  Result Date: 04/08/2017 CLINICAL DATA:  Retrograde intramedullary nail fixation  of the femur for fracture. EXAM: LEFT FEMUR PORTABLE 2 VIEWS COMPARISON:  None. FINDINGS: A retrograde intramedullary nail is noted with 3 locking screws distally and 2 proximally spanning a fracture at the junction of the middle and distal third of the left femur. Small amount of intra-articular air from instrumentation is noted within the knee joint. Near-anatomic alignment is noted. IMPRESSION: Retrograde IM nail fixation across a left femoral diaphyseal fracture in near anatomic alignment. Postop change about the knee. Electronically Signed   By: Tollie Ethavid  Kwon M.D.   On: 04/08/2017 15:06     Scheduled Meds: . acetaminophen  1,000 mg Oral Q6H  . calcium carbonate  1 tablet Oral BID WC  . cholecalciferol  2,000 Units Oral BID  . docusate sodium  100 mg Oral BID  . enoxaparin (LOVENOX) injection  40 mg Subcutaneous Q24H  . enoxaparin   Does not apply Once  . insulin aspart  0-20 Units Subcutaneous TID WC  . losartan  25 mg Oral Daily  . metFORMIN  500 mg Oral Q breakfast  . methocarbamol  750 mg Oral QID  . rosuvastatin  40 mg Oral q1800  . vitamin C  500 mg Oral Daily   Continuous Infusions: . 0.9 % NaCl with KCl 20 mEq / L 75 mL/hr at 04/09/17 0321  . methocarbamol (ROBAXIN)  IV       LOS: 2 days   Time Spent in minutes   30 minutes  Ajahnae Rathgeber D.O. on 04/09/2017 at 10:12 AM  Between 7am to 7pm - Pager - [phone]  After 7pm go to [email or web address] - password [email or web address]  And look for the night coverage person covering for me after hours  [facility] Office  [phone]

## 2017-04-09 NOTE — Progress Notes (Signed)
Inpatient Diabetes Program Recommendations  AACE/ADA: New Consensus Statement on Inpatient Glycemic Control (2015)  Target Ranges:  Prepandial:   less than 140 mg/dL      Peak postprandial:   less than 180 mg/dL (1-2 hours)      Critically ill patients:  140 - 180 mg/dL   Lab Results  Component Value Date   GLUCAP 194 (H) 04/09/2017   HGBA1C 9.9 (H) 04/07/2017    Review of Glycemic ControlResults for Charlott HollerBERRY, Thinh S (MRN [id number]) as of 04/09/2017 09:11  Ref. Range 04/08/2017 11:44 04/08/2017 12:31 04/08/2017 16:54 04/08/2017 20:37 04/09/2017 07:37  Glucose-Capillary Latest Ref Range: 65 - 99 mg/dL 409312 (H) 811290 (H) 914359 (H) 264 (H) 194 (H)   Diabetes history: Type 2 DM Outpatient Diabetes medications: Metformin 500 mg daily, Actos 30 mg Daily Current orders for Inpatient glycemic control:  Novolog resistant tid with meals  Inpatient Diabetes Program Recommendations:    Please consider adding Lantus 20 units daily while patient is in the hospital and oral medications are on hold.  Agree that at D/C patient needs increased dose of Metformin due to elevated A1C.  Will talk to patient today.   Thanks,  Beryl MeagerJenny Riti Rollyson, RN, BC-ADM Inpatient Diabetes Coordinator Pager [phone] (8a-5p)

## 2017-04-09 NOTE — Discharge Instructions (Addendum)
Orthopaedic Trauma Service Discharge Instructions   General Discharge Instructions  WEIGHT BEARING STATUS: Touchdown weightbearing left leg  RANGE OF MOTION/ACTIVITY: Unrestricted range of motion left knee and hip.  Please continue to do home exercise program that was reviewed with you 2-3 times a day.  Okay to use exercise bike  Wound Care: Daily wound care starting on arrival to home.  Please see instructions below  Discharge Wound Care Instructions  Do NOT apply any ointments, solutions or lotions to pin sites or surgical wounds.  These prevent needed drainage and even though solutions like hydrogen peroxide kill bacteria, they also damage cells lining the pin sites that help fight infection.  Applying lotions or ointments can keep the wounds moist and can cause them to breakdown and open up as well. This can increase the risk for infection. When in doubt call the office.  Surgical incisions should be dressed daily.  If any drainage is noted, use one layer of adaptic, then gauze, Kerlix, and an ace wrap.  Once the incision is completely dry and without drainage, it may be left open to air out.  Showering may begin 36-48 hours later.  Cleaning gently with soap and water.  Traumatic wounds should be dressed daily as well.    One layer of adaptic, gauze, Kerlix, then ace wrap.  The adaptic can be discontinued once the draining has ceased    If you have a wet to dry dressing: wet the gauze with saline the squeeze as much saline out so the gauze is moist (not soaking wet), place moistened gauze over wound, then place a dry gauze over the moist one, followed by Kerlix wrap, then ace wrap.    DVT/PE prophylaxis: Lovenox 40 mg subcutaneous injection daily x 28 daily  Diet: Be more mindful with diet.  Improved blood sugar control to decrease risk of complications including infection in the bone not healing.  Please limit or eliminate foods including processed/redefined grains and high sugar  foods, also avoid fried foods.  Resources include the [employer]imal Blue Print ([address]Mark [place]), the [employer] Diet (Able Fayrene FearingJames), the National [employer]aleo Solution and Wired to Eat Lavera Guise(Robb Wolf), Unconventional Medicine and Your Personal Paleo Code Ashok Croon(Chris Kresser). These are all book titles as well as websites with an abundance of information.  These individuals also have podcasts as well. Another good resource is Dr. Wylene SimmerJason Fung. He has several books and a podcast  Can use over the counter stool softeners and bowel preparations, such as Miralax, to help with bowel movements.  Narcotics can be constipating.  Be sure to drink plenty of fluids  PAIN MEDICATION USE AND EXPECTATIONS  You have likely been given narcotic medications to help control your pain.  After a traumatic event that results in an fracture (broken bone) with or without surgery, it is ok to use narcotic pain medications to help control one's pain.  We understand that everyone responds to pain differently and each individual patient will be evaluated on a regular basis for the continued need for narcotic medications. Ideally, narcotic medication use should last no more than 6-8 weeks (coinciding with fracture healing).   As a patient it is your responsibility as well to monitor narcotic medication use and report the amount and frequency you use these medications when you come to your office visit.   We would also advise that if you are using narcotic medications, you should take a dose prior to therapy to maximize you participation.  IF YOU ARE ON NARCOTIC MEDICATIONS  IT IS NOT PERMISSIBLE TO OPERATE A MOTOR VEHICLE (MOTORCYCLE/CAR/TRUCK/MOPED) OR HEAVY MACHINERY DO NOT MIX NARCOTICS WITH OTHER CNS (CENTRAL NERVOUS SYSTEM) DEPRESSANTS SUCH AS ALCOHOL   STOP SMOKING OR USING NICOTINE PRODUCTS!!!!  As discussed nicotine severely impairs your body's ability to heal surgical and traumatic wounds but also impairs bone healing.  Wounds and bone heal by forming microscopic  blood vessels (angiogenesis) and nicotine is a vasoconstrictor (essentially, shrinks blood vessels).  Therefore, if vasoconstriction occurs to these microscopic blood vessels they essentially disappear and are unable to deliver necessary nutrients to the healing tissue.  This is one modifiable factor that you can do to dramatically increase your chances of healing your injury.    (This means no smoking, no nicotine gum, patches, etc)  DO NOT USE NONSTEROIDAL ANTI-INFLAMMATORY DRUGS (NSAID'S)  Using products such as Advil (ibuprofen), Aleve (naproxen), Motrin (ibuprofen) for additional pain control during fracture healing can delay and/or prevent the healing response.  If you would like to take over the counter (OTC) medication, Tylenol (acetaminophen) is ok.  However, some narcotic medications that are given for pain control contain acetaminophen as well. Therefore, you should not exceed more than 4000 mg of tylenol in a day if you do not have liver disease.  Also note that there are may OTC medicines, such as cold medicines and allergy medicines that my contain tylenol as well.  If you have any questions about medications and/or interactions please ask your doctor/PA or your pharmacist.      ICE AND ELEVATE INJURED/OPERATIVE EXTREMITY  Using ice and elevating the injured extremity above your heart can help with swelling and pain control.  Icing in a pulsatile fashion, such as 20 minutes on and 20 minutes off, can be followed.    Do not place ice directly on skin. Make sure there is a barrier between to skin and the ice pack.    Using frozen items such as frozen peas works well as the conform nicely to the are that needs to be iced.  USE AN ACE WRAP OR TED HOSE FOR SWELLING CONTROL  In addition to icing and elevation, Ace wraps or TED hose are used to help limit and resolve swelling.  It is recommended to use Ace wraps or TED hose until you are informed to stop.    When using Ace Wraps start the  wrapping distally (farthest away from the body) and wrap proximally (closer to the body)   Example: If you had surgery on your leg or thing and you do not have a splint on, start the ace wrap at the toes and work your way up to the thigh        If you had surgery on your upper extremity and do not have a splint on, start the ace wrap at your fingers and work your way up to the upper arm  IF YOU ARE IN A SPLINT OR CAST DO NOT REMOVE IT FOR ANY REASON   If your splint gets wet for any reason please contact the office immediately. You may shower in your splint or cast as long as you keep it dry.  This can be done by wrapping in a cast cover or garbage back (or similar)  Do Not stick any thing down your splint or cast such as pencils, money, or hangers to try and scratch yourself with.  If you feel itchy take benadryl as prescribed on the bottle for itching  IF YOU ARE IN A CAM BOOT (  BLACK BOOT)  You may remove boot periodically. Perform daily dressing changes as noted below.  Wash the liner of the boot regularly and wear a sock when wearing the boot. It is recommended that you sleep in the boot until told otherwise  CALL THE OFFICE WITH ANY QUESTIONS OR CONCERNS: [phone]

## 2017-04-09 NOTE — Progress Notes (Signed)
Orthopedic Trauma Service Progress Note   Patient ID: Dylan [name] MRN: [id number] DOB/AGE: 46-Nov-1972 46 y.o.  Subjective:  Doing ok  Left leg is sore  Tolerating diet No acute issues   Denies numbness or tingling in left leg  Pt does not check CBGs regularly at home  Has had DM >11 years. Has been on actos for most of that time. 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  Respiratory: Negative for shortness of breath and wheezing.   Cardiovascular: Negative for chest pain and palpitations.  Gastrointestinal: Negative for abdominal pain, nausea and vomiting.  Neurological: Negative for sensory change and speech change.    Objective:   VITALS:   Vitals:   04/08/17 1616 04/08/17 1814 04/08/17 2034 04/09/17 0500  BP: (!) 117/50 129/60 (!) 110/55 (!) 105/59  Pulse: 85 77 86 73  Resp: 18 20 18 18   Temp: 98.3 F (36.8 C) 98.6 F (37 C) 98.2 F (36.8 C) 97.9 F (36.6 C)  TempSrc: Oral Oral Oral Oral  SpO2: 99% 100% 99% 98%  Weight:      Height:        Estimated body mass index is 39.33 kg/m as calculated from the following:   Height as of this encounter: 6' (1.829 m).   Weight as of this encounter: 131.5 kg (290 lb).   Intake/Output      12/13 0701 - 12/14 0700 12/14 0701 - 12/15 0700   P.O. 840 220   I.V. (mL/kg) 2808.8 (21.4)    IV Piggyback 50    Total Intake(mL/kg) 3698.8 (28.1) 220 (1.7)   Urine (mL/kg/hr) 4125 (1.3)    Emesis/NG output     Blood 30    Total Output 4155    Net -456.3 +220          LABS  Results for orders placed or performed during the hospital encounter of 04/07/17 (from the past 24 hour(s))  Glucose, capillary     Status: Abnormal   Collection Time: 04/08/17 11:44 AM  Result Value Ref Range   Glucose-Capillary 312 (H) 65 - 99 mg/dL  Glucose, capillary     Status: Abnormal    Collection Time: 04/08/17 12:31 PM  Result Value Ref Range   Glucose-Capillary 290 (H) 65 - 99 mg/dL  Glucose, capillary     Status: Abnormal   Collection Time: 04/08/17  4:54 PM  Result Value Ref Range   Glucose-Capillary 359 (H) 65 - 99 mg/dL  Glucose, capillary     Status: Abnormal   Collection Time: 04/08/17  8:37 PM  Result Value Ref Range   Glucose-Capillary 264 (H) 65 - 99 mg/dL  Basic metabolic panel     Status: Abnormal   Collection Time: 04/09/17  5:59 AM  Result Value Ref Range   Sodium 138 135 - 145 mmol/L   Potassium 3.8 3.5 - 5.1 mmol/L   Chloride 101 101 - 111 mmol/L   CO2 30 22 - 32 mmol/L   Glucose, Bld 200 (H) 65 - 99 mg/dL   BUN 7 6 - 20 mg/dL   Creatinine, Ser 1.190.75 0.61 - 1.24 mg/dL   Calcium 8.4 (L) 8.9 - 10.3 mg/dL   GFR calc non Af Amer >60 >60 mL/min   GFR calc Af Amer >60 >60 mL/min   Anion gap 7 5 - 15  CBC  Status: Abnormal   Collection Time: 04/09/17  5:59 AM  Result Value Ref Range   WBC 8.9 4.0 - 10.5 K/uL   RBC 4.22 4.22 - 5.81 MIL/uL   Hemoglobin 12.2 (L) 13.0 - 17.0 g/dL   HCT 16.136.4 (L) 09.639.0 - 04.552.0 %   MCV 86.3 78.0 - 100.0 fL   MCH 28.9 26.0 - 34.0 pg   MCHC 33.5 30.0 - 36.0 g/dL   RDW 40.913.9 81.111.5 - 91.415.5 %   Platelets 187 150 - 400 K/uL  Glucose, capillary     Status: Abnormal   Collection Time: 04/09/17  7:37 AM  Result Value Ref Range   Glucose-Capillary 194 (H) 65 - 99 mg/dL    CBG (last 3)  Recent Labs    04/08/17 1654 04/08/17 2037 04/09/17 0737  GLUCAP 359* 264* 194*     PHYSICAL EXAM:   Gen: awake and alert, NAD, talking on phone, appears well Lungs: breathing unlabored, CTA B  Cardiac: RRR, s1 and s2 Abd: + BS, NT Ext:       Left Lower Extremity   Dressing stable  Scant drainage on bandages  TED hose in place  DPN, SPN, TN sensation intact  EHL, FHL, AT, PT, peroneals, gastroc motor intact  + Quad set, + active knee flexion   Ext warm  + DP pulse   No DCT   Assessment/Plan: 1 Day Post-Op   Principal  Problem:   Femur fracture, left (HCC) Active Problems:   Hyperlipidemia   Essential hypertension   Asthma   Diabetes (HCC)   Vitamin D deficiency   Anti-infectives (From admission, onward)   Start     Dose/Rate Route Frequency Ordered Stop   04/08/17 1500  ceFAZolin (ANCEF) IVPB 1 g/50 mL premix     1 g 100 mL/hr over 30 Minutes Intravenous Every 6 hours 04/08/17 1300 04/09/17 0351   04/08/17 0845  ceFAZolin (ANCEF) IVPB 1 g/50 mL premix  Status:  Discontinued     1 g 100 mL/hr over 30 Minutes Intravenous To Surgery 04/08/17 0830 04/08/17 1240   04/08/17 0845  ceFAZolin (ANCEF) IVPB 1 g/50 mL premix  Status:  Discontinued     1 g 100 mL/hr over 30 Minutes Intravenous To Surgery 04/08/17 0830 04/08/17 0831   04/08/17 0845  ceFAZolin (ANCEF) IVPB 2g/100 mL premix  Status:  Discontinued     2 g 200 mL/hr over 30 Minutes Intravenous To Surgery 04/08/17 0831 04/08/17 1240   04/08/17 0800  ceFAZolin (ANCEF) 3 g in dextrose 5 % 50 mL IVPB     3 g 130 mL/hr over 30 Minutes Intravenous To ShortStay Surgical 04/07/17 1730 04/08/17 0925    .  POD/HD#: 1  46 y/o male with L distal 1/3 femoral shaft fracture   - Fall from standing height   -L distal 1/3 femoral shaft fracture s/p IMN  TDWB x 6-8 weeks  Unrestricted ROM L knee and hip   Dressing change tomorrow   Continue with TED hose  PT/OT   PT- please teach HEP for left knee and hip ROM- AROM, PROM. Prone exercises as well. No ROM restrictions.  Quad sets, SLR, LAQ, SAQ, heel slides, stretching, prone flexion and extension  No pillows under bend of knee when at rest, ok to place under heel to help work on extension. Can also use zero knee bone foam if available   There is underlying pathology to pts injury. Sounds like he had been having some antecedent L thigh  pain for several months and was actually getting ready to go to an orthopaedic office on the day he fell for evaluation. The fracture pattern does have a stress component  to it and there are areas of the femur that appear to demonstrate some cortical thickening    Metabolic bone work up is in the process.    Pt does have vitamin D deficiency.  People with dark skin complexion usually do have a normal 25 OH vitamin D level but Dylan [name] level is profoundly low    PTH and TSH levels are ok    Testosterone panel is pending       Long standing DM and Actos use likely contributory. Actos has been shown to inhibit osteoblasts. On the contrary Metformin has been shown to have anti-osteoporotic properties.     Longs standing DM with associated insulin resistance has likely resulted in glycation of collagen tissue making his bone more susceptible to fracture      Will address deficiencies     Vitamin D, calcium and vitamin C started    If pt is testosterone deficient as well I think the best course of action is to refer to a nutritionist who has expertise in primal/paleo nutrition philosophy as I think this is the best way to correct his insulin resistance     Also if there is a component of hypogonadism present would strongly consider using forteo for a 3-6 month course due to its anabolic properties with respect to bone growth     Would also recommend DEXA if hypogonadism present     Also trying to obtain LIPUS (low intensity pulsed ultrasound) bone stimulator to serve as an adjuvant to the healing process  - Pain management:  Continue with current regimen, appears effective  - ABL anemia/Hemodynamics  Stable  Cbc in am   - Medical issues   DM   Restart metformin   Continue SSI     Pt may need referral to endocrine   - DVT/PE prophylaxis:  Lovenox x 28 days   - ID:   periop abx   - Metabolic Bone Disease:  As above  - Activity:  TDWB L leg  Unrestricted ROM L knee and hip   - FEN/GI prophylaxis/Foley/Lines:  Carb mod diet   Continue with IVF   NSL when POs are good   - Impediments to fracture healing:  Vitamin d deficiency   Obesity    Long standing diabetes and associated meds   - Dispo:  PT/OT evals  Anticipate DC home tomorrow   Follow up with ortho in 2-3 weeks      Mearl Latin, PA-C [facility] [phone] (P) [phone] Traci Sermon (C) 04/09/2017, 9:14 AM

## 2017-04-09 NOTE — Progress Notes (Signed)
Spoke briefly with patient regarding DM management.  Reinforced that his A1C is elevated and he will likely need increase of his home DM oral medications.  He is interested in CGM-Freestyle ClaytonLibre for continuous monitoring of blood sugars.  Will give him information on CGM for him to discuss with his PCP.  Also ordered patient "Living Well with DM" booklet.   Thanks,  Beryl MeagerJenny Laiken Nohr, RN, BC-ADM Inpatient Diabetes Coordinator Pager [phone] (8a-5p)

## 2017-04-09 NOTE — Op Note (Signed)
NAMJaquelyn Bitter:  [name], Dylan [name]             ACCOUNT NO.:  [id number]  MEDICAL RECORD NO.:  [id number]  LOCATION:                                 FACILITY:  PHYSICIAN:  Doralee AlbinoMichael H. Carola FrostHandy, M.D.      DATE OF BIRTH:  DATE OF PROCEDURE:  04/08/2017 DATE OF DISCHARGE:                              OPERATIVE REPORT   PREOPERATIVE DIAGNOSIS:  Left femoral shaft fracture, suspected pathologic.  POSTOPERATIVE DIAGNOSIS:  Left femoral shaft fracture, suspected pathologic.  PROCEDURE:  Intramedullary nailing of the left femur with a retrograde Biomet [place] 9 x 420 mm statically locked nail.  SURGEON:  Doralee AlbinoMichael H. Carola FrostHandy, M.D.  ASSISTANTS: 1. Montez MoritaKeith Paul, PA-C. 2. PA student.  ANESTHESIA:  General.  COMPLICATIONS:  None.  ESTIMATED BLOOD LOSS:  50 mL of reamings.  DRAINS:  None.  SPECIMENS:  Reamings one, sent to pathology.  TOURNIQUET:  None.  DISPOSITION:  The patient to PACU.  CONDITION:  Hemodynamically stable.  BRIEF SUMMARY AND INDICATION FOR PROCEDURE:  Dylan [name] is a 46 year old obese male with diabetes, who has had longstanding treatment with Actos among other oral agents.  Recently, the patient has experienced increase in his hemoglobin A1c despite adding oral medications and also has had some left knee pain for which he was going to seek orthopedic evaluation.  The patient fell in the ice and sustained a mildly comminuted transverse femoral shaft fracture at the distal metadiaphysis.  The patient was placed in Buck's traction given the low energy mechanism, which he tolerated reasonably well.  I did see and evaluate the patient as I was on-call and required emergent stabilization.  I did discuss with both him and his wife the risks and benefits of surgical repair with intramedullary nailing including potential for nonunion, malunion, symptomatic hardware, DVT, PE, heart attack, stroke, infection, particularly given his diabetes, and delayed union given the suspected  pathologic nature.  After full discussion of these risk and others, both the patient and his wife strongly wished to proceed.  Prior to proceeding, we did have his insulin adjusted and we were able to get his glucose down to approximately 200 from the high 300s.  He was brought to the operating room, transferred to the OR table and general anesthesia induced.  He received a test dose of cephalosporin because of a history of a penicillin allergy, which he tolerated extremely well and then was given a full 3 g preoperative dose.  Standard prep and drape was performed.  His left lower extremity was positioned with a radiolucent triangle and towel bumps to achieve the fracture length and reduction.  It should be noted that the prep was performed with chlorhexidine scrub, then Betadine scrub and paint.  A 2.5 cm incision was made over the medial aspect of the patellar tendon.  A medial parapatellar arthrotomy performed.  The threaded guidewire positioned just anterior to Blumensaat's line centered within the sulcus.  It was advanced into the center-center position of the distal femur on both AP and lateral images and then the starting reamer used after confirming its position.  We then sequentially reamed from 8-10.5 mm achieving slight cortical chatter as early as 8 and reasonable cortical  chatter at 9.  Consequently, 9 mm x 420 mm nail was inserted and the distal locks were placed.  The impactor was then struck with the mallet to compress the fracture site, which was watched on the lateral projection with outstanding interdigitation and maintenance of alignment on both views. The proximal locks were placed using perfect circle technique, which was extended in time and difficulty because of the patient's large body habitus.  All screws were checked for position within the nail as well as length.  Wounds were irrigated thoroughly, closed in standard layered fashion.  We did send some reamings  to pathology and we will await the report to make sure there is no other oncologic process and that this is primarily a metabolic one.  Montez MoritaKeith Paul, PA-C, assisted me throughout as did an additional PA student.  Dylan [name]'s assistance was absolutely necessary for the safe and effective completion of this case as he was required to hold and maintain reduction during reaming and nail placement.  PROGNOSIS:  Dylan [name]'s vitamin D will be aggressively supplemented as well his calcium.  We anticipate at least a period of insulin use and perhaps a trial of a ketogenic diet to see if this can improve his sensitivity to insulin.  He will be partial weightbearing on the left and will have formal pharmacologic DVT prophylaxis with weight based Lovenox.  He will ultimately require a weightbearing exercise program, in addition to his addressing all of his metabolic issues.  At this time, testosterone value is pending as well.     Doralee AlbinoMichael H. Carola FrostHandy, M.D.   ______________________________ Doralee AlbinoMichael H. Carola FrostHandy, M.D.    MHH/MEDQ  D:  04/08/2017  T:  04/08/2017  Job:  [id number]

## 2017-04-09 NOTE — Progress Notes (Signed)
Tech asked patient if they would like to wash up now. Patient said he would like to wait due to having a visitor come in right before and he would like to hold off on it until around noon. Tech will be back to help wash up. Tech also provided everything needed to wash up.

## 2017-04-09 NOTE — Anesthesia Postprocedure Evaluation (Signed)
Anesthesia Post Note  Patient: Dylan [name]  Procedure(s) Performed: INTRAMEDULLARY (IM) RETROGRADE FEMORAL NAILING (Left Leg Upper)     Patient location during evaluation: PACU Anesthesia Type: General Level of consciousness: awake and alert Pain management: pain level controlled Vital Signs Assessment: post-procedure vital signs reviewed and stable Respiratory status: spontaneous breathing, nonlabored ventilation, respiratory function stable and patient connected to nasal cannula oxygen Cardiovascular status: blood pressure returned to baseline and stable Postop Assessment: no apparent nausea or vomiting Anesthetic complications: no    Last Vitals:  Vitals:   04/09/17 1408 04/09/17 1700  BP: 119/63 125/77  Pulse: 81 78  Resp: 20 20  Temp: 37 C 37.1 C  SpO2: 97% 97%    Last Pain:  Vitals:   04/09/17 1848  TempSrc:   PainSc: Asleep   Pain Goal:                 Shelton SilvasKevin D Analise Glotfelty

## 2017-04-09 NOTE — Plan of Care (Signed)
  RD consulted for nutrition education regarding diabetes.   Lab Results  Component Value Date   HGBA1C 9.9 (H) 04/07/2017   Spoke with pt at bedside, who reports he was first diagnosed with DM approximately 10 years ago. He has been able to maintain control without medications for most of that time, but has been using Actos for the past few years. Per pt, at last MD appointment, he was additionally prescribed metformin due to elevated Hgb A1c (prior to this, pt had been maintaining Hgb A1c in 6 range).   Pt shares he typically consumes one meal per day, which is usually a breakfast item. He will occasionally also consume a small meal later in the evening when his shift ends. He is frustrated with lack of DM control, especially due to receiving conflicting messages regarding self-management. He became very tearful at time of visit and shared with this RD that he has had significant stress over the past 6 months, secondary to his mother's illness which caused a prolonged hospitalization and his work as a Merchandiser, retailsupervisor during the holidays at [employer]. He admits that due to this added stress, he has not been as diligent with his diet and has also been missing doses of his medications. Pt shares that he leaves his pillbox on his bathroom vanity, but will often forgot to take medications because he puts them in his pocket before going downstairs to get ready for work (pt does not like to take pills with water). The majority of session was focused on stress-management and identifying ways in which pt could be more compliant with overall DM-self-management  RD provided "Carbohydrate Counting for People with Diabetes" handout from the Academy of Nutrition and Dietetics. Discussed different food groups and their effects on blood sugar, emphasizing carbohydrate-containing foods. Provided list of carbohydrates and recommended serving sizes of common foods.  Discussed importance of controlled and consistent carbohydrate  intake throughout the day. Provided examples of ways to balance meals/snacks and encouraged intake of high-fiber, whole grain complex carbohydrates. Teach back method used.  Expect fair to good compliance.  Body mass index is 39.33 kg/m. Pt meets criteria for obesity, class II based on current BMI.  Current diet order is Carb modified, patient is consuming approximately 100% of meals at this time. Labs and medications reviewed. No further nutrition interventions warranted at this time. RD contact information provided. If additional nutrition issues arise, please re-consult RD.  Wanya Bangura A. Mayford KnifeWilliams, RD, LDN, CDE Pager: [phone] After hours Pager: [phone]

## 2017-04-10 DIAGNOSIS — E119 Type 2 diabetes mellitus without complications: Secondary | ICD-10-CM

## 2017-04-10 DIAGNOSIS — S72322A Displaced transverse fracture of shaft of left femur, initial encounter for closed fracture: Principal | ICD-10-CM

## 2017-04-10 DIAGNOSIS — J45909 Unspecified asthma, uncomplicated: Secondary | ICD-10-CM

## 2017-04-10 DIAGNOSIS — S7292XA Unspecified fracture of left femur, initial encounter for closed fracture: Secondary | ICD-10-CM

## 2017-04-10 DIAGNOSIS — I10 Essential (primary) hypertension: Secondary | ICD-10-CM

## 2017-04-10 LAB — BASIC METABOLIC PANEL
ANION GAP: 8 (ref 5–15)
BUN: 7 mg/dL (ref 6–20)
CHLORIDE: 99 mmol/L — AB (ref 101–111)
CO2: 29 mmol/L (ref 22–32)
Calcium: 8.2 mg/dL — ABNORMAL LOW (ref 8.9–10.3)
Creatinine, Ser: 0.7 mg/dL (ref 0.61–1.24)
GFR calc non Af Amer: >60 mL/min (ref >60)
Glucose, Bld: 213 mg/dL — ABNORMAL HIGH (ref 65–99)
POTASSIUM: 3.5 mmol/L (ref 3.5–5.1)
SODIUM: 136 mmol/L (ref 135–145)

## 2017-04-10 LAB — CBC
HCT: 33.6 % — ABNORMAL LOW (ref 39.0–52.0)
HEMOGLOBIN: 11.4 g/dL — AB (ref 13.0–17.0)
MCH: 29.5 pg (ref 26.0–34.0)
MCHC: 33.9 g/dL (ref 30.0–36.0)
MCV: 87 fL (ref 78.0–100.0)
PLATELETS: 166 10*3/uL (ref 150–400)
RBC: 3.86 MIL/uL — AB (ref 4.22–5.81)
RDW: 14.1 % (ref 11.5–15.5)
WBC: 6.3 10*3/uL (ref 4.0–10.5)

## 2017-04-10 LAB — GLUCOSE, CAPILLARY
Glucose-Capillary: 197 mg/dL — ABNORMAL HIGH (ref 65–99)
Glucose-Capillary: 262 mg/dL — ABNORMAL HIGH (ref 65–99)
Glucose-Capillary: 195 mg/dL — ABNORMAL HIGH (ref 65–99)
Glucose-Capillary: 188 mg/dL — ABNORMAL HIGH (ref 65–99)

## 2017-04-10 LAB — SEX HORMONE BINDING GLOBULIN: Sex Hormone Binding: 43.2 nmol/L (ref 16.5–55.9)

## 2017-04-10 LAB — TESTOSTERONE, FREE: Testosterone, Free: 1.7 pg/mL — ABNORMAL LOW (ref 6.8–21.5)

## 2017-04-10 LAB — TESTOSTERONE: TESTOSTERONE: 211 ng/dL — AB (ref 264–916)

## 2017-04-10 MED ORDER — GLUCOSE BLOOD VI STRP: ORAL_STRIP | 5 refills | Status: DC

## 2017-04-10 MED ORDER — METFORMIN HCL ER 500 MG PO TB24
500.0000 mg | ORAL_TABLET | Freq: Two times a day (BID) | ORAL | Status: DC
  Administered 2017-04-10 – 2017-04-11 (×2): 500 mg via ORAL
  Filled 2017-04-10 (×2): qty 1

## 2017-04-10 MED ORDER — PIOGLITAZONE HCL 30 MG PO TABS
30.0000 mg | ORAL_TABLET | Freq: Every day | ORAL | Status: DC
  Administered 2017-04-10 – 2017-04-11 (×2): 30 mg via ORAL
  Filled 2017-04-10 (×2): qty 1

## 2017-04-10 MED ORDER — METFORMIN HCL ER 500 MG PO TB24: 500.0000 mg | ORAL_TABLET | Freq: Two times a day (BID) | ORAL | 3 refills | Status: DC

## 2017-04-10 MED ORDER — GLIPIZIDE ER 5 MG PO TB24: 5.0000 mg | ORAL_TABLET | Freq: Every day | ORAL | 1 refills | Status: DC

## 2017-04-10 NOTE — Evaluation (Signed)
Occupational Therapy Evaluation Patient Details Name: ANUSH WIEDEMAN MRN: [id number] DOB: February 23, 1971 Today's Date: 04/10/2017    History of Present Illness 46 y.o. male with DM and L distal 1/3 femoral shaft fracture now s/p IMN 12/14. PMH includes: HTN, Asthma, DM, Hyperlipidemia, Back Surgery   Clinical Impression   PTA pt independent in ADL and mobility. Pt is currently max A for LB ADL, heavy mod A +2 for transfers, and presenting with deficits in balance, activity tolerance, and overall independence. Please see full OT problem list below. Pt will benefit from skilled OT in the acute setting and afterwards at the SNF level to maximize safety and independence in ADL and functional transfers. Next session to introduce AE for LB ADL and continue working on transfers and education.     Follow Up Recommendations  SNF;Supervision/Assistance - 24 hour    Equipment Recommendations  3 in 1 bedside commode;Other (comment)(defer to next venue)    Recommendations for Other Services       Precautions / Restrictions Precautions Precautions: Fall Restrictions Weight Bearing Restrictions: Yes LLE Weight Bearing: Touchdown weight bearing      Mobility Bed Mobility Overal bed mobility: Needs Assistance Bed Mobility: Supine to Sit     Supine to sit: Mod assist     General bed mobility comments: assist with LLE, patient extemly slow and painful with bed mobility.   Transfers Overall transfer level: Needs assistance Equipment used: Rolling walker (2 wheeled) Transfers: Sit to/from [employer] Sit to Stand: Mod assist;+2 physical assistance(heavy mod) Stand pivot transfers: Mod assist;+2 physical assistance(heavy mod)       General transfer comment: Patient continues to be extremly limited by pain and fatigue. cues for safe hand placement with RW and sequencing. very heavy mod A +2 required for transfers    Balance Overall balance assessment: Needs  assistance Sitting-balance support: Feet unsupported;No upper extremity supported Sitting balance-Leahy Scale: Fair Sitting balance - Comments: sitting EOB with no back support   Standing balance support: During functional activity;Bilateral upper extremity supported Standing balance-Leahy Scale: Poor Standing balance comment: continues to be Reliant on UE and external support for static and dynamic balance this time.                           ADL either performed or assessed with clinical judgement   ADL Overall ADL's : Needs assistance/impaired Eating/Feeding: Set up;Sitting Eating/Feeding Details (indicate cue type and reason): able to bring hand to mouth without issue Grooming: Wash/dry hands;Wash/dry face;Set up;Sitting Grooming Details (indicate cue type and reason): EOB Upper Body Bathing: Moderate assistance;Sitting Upper Body Bathing Details (indicate cue type and reason): assist for back Lower Body Bathing: Maximal assistance;Sitting/lateral leans Lower Body Bathing Details (indicate cue type and reason): unable to bend  Upper Body Dressing : Set up   Lower Body Dressing: Maximal assistance Lower Body Dressing Details (indicate cue type and reason): unable to reach down, or bend at knee to access LB for dressing Toilet Transfer: Moderate assistance;+2 for physical assistance;+2 for safety/equipment;Ambulation;RW Toilet Transfer Details (indicate cue type and reason): very short distance Toileting- Clothing Manipulation and Hygiene: Maximal assistance Toileting - Clothing Manipulation Details (indicate cue type and reason): able to clean peri area after urinal, but unable to stand and perform rear peri care     Functional mobility during ADLs: Moderate assistance;+2 for physical assistance;+2 for safety/equipment;Rolling walker General ADL Comments: Pt very limited by pain, decreased balance, safety awareness, dependent on  BUE in standing position, unable to access  LB for ADL at this time     Vision Baseline Vision/History: Wears glasses Wears Glasses: At all times Patient Visual Report: No change from baseline       Perception     Praxis      Pertinent Vitals/Pain Pain Assessment: Faces Faces Pain Scale: Hurts whole lot Pain Location: L Hip and Knee Pain Descriptors / Indicators: Aching;Grimacing;Guarding;Operative site guarding Pain Intervention(s): Monitored during session;Repositioned;Premedicated before session     Hand Dominance Right   Extremity/Trunk Assessment Upper Extremity Assessment Upper Extremity Assessment: Overall WFL for tasks assessed   Lower Extremity Assessment Lower Extremity Assessment: LLE deficits/detail LLE Deficits / Details: post-op deficits in strength and ROM LLE: Unable to fully assess due to pain LLE Coordination: decreased gross motor   Cervical / Trunk Assessment Cervical / Trunk Assessment: Normal   Communication Communication Communication: No difficulties   Cognition Arousal/Alertness: Awake/alert Behavior During Therapy: WFL for tasks assessed/performed Overall Cognitive Status: Within Functional Limits for tasks assessed                                     General Comments  VSS, pain continues to be problematic. Spent significant time educating Pt on SNF vs HH and why therapy is recommending SNF at this time.    Exercises     Shoulder Instructions      Home Living Family/patient expects to be discharged to:: Private residence Living Arrangements: Spouse/significant other;Children Available Help at Discharge: Friend(s);Available PRN/intermittently;Family(wife 2 kids (6 and 9)) Type of Home: House Home Access: Stairs to enter [employer] CorporationEntrance Stairs-Number of Steps: 3(3 without a rail, and 6 stairs with a rail ) Entrance Stairs-Rails: Right Home Layout: Two level;Able to live on main level with bedroom/bathroom     Bathroom Shower/Tub: Producer, television/film/video[place]-in shower   Bathroom Toilet:  Standard Bathroom Accessibility: Yes   Home Equipment: Shower seat - built in          Prior Functioning/Environment Level of Independence: Independent                 OT Problem List: Decreased range of motion;Decreased activity tolerance;Impaired balance (sitting and/or standing)      OT Treatment/Interventions: Self-care/ADL training;Energy conservation;DME and/or AE instruction;Therapeutic activities;Patient/family education;Balance training    OT Goals(Current goals can be found in the care plan section) Acute Rehab OT Goals Patient Stated Goal: get home OT Goal Formulation: With patient Time For Goal Achievement: 04/24/17 Potential to Achieve Goals: Good ADL Goals Pt Will Perform Lower Body Bathing: with supervision;sitting/lateral leans;with adaptive equipment Pt Will Perform Lower Body Dressing: with min assist;with adaptive equipment;with caregiver independent in assisting;sit to/from stand Pt Will Transfer to Toilet: with supervision;ambulating Pt Will Perform Toileting - Clothing Manipulation and hygiene: with min guard assist;with caregiver independent in assisting;sit to/from stand Additional ADL Goal #1: Pt will perform bed mobility at supervision level prior to engaging in ADL activity  OT Frequency: Min 2X/week   Barriers to D/C: Decreased caregiver support  wife is very supportive, but works full time       Co-evaluation PT/OT/SLP Co-Evaluation/Treatment: Yes Reason for Co-Treatment: For Academic librarianpatient/therapist safety;To address functional/ADL transfers PT goals addressed during session: Mobility/safety with mobility OT goals addressed during session: ADL's and self-care      AM-PAC PT "6 Clicks" Daily Activity     Outcome Measure Help from another person eating meals?: None Help from another  person taking care of personal grooming?: A Little Help from another person toileting, which includes using toliet, bedpan, or urinal?: A Lot Help from another  person bathing (including washing, rinsing, drying)?: A Lot Help from another person to put on and taking off regular upper body clothing?: A Little Help from another person to put on and taking off regular lower body clothing?: A Lot 6 Click Score: 16   End of Session Equipment Utilized During Treatment: Gait belt;Rolling walker Nurse Communication: Mobility status;Weight bearing status  Activity Tolerance: Patient limited by pain Patient left: in chair;with call bell/phone within reach  OT Visit Diagnosis: Unsteadiness on feet (R26.81);Other abnormalities of gait and mobility (R26.89);History of falling (Z91.81);Pain Pain - Right/Left: Left Pain - part of body: Leg                Time: [id number] OT Time Calculation (min): 42 min Charges:  OT General Charges $OT Visit: 1 Visit OT Evaluation $OT Eval Moderate Complexity: 1 Mod OT Treatments $Self Care/Home Management : 8-22 mins G-Codes:     Sherryl MangesLaura Dehaven Sine OTR/L [phone]  Evern BioLaura J Shylo Dillenbeck 04/10/2017, 10:24 AM

## 2017-04-10 NOTE — NC FL2 (Signed)
[place] MEDICAID FL2 LEVEL OF CARE SCREENING TOOL     IDENTIFICATION  Patient Name: Dylan [name] Birthdate: Feb 21, 1971 Sex: male Admission Date (Current Location): 04/07/2017  [facility] [place]   Facility and Address:  [facility], [address], [place], [place] [address]      Provider Number: [id number]  Attending Physician Name and Address:  Myrene GalasHandy, Michael, MD  Relative Name and Phone Number:       Current Level of Care: Hospital Recommended Level of Care: Skilled Nursing Facility Prior Approval Number:    Date Approved/Denied:   PASRR Number: [id number]  Discharge Plan: SNF    Current Diagnoses: Patient Active Problem List   Diagnosis Date Noted  . Vitamin D deficiency 04/09/2017  . Metabolic syndrome 04/09/2017  . Femur fracture, left (HCC) 04/07/2017  . Obesity   . Dysuria 01/15/2017  . Mood altered 07/10/2016  . Left facial swelling 01/10/2016  . Bilateral knee pain 07/05/2015  . Psychosis (HCC) 02/07/2013  . Diabetes (HCC) 02/07/2013  . Hearing loss, left 02/07/2013  . Bilateral hearing loss 07/21/2012  . Preventative health care 12/23/2010  . SHOULDER PAIN, LEFT 08/27/2009  . NECK PAIN 08/27/2009  . Other specified forms of hearing loss 05/10/2009  . Essential hypertension 10/25/2008  . Hyperlipidemia 11/25/2007  . CARPAL TUNNEL SYNDROME, LEFT, MILD 11/25/2007  . ALLERGIC RHINITIS 11/25/2007  . Asthma 11/25/2007  . Other symptoms referable to lower leg joint 11/25/2007    Orientation RESPIRATION BLADDER Height & Weight     Self, Time, Situation, Place  Normal Continent Weight: 290 lb (131.5 kg) Height:  6' (182.9 cm)  BEHAVIORAL SYMPTOMS/MOOD NEUROLOGICAL BOWEL NUTRITION STATUS      Continent Diet(carb modified)  AMBULATORY STATUS COMMUNICATION OF NEEDS Skin   Limited Assist Verbally                         Personal Care Assistance Level of Assistance  Bathing, Feeding, Dressing  Bathing Assistance: Limited assistance(+2 physical assistance ) Feeding assistance: Independent Dressing Assistance: Limited assistance(+2 physical assistance )     Functional Limitations Info  Sight, Hearing, Speech Sight Info: Adequate Hearing Info: Adequate Speech Info: Adequate    SPECIAL CARE FACTORS FREQUENCY  PT (By licensed PT), OT (By licensed OT)     PT Frequency: 5x wk OT Frequency: 5x wk            Contractures Contractures Info: Not present    Additional Factors Info  Code Status, Allergies Code Status Info: Full Code Allergies Info: Penicillins            Current Medications (04/10/2017):  This is the current hospital active medication list Current Facility-Administered Medications  Medication Dose Route Frequency Provider Last Rate Last Dose  . 0.9 % NaCl with KCl 20 mEq/ L  infusion   Intravenous Continuous Montez Moritaaul, Keith, PA-C 75 mL/hr at 04/10/17 0757    . acetaminophen (TYLENOL) tablet 1,000 mg  1,000 mg Oral Q6H Montez MoritaPaul, Keith, PA-C   1,000 mg at 04/10/17 0427  . calcium carbonate (OS-CAL - dosed in mg of elemental calcium) tablet 500 mg of elemental calcium  1 tablet Oral BID WC Myrene GalasHandy, Michael, MD   500 mg of elemental calcium at 04/10/17 0812  . cholecalciferol (VITAMIN D) tablet 2,000 Units  2,000 Units Oral BID Montez Moritaaul, Keith, PA-C   2,000 Units at 04/10/17 1038  . docusate sodium (COLACE) capsule 100 mg  100  mg Oral BID Freeman CaldronJeffery, Michael J, PA-C   100 mg at 04/10/17 1041  . enoxaparin (LOVENOX) injection 40 mg  40 mg Subcutaneous Q24H Almon HerculesBaird, Haley P, [place]   40 mg at 04/10/17 [id number]  . hydrALAZINE (APRESOLINE) injection 10 mg  10 mg Intravenous Q6H PRN Emokpae, Courage, MD      . HYDROmorphone (DILAUDID) injection 1 mg  1 mg Intravenous Q3H PRN Montez MoritaPaul, Keith, PA-C   1 mg at 04/10/17 1238  . insulin aspart (novoLOG) injection 0-20 Units  0-20 Units Subcutaneous TID [facility] Edsel PetrinMikhail, Maryann, DO   4 Units at 04/10/17 [id number]  . losartan (COZAAR) tablet 25 mg  25 mg Oral  Daily Edsel PetrinMikhail, Maryann, DO   25 mg at 04/10/17 1040  . metFORMIN (GLUCOPHAGE-XR) 24 hr tablet 500 mg  500 mg Oral BID WC Rai, Ripudeep K, MD      . methocarbamol (ROBAXIN) tablet 750 mg  750 mg Oral QID Montez MoritaPaul, Keith, PA-C   750 mg at 04/10/17 1042   Or  . methocarbamol (ROBAXIN) 500 mg in dextrose 5 % 50 mL IVPB  500 mg Intravenous QID Montez MoritaPaul, Keith, PA-C      . metoCLOPramide (REGLAN) tablet 5-10 mg  5-10 mg Oral Q8H PRN Montez MoritaPaul, Keith, PA-C       Or  . metoCLOPramide (REGLAN) injection 5-10 mg  5-10 mg Intravenous Q8H PRN Montez MoritaPaul, Keith, PA-C      . ondansetron [facility](ZOFRAN) tablet 4 mg  4 mg Oral Q6H PRN Freeman CaldronJeffery, Michael J, PA-C       Or  . ondansetron [facility](ZOFRAN) injection 4 mg  4 mg Intravenous Q6H PRN Freeman CaldronJeffery, Michael J, PA-C   4 mg at 04/08/17 0121  . oxyCODONE (Oxy IR/ROXICODONE) immediate release tablet 5-15 mg  5-15 mg Oral Q4H PRN Freeman CaldronJeffery, Michael J, PA-C   15 mg at 04/10/17 0426  . pioglitazone (ACTOS) tablet 30 mg  30 mg Oral Daily Rai, Ripudeep K, MD   30 mg at 04/10/17 1041  . rosuvastatin (CRESTOR) tablet 40 mg  40 mg Oral q1800 Edsel PetrinMikhail, Maryann, DO   40 mg at 04/09/17 1710  . vitamin C (ASCORBIC ACID) tablet 500 mg  500 mg Oral Daily Montez Moritaaul, Keith, PA-C   500 mg at 04/10/17 1039     Discharge Medications: Please see discharge summary for a list of discharge medications.  Relevant Imaging Results:  Relevant Lab Results:   Additional Information SS # [id number]  Althea CharonAshley C Bettye Sitton, LCSW

## 2017-04-10 NOTE — Progress Notes (Signed)
SPORTS MEDICINE AND JOINT REPLACEMENT  Georgena SpurlingStephen Lucey, MD    Laurier Nancyolby Tsering Leaman, PA-C [address], [place], [place]  [address]                             [phone]   PROGRESS NOTE  Subjective:  negative for Chest Pain  negative for Shortness of Breath  negative for Nausea/Vomiting   negative for Calf Pain  negative for Bowel Movement   Tolerating Diet: yes         Patient reports pain as 5 on 0-10 scale.    Objective: Vital signs in last 24 hours:    Patient Vitals for the past 24 hrs:  BP Temp Temp src Pulse Resp SpO2  04/10/17 0534 120/63 98.2 F (36.8 C) Oral 72 20 100 %  04/09/17 2157 118/74 98.1 F (36.7 C) Oral 72 20 97 %  04/09/17 1700 125/77 98.7 F (37.1 C) Oral 78 20 97 %  04/09/17 1408 119/63 98.6 F (37 C) Oral 81 20 97 %  04/09/17 1032 (!) 103/53 98 F (36.7 C) Oral 83 20 97 %    @flow {1959:LAST@   Intake/Output from previous day:   12/14 0701 - 12/15 0700 In: 2085 [P.O.:660; I.V.:1425] Out: 1900 [Urine:1900]   Intake/Output this shift:   No intake/output data recorded.   Intake/Output      12/14 0701 - 12/15 0700 12/15 0701 - 12/16 0700   P.O. 660    I.V. (mL/kg) 1425 (10.8)    IV Piggyback 0    Total Intake(mL/kg) 2085 (15.9)    Urine (mL/kg/hr) 1900 (0.6)    Blood     Total Output 1900    Net +185            LABORATORY DATA: Recent Labs    04/07/17 1314 04/09/17 0559 04/10/17 0641  WBC 6.6 8.9 6.3  HGB 13.2 12.2* 11.4*  HCT 38.1* 36.4* 33.6*  PLT 166 187 166   Recent Labs    04/07/17 1314 04/07/17 1834 04/09/17 0559 04/10/17 0641  NA 136  --  138 136  K 3.7  --  3.8 3.5  CL 103  --  101 99*  CO2 25  --  30 29  BUN 10  --  7 7  CREATININE 0.72  --  0.75 0.70  GLUCOSE 303*  --  200* 213*  CALCIUM 8.6* 8.3* 8.4* 8.2*   Lab Results  Component Value Date   INR 1.03 04/07/2017    Examination:  General appearance: alert, cooperative and no distress Extremities: extremities normal, atraumatic, no cyanosis or  edema  Wound Exam: clean, dry, intact   Drainage:  None: wound tissue dry  Motor Exam: Quadriceps and Hamstrings Intact  Sensory Exam: Superficial Peroneal, Deep Peroneal and Tibial normal   Assessment:    2 Days Post-Op  Procedure(s) (LRB): INTRAMEDULLARY (IM) RETROGRADE FEMORAL NAILING (Left)  ADDITIONAL DIAGNOSIS:  Principal Problem:   Femur fracture, left (HCC) Active Problems:   Hyperlipidemia   Essential hypertension   Asthma   Diabetes (HCC)   Vitamin D deficiency   Metabolic syndrome     Plan: Physical Therapy as ordered Touch Down Weight Bearing (TDWB)  DVT Prophylaxis:  Lovenox  DISCHARGE PLAN: home vs SNF  DISCHARGE NEEDS: HHPT   POD#2 from IM femur nail. Patient was working with PT on arrival. TDWB. Will see how progression with PT is to determine D/C date and location.  Guy SandiferColby Alan Lenita Peregrina 04/10/2017, 9:35 AM

## 2017-04-10 NOTE — Progress Notes (Signed)
Physical Therapy Treatment Patient Details Name: Dylan [name] MRN: [id number] DOB: Jun 11, 1970 Today's Date: 04/10/2017    History of Present Illness 46 y.o. male with DM and L distal 1/3 femoral shaft fracture now s/p IMN 12/14. PMH includes: HTN, Asthma, DM, Hyperlipidemia, Back Surgery    PT Comments    Session focused on progressing OOB mobility. Pt now ambulating in hospital room with extreme pain and difficulty.  Still mod A level for bed mobility and transfers. Patient and wife educated on benefits of short term facility placement and are now agreeable given current status and concerns for safe return to home. Continuing recommendation for SNF at this time.    Follow Up Recommendations  SNF;DC plan and follow up therapy as arranged by surgeon;Supervision/Assistance - 24 hour     Equipment Recommendations  Rolling walker with 5" wheels;3in1 (PT)    Recommendations for Other Services       Precautions / Restrictions Precautions Precautions: Fall Restrictions Weight Bearing Restrictions: Yes LLE Weight Bearing: Touchdown weight bearing    Mobility  Bed Mobility Overal bed mobility: Needs Assistance Bed Mobility: Supine to Sit     Supine to sit: Mod assist;+2 for physical assistance     General bed mobility comments: assist with LLE, patient extemly slow and painful with bed mobility.   Transfers Overall transfer level: Needs assistance Equipment used: Rolling walker (2 wheeled) Transfers: Sit to/from [employer] CorporationStand;Stand Pivot Transfers Sit to Stand: Mod assist;+2 physical assistance Stand pivot transfers: Mod assist;+2 physical assistance       General transfer comment: Patient continues to be extremly limited by pain and fatigue. cues for safe hand placement with RW and sequencing. very heavy mod A +2 required for transfers  Ambulation/Gait Ambulation/Gait assistance: Min assist;Min guard Ambulation Distance (Feet): 15 Feet Assistive device: Rolling walker (2  wheeled) Gait Pattern/deviations: Step-to pattern;Antalgic     General Gait Details: Pt ambulating extremly slow and painfully at this time. Unable to utilize full hop-to gait and prefers heel toe scooting on RLE. Limited by pain and fatigue   Stairs Stairs: (unable to progress to stair training)          Wheelchair Mobility    Modified Rankin (Stroke Patients Only)       Balance Overall balance assessment: Needs assistance Sitting-balance support: Feet unsupported;No upper extremity supported Sitting balance-Leahy Scale: Fair Sitting balance - Comments: sitting EOB with no back support   Standing balance support: During functional activity;Bilateral upper extremity supported Standing balance-Leahy Scale: Poor Standing balance comment: continues to be Reliant on UE and external support for static and dynamic balance this time.                            Cognition Arousal/Alertness: Awake/alert Behavior During Therapy: WFL for tasks assessed/performed Overall Cognitive Status: Within Functional Limits for tasks assessed                                        Exercises      General Comments General comments (skin integrity, edema, etc.): Extensive discussion over discharge dipo, benefits of SNF over HHPT.       Pertinent Vitals/Pain Pain Assessment: Faces Faces Pain Scale: Hurts whole lot Pain Location: L Hip and Knee Pain Descriptors / Indicators: Aching;Grimacing;Guarding;Operative site guarding Pain Intervention(s): Limited activity within patient's tolerance;Monitored during session;Premedicated before session  Home Living Family/patient expects to be discharged to:: Private residence Living Arrangements: Spouse/significant other;Children Available Help at Discharge: Friend(s);Available PRN/intermittently;Family(wife 2 kids (6 and 9)) Type of Home: House Home Access: Stairs to enter Entrance Stairs-Rails: Right Home Layout:  Two level;Able to live on main level with bedroom/bathroom Home Equipment: Shower seat - built in      Prior Function Level of Independence: Independent          PT Goals (current goals can now be found in the care plan section) Acute Rehab PT Goals Patient Stated Goal: get home PT Goal Formulation: With patient/family Time For Goal Achievement: 04/16/17 Potential to Achieve Goals: Fair Progress towards PT goals: Progressing toward goals    Frequency    Min 3X/week      PT Plan Current plan remains appropriate    Co-evaluation PT/OT/SLP Co-Evaluation/Treatment: Yes Reason for Co-Treatment: For patient/therapist safety PT goals addressed during session: Proper use of DME;Strengthening/ROM;Mobility/safety with mobility OT goals addressed during session: ADL's and self-care      AM-PAC PT "6 Clicks" Daily Activity  Outcome Measure  Difficulty turning over in bed (including adjusting bedclothes, sheets and blankets)?: Unable Difficulty moving from lying on back to sitting on the side of the bed? : Unable Difficulty sitting down on and standing up from a chair with arms (e.g., wheelchair, bedside commode, etc,.)?: Unable Help needed moving to and from a bed to chair (including a wheelchair)?: A Lot Help needed walking in hospital room?: A Lot Help needed climbing 3-5 steps with a railing? : Total 6 Click Score: 8    End of Session Equipment Utilized During Treatment: Gait belt Activity Tolerance: Patient limited by pain Patient left: in chair;with call bell/phone within reach;with family/visitor present Nurse Communication: Mobility status PT Visit Diagnosis: Unsteadiness on feet (R26.81);Other abnormalities of gait and mobility (R26.89);Pain Pain - Right/Left: Left Pain - part of body: Hip     Time: 0911-0957 PT Time Calculation (min) (ACUTE ONLY): 46 min  Charges:  $Gait Training: 8-22 mins $Self Care/Home Management: 8-22                    G Codes:        Dylan [name], PT, DPT Acute Rehab Services Pager: [phone]     Dylan GrandchildSean  Greyson [name] 04/10/2017, 11:43 AM

## 2017-04-10 NOTE — Clinical Social Work Note (Signed)
Clinical Social Work Assessment  Patient Details  Name: Dylan [name] MRN: [id number] Date of Birth: Aug 20, 1970  Date of referral:  04/10/17               Reason for consult:  Discharge Planning, Facility Placement                Permission sought to share information with:  Family Supports Permission granted to share information::  Yes, Verbal Permission Granted  Name::        Agency::     Relationship::     Contact Information:     Housing/Transportation Living arrangements for the past 2 months:  Single Family Home Source of Information:  Patient Patient Interpreter Needed:  None Criminal Activity/Legal Involvement Pertinent to Current Situation/Hospitalization:  No - Comment as needed Significant Relationships:  Dependent Children, Spouse Lives with:  Spouse, Minor Children Do you feel safe going back to the place where you live?  Yes Need for family participation in patient care:  No (Coment)  Care giving concerns: Patients family and children were at bedside during assessment.    Social Worker assessment / plan:  CSW met patient/family at bedside to offer support and discuss disposition plan. Patients family stated they agree with patient discharging to a SNF to do short term rehab. Patients spouse stated since the children are still in school and she's working, no one will be available at home to assist patient with care. CSW to follow up with family once beds are available  Employment status:  [employer] information:  Other (Comment Required)([employer]) PT Recommendations:  [place] / Referral to community resources:  [place]  Patient/Family's Response to care:  Family had questions about patients transition to rehab, CSW assisted family with questions.   Patient/Family's Understanding of and Emotional Response to Diagnosis, Current Treatment, and Prognosis:  Family supportive of patient and his needs. Patient aware of  recent hospitalization and current limitations.   Emotional Assessment Appearance:  Appears stated age Attitude/Demeanor/Rapport:  Other Affect (typically observed):  Appropriate Orientation:  Oriented to  Time, Oriented to Situation, Oriented to Place, Oriented to Self Alcohol / Substance use:  Not Applicable Psych involvement (Current and /or in the community):  No (Comment)  Discharge Needs  Concerns to be addressed:  No discharge needs identified Readmission within the last 30 days:  No Current discharge risk:  None Barriers to Discharge:  No Barriers Identified   Wende Neighbors, LCSW 04/10/2017, 10:55 AM

## 2017-04-10 NOTE — Progress Notes (Signed)
PT rec SNF yesterday and today. Spoke Kandee Keenory PA agree patient is more appropriate for SNF. Notified CSW Morrie Sheldonshley and placed CSW consult for SNF placement

## 2017-04-10 NOTE — Progress Notes (Signed)
Consult progress note [facility]                                                                              Patient Demographics  Dylan [name], is a 46 y.o. male, DOB - 11/18/1970, WGN:[id number]  Admit date - 04/07/2017   Admitting Physician Dylan GalasMichael Handy, MD  Outpatient Primary MD for the patient is Dylan LevinsJohn, [name], MD  Outpatient specialists:   LOS - 3  days   Medical records reviewed and are as summarized below:    Chief Complaint  Patient presents with  . Fall  . Leg Injury       Brief summary   Consulted for diabetes management    Assessment & Plan    Principal Problem:   Femur fracture, left (HCC) -Per primary service, status post intramedullary retrograde nailing,  Active Problems: Diabetes mellitus type 2 -Uncontrolled, hemoglobin A1c 9.9 -Patient on oral agents at home metformin glipizide and Actos -Increase metformin to 500 mg twice a day, restarted Actos 30 mg daily, continue sliding scale insulin while inpatient. resume glipizide 5 mg daily on discharge -I have adjusted all his oral hypoglycemic medications in the AVS for discharge.  I also gave him a prescription for lancets strips.  Follow-up outpatient with Dr. Oliver BarreJames [name], will need hemoglobin A1c in 2 months -Diabetic coordinator following    Hyperlipidemia -Continue statin    Essential hypertension -Continue Cozaar    Asthma -Stable, chest x-ray unremarkable, no wheezing   Code Status: full code  DVT Prophylaxis: Lovenox Family Communication: Discussed in detail with the patient, all imaging results, lab results explained to the patient   Disposition Plan: Patient is stable for discharge, will sign off  Time Spent in minutes   25 minutes  Procedures:  Intramedullary retrograde femoral nailing, left    Medications  Scheduled Meds: . acetaminophen  1,000 mg Oral Q6H  . calcium carbonate  1 tablet Oral BID WC  . cholecalciferol  2,000 Units Oral BID  .  docusate sodium  100 mg Oral BID  . enoxaparin (LOVENOX) injection  40 mg Subcutaneous Q24H  . insulin aspart  0-20 Units Subcutaneous TID WC  . losartan  25 mg Oral Daily  . metFORMIN  500 mg Oral BID WC  . methocarbamol  750 mg Oral QID  . pioglitazone  30 mg Oral Daily  . rosuvastatin  40 mg Oral q1800  . vitamin C  500 mg Oral Daily   Continuous Infusions: . 0.9 % NaCl with KCl 20 mEq / L 75 mL/hr at 04/10/17 0757  . methocarbamol (ROBAXIN)  IV     PRN Meds:.hydrALAZINE, HYDROmorphone (DILAUDID) injection, metoCLOPramide **OR** metoCLOPramide (REGLAN) injection, ondansetron **OR** ondansetron (ZOFRAN) IV, oxyCODONE   Antibiotics   Anti-infectives (From admission, onward)   Start     Dose/Rate Route Frequency Ordered Stop   04/08/17 1500  ceFAZolin (ANCEF) IVPB 1 g/50 mL premix     1 g 100 mL/hr over 30 Minutes Intravenous Every 6 hours 04/08/17 1300 04/09/17 0351   04/08/17 0845  ceFAZolin (ANCEF) IVPB 1 g/50 mL premix  Status:  Discontinued     1 g  100 mL/hr over 30 Minutes Intravenous To Surgery 04/08/17 0830 04/08/17 1240   04/08/17 0845  ceFAZolin (ANCEF) IVPB 1 g/50 mL premix  Status:  Discontinued     1 g 100 mL/hr over 30 Minutes Intravenous To Surgery 04/08/17 0830 04/08/17 0831   04/08/17 0845  ceFAZolin (ANCEF) IVPB 2g/100 mL premix  Status:  Discontinued     2 g 200 mL/hr over 30 Minutes Intravenous To Surgery 04/08/17 0831 04/08/17 1240   04/08/17 0800  ceFAZolin (ANCEF) 3 g in dextrose 5 % 50 mL IVPB     3 g 130 mL/hr over 30 Minutes Intravenous To ShortStay Surgical 04/07/17 1730 04/08/17 [id number]        Subjective:   Dylan [name] was seen and examined today.  No acute complaints, still has pain in the left leg. Patient denies dizziness, chest pain, shortness of breath, abdominal pain, N/V/D/C, new weakness, numbess, tingling. No acute events overnight.    Objective:   Vitals:   04/09/17 1408 04/09/17 1700 04/09/17 2157 04/10/17 0534  BP: 119/63  125/77 118/74 120/63  Pulse: 81 78 72 72  Resp: 20 20 20 20   Temp: 98.6 F (37 C) 98.7 F (37.1 C) 98.1 F (36.7 C) 98.2 F (36.8 C)  TempSrc: Oral Oral Oral Oral  SpO2: 97% 97% 97% 100%  Weight:      Height:        Intake/Output Summary (Last 24 hours) at 04/10/2017 1006 Last data filed at 04/10/2017 0100 Gross per 24 hour  Intake 1865 ml  Output 1900 ml  Net -35 ml     Wt Readings from Last 3 Encounters:  04/07/17 131.5 kg (290 lb)  01/15/17 (!) 137 kg (302 lb)  07/10/16 126.6 kg (279 lb)     Exam  General: Alert and oriented x 3, NAD  Eyes:   HEENT:    Cardiovascular: S1 S2 auscultated, no rubs, murmurs or gallops. Regular rate and rhythm.  Respiratory: Clear to auscultation bilaterally, no wheezing, rales or rhonchi  Gastrointestinal: Soft, nontender, nondistended, + bowel sounds  Ext: no pedal edema bilaterally  Neuro: no new deficit  Musculoskeletal: No digital cyanosis, clubbing  Skin: No rashes  Psych: Normal affect and demeanor, alert and oriented x3    Data Reviewed:  I have personally reviewed following labs and imaging studies  Micro Results Recent Results (from the past 240 hour(s))  Surgical pcr screen     Status: Abnormal   Collection Time: 04/08/17  3:00 AM  Result Value Ref Range Status   MRSA, PCR NEGATIVE NEGATIVE Final   Staphylococcus aureus POSITIVE (A) NEGATIVE Final    Comment: (NOTE) The Xpert SA Assay (FDA approved for NASAL specimens in patients 46 years of age and older), is one component of a comprehensive surveillance program. It is not intended to diagnose infection nor to guide or monitor treatment.     Radiology Reports Dg Tibia/fibula Left  Result Date: 04/07/2017 CLINICAL DATA:  Fall.  Left leg pain. EXAM: LEFT TIBIA AND FIBULA - 2 VIEW COMPARISON:  None. FINDINGS: Metallic foreign body noted in the lateral proximal calf soft tissues. No underlying bony abnormality. No fracture, subluxation or dislocation.  Knee joint and ankle joint are maintained. No joint effusion. IMPRESSION: No acute bony abnormality. Electronically Signed   By: Charlett NoseKevin  Dover M.D.   On: 04/07/2017 12:26   Ct Cervical Spine Wo Contrast  Result Date: 04/07/2017 CLINICAL DATA:  Status post fall EXAM: CT CERVICAL SPINE WITHOUT CONTRAST TECHNIQUE: Multidetector  CT imaging of the cervical spine was performed without intravenous contrast. Multiplanar CT image reconstructions were also generated. COMPARISON:  None. FINDINGS: Alignment: Normal. Skull base and vertebrae: No acute fracture. No primary bone lesion or focal pathologic process. Soft tissues and spinal canal: No prevertebral fluid or swelling. No visible canal hematoma. Disc levels: Disc spaces are maintained. Broad-based disc bulge at C3-4. No foraminal or central canal stenosis. Upper chest: Lung apices are clear. Other: No fluid collection or hematoma. IMPRESSION: 1.  No acute osseous injury of the cervical spine. Electronically Signed   By: Elige Ko   On: 04/07/2017 12:48   Dg Chest Port 1 View  Result Date: 04/07/2017 CLINICAL DATA:  46 year old male under preoperative evaluation. History of leg fracture. EXAM: PORTABLE CHEST 1 VIEW COMPARISON:  Chest x-ray 02/20/2008. FINDINGS: Mild linear scarring or subsegmental atelectasis in the lower left hemithorax either in the lingula or left lower lobe. Lung volumes are low. No consolidative airspace disease. No pleural effusions. No evidence pulmonary edema. Heart size is normal. Upper mediastinal contours are within normal limits. IMPRESSION: 1. Mild linear scarring or subsegmental atelectasis in the left lower lung. No radiographic evidence of acute cardiopulmonary disease. Electronically Signed   By: Trudie Reed M.D.   On: 04/07/2017 15:42   Dg C-arm 61-120 Min  Result Date: 04/08/2017 CLINICAL DATA:  Internal fixation EXAM: LEFT FEMUR 2 VIEWS; DG C-ARM 61-120 MIN COMPARISON:  04/07/2017 FINDINGS: Multiple intraoperative  spot images demonstrate placement of intramedullary nail across the left femoral shaft fracture. Anatomic alignment. No hardware complicating feature. IMPRESSION: Internal fixation across the left femoral shaft fracture. No visible complicating feature. Electronically Signed   By: Charlett Nose M.D.   On: 04/08/2017 11:28   Dg Femur Min 2 Views Left  Result Date: 04/08/2017 CLINICAL DATA:  Internal fixation EXAM: LEFT FEMUR 2 VIEWS; DG C-ARM 61-120 MIN COMPARISON:  04/07/2017 FINDINGS: Multiple intraoperative spot images demonstrate placement of intramedullary nail across the left femoral shaft fracture. Anatomic alignment. No hardware complicating feature. IMPRESSION: Internal fixation across the left femoral shaft fracture. No visible complicating feature. Electronically Signed   By: Charlett Nose M.D.   On: 04/08/2017 11:28   Dg Femur Min 2 Views Left  Result Date: 04/07/2017 CLINICAL DATA:  Fall.  Left femur pain EXAM: LEFT FEMUR 2 VIEWS COMPARISON:  None. FINDINGS: There is a transverse fracture through the left femoral distal shaft. Approximately 1 shaft with lateral displacement of the distal fragments. IMPRESSION: Displaced transverse fracture through the distal left femoral shaft. Electronically Signed   By: Charlett Nose M.D.   On: 04/07/2017 12:26   Dg Femur Port Min 2 Views Left  Result Date: 04/08/2017 CLINICAL DATA:  Retrograde intramedullary nail fixation of the femur for fracture. EXAM: LEFT FEMUR PORTABLE 2 VIEWS COMPARISON:  None. FINDINGS: A retrograde intramedullary nail is noted with 3 locking screws distally and 2 proximally spanning a fracture at the junction of the middle and distal third of the left femur. Small amount of intra-articular air from instrumentation is noted within the knee joint. Near-anatomic alignment is noted. IMPRESSION: Retrograde IM nail fixation across a left femoral diaphyseal fracture in near anatomic alignment. Postop change about the knee. Electronically  Signed   By: Tollie Eth M.D.   On: 04/08/2017 15:06    Lab Data:  CBC: Recent Labs  Lab 04/07/17 1314 04/09/17 0559 04/10/17 0641  WBC 6.6 8.9 6.3  NEUTROABS 5.3  --   --   HGB 13.2 12.2*  11.4*  HCT 38.1* 36.4* 33.6*  MCV 84.3 86.3 87.0  PLT 166 187 166   Basic Metabolic Panel: Recent Labs  Lab 04/07/17 1314 04/07/17 1834 04/09/17 0559 04/10/17 0641  NA 136  --  138 136  K 3.7  --  3.8 3.5  CL 103  --  101 99*  CO2 25  --  30 29  GLUCOSE 303*  --  200* 213*  BUN 10  --  7 7  CREATININE 0.72  --  0.75 0.70  CALCIUM 8.6* 8.3* 8.4* 8.2*  MG  --  1.8  --   --   PHOS  --  2.8  --   --    GFR: Estimated Creatinine Clearance: 161.9 mL/min (by C-G formula based on SCr of 0.7 mg/dL). Liver Function Tests: No results for input(s): AST, ALT, ALKPHOS, BILITOT, PROT, ALBUMIN in the last 168 hours. No results for input(s): LIPASE, AMYLASE in the last 168 hours. No results for input(s): AMMONIA in the last 168 hours. Coagulation Profile: Recent Labs  Lab 04/07/17 1524  INR 1.03   Cardiac Enzymes: No results for input(s): CKTOTAL, CKMB, CKMBINDEX, TROPONINI in the last 168 hours. BNP (last 3 results) No results for input(s): PROBNP in the last 8760 hours. HbA1C: Recent Labs    04/07/17 1314  HGBA1C 9.9*   CBG: Recent Labs  Lab 04/09/17 0737 04/09/17 1215 04/09/17 1658 04/09/17 2156 04/10/17 0751  GLUCAP 194* 256* 276* 178* 197*   Lipid Profile: No results for input(s): CHOL, HDL, LDLCALC, TRIG, CHOLHDL, LDLDIRECT in the last 72 hours. Thyroid Function Tests: Recent Labs    04/07/17 1834  TSH 1.091   Anemia Panel: No results for input(s): VITAMINB12, FOLATE, FERRITIN, TIBC, IRON, RETICCTPCT in the last 72 hours. Urine analysis:    Component Value Date/Time   COLORURINE YELLOW 01/14/2017 1325   APPEARANCEUR CLEAR 01/14/2017 1325   LABSPEC 1.010 01/14/2017 1325   PHURINE 7.0 01/14/2017 1325   GLUCOSEU NEGATIVE 01/14/2017 1325   HGBUR NEGATIVE  01/14/2017 1325   BILIRUBINUR NEGATIVE 01/14/2017 1325   KETONESUR NEGATIVE 01/14/2017 1325   UROBILINOGEN 0.2 01/14/2017 1325   NITRITE NEGATIVE 01/14/2017 1325   LEUKOCYTESUR TRACE (A) 01/14/2017 1325     Ripudeep Rai M.D. [facility] 04/10/2017, 10:06 AM  Pager: [phone] Between 7am to 7pm - call Pager - [phone]  After 7pm go to [email or web address] - password [email or web address]  Call night coverage person covering after 7pm

## 2017-04-11 DIAGNOSIS — S728X9A Other fracture of unspecified femur, initial encounter for closed fracture: Secondary | ICD-10-CM | POA: Diagnosis not present

## 2017-04-11 DIAGNOSIS — Z4789 Encounter for other orthopedic aftercare: Secondary | ICD-10-CM | POA: Diagnosis not present

## 2017-04-11 DIAGNOSIS — M6281 Muscle weakness (generalized): Secondary | ICD-10-CM | POA: Diagnosis not present

## 2017-04-11 DIAGNOSIS — S72322D Displaced transverse fracture of shaft of left femur, subsequent encounter for closed fracture with routine healing: Secondary | ICD-10-CM | POA: Diagnosis not present

## 2017-04-11 DIAGNOSIS — G8911 Acute pain due to trauma: Secondary | ICD-10-CM | POA: Diagnosis not present

## 2017-04-11 LAB — CBC
HEMATOCRIT: 35.6 % — AB (ref 39.0–52.0)
HEMOGLOBIN: 12 g/dL — AB (ref 13.0–17.0)
MCH: 28.9 pg (ref 26.0–34.0)
MCHC: 33.7 g/dL (ref 30.0–36.0)
MCV: 85.8 fL (ref 78.0–100.0)
Platelets: 180 10*3/uL (ref 150–400)
RBC: 4.15 MIL/uL — AB (ref 4.22–5.81)
RDW: 13.8 % (ref 11.5–15.5)
WBC: 6.6 10*3/uL (ref 4.0–10.5)

## 2017-04-11 LAB — BASIC METABOLIC PANEL
ANION GAP: 8 (ref 5–15)
BUN: 9 mg/dL (ref 6–20)
CALCIUM: 8.7 mg/dL — AB (ref 8.9–10.3)
CO2: 29 mmol/L (ref 22–32)
Chloride: 98 mmol/L — ABNORMAL LOW (ref 101–111)
Creatinine, Ser: 0.67 mg/dL (ref 0.61–1.24)
GLUCOSE: 194 mg/dL — AB (ref 65–99)
POTASSIUM: 3.7 mmol/L (ref 3.5–5.1)
Sodium: 135 mmol/L (ref 135–145)

## 2017-04-11 LAB — GLUCOSE, CAPILLARY
GLUCOSE-CAPILLARY: 212 mg/dL — AB (ref 65–99)
GLUCOSE-CAPILLARY: 237 mg/dL — AB (ref 65–99)

## 2017-04-11 NOTE — Progress Notes (Signed)
Clinical Social Worker facilitated patient discharge including contacting patient family and facility to confirm patient discharge plans.  Clinical information faxed to facility and family agreeable with plan.  [facility]  RN to call [phone] for report prior to discharge.  Clinical Social Worker will sign off for now as social work intervention is no longer needed. Please consult us again if new need arises.  Marrianne MoodAshley Joni Colegrove, MSW, [employer]CSWA [phone]

## 2017-04-11 NOTE — Discharge Summary (Signed)
SPORTS MEDICINE & JOINT REPLACEMENT   Dylan Mulch, MD   Dylan Shadow, PA-C [address], [place], [place]  [address]                             [phone]  PATIENT ID: Dylan [name]        MRN:  [id number]          DOB/AGE: 1971-04-05 / 46 y.o.    DISCHARGE SUMMARY  ADMISSION DATE:    04/07/2017 DISCHARGE DATE:   04/11/2017   ADMISSION DIAGNOSIS: Surgery follow-up [Z09] Closed displaced transverse fracture of shaft of left femur, initial encounter ([place]) [S72.322A] Femur fracture ([place]) [S72.90XA]    DISCHARGE DIAGNOSIS:  left femur fracture    ADDITIONAL DIAGNOSIS: Principal Problem:   Femur fracture, left (HCC) Active Problems:   Hyperlipidemia   Essential hypertension   Asthma   Diabetes ([place])   Vitamin D deficiency   Metabolic syndrome  Past Medical History:  Diagnosis Date  . ALLERGIC RHINITIS 11/25/2007  . ASTHMA 11/25/2007  . Asthma   . CARPAL TUNNEL SYNDROME, LEFT, MILD 11/25/2007  . DIABETES MELLITUS, TYPE II 11/25/2007  . Femur fracture, left ([facility]) 04/07/2017  . HYPERLIPIDEMIA 11/25/2007  . Metabolic syndrome 18/29/9371  . NECK PAIN 08/27/2009  . Obesity   . Other specified forms of hearing loss 05/10/2009  . Other symptoms referable to lower leg joint 11/25/2007  . SHOULDER PAIN, LEFT 08/27/2009  . Unspecified hearing loss 04/25/2008  . Vitamin D deficiency 04/09/2017    PROCEDURE: Procedure(s): INTRAMEDULLARY (IM) RETROGRADE FEMORAL NAILING on 04/08/2017  CONSULTS: Treatment Team:  Altamese [place], MD Roxan Hockey, MD   HISTORY:  See H&P in chart  HOSPITAL COURSE:  Dylan [name] is a 46 y.o. admitted on 04/07/2017 and found to have a diagnosis of left femur fracture.  After appropriate laboratory studies were obtained  they were taken to the operating room on 04/08/2017 and underwent Procedure(s): INTRAMEDULLARY (IM) RETROGRADE FEMORAL NAILING.   They were given perioperative antibiotics:  Anti-infectives (From admission, onward)    Start     Dose/Rate Route Frequency Ordered Stop   04/08/17 1500  ceFAZolin (ANCEF) IVPB 1 g/50 mL premix     1 g 100 mL/hr over 30 Minutes Intravenous Every 6 hours 04/08/17 1300 04/09/17 0351   04/08/17 0845  ceFAZolin (ANCEF) IVPB 1 g/50 mL premix  Status:  Discontinued     1 g 100 mL/hr over 30 Minutes Intravenous To Surgery 04/08/17 0830 04/08/17 1240   04/08/17 0845  ceFAZolin (ANCEF) IVPB 1 g/50 mL premix  Status:  Discontinued     1 g 100 mL/hr over 30 Minutes Intravenous To Surgery 04/08/17 0830 04/08/17 0831   04/08/17 0845  ceFAZolin (ANCEF) IVPB 2g/100 mL premix  Status:  Discontinued     2 g 200 mL/hr over 30 Minutes Intravenous To Surgery 04/08/17 0831 04/08/17 1240   04/08/17 0800  ceFAZolin (ANCEF) 3 g in dextrose 5 % 50 mL IVPB     3 g 130 mL/hr over 30 Minutes Intravenous To ShortStay Surgical 04/07/17 1730 04/08/17 0925    .  Patient given tranexamic acid IV or topical and exparel intra-operatively.  Tolerated the procedure well.    POD# 1: Vital signs were stable.  Patient denied Chest pain, shortness of breath, or calf pain.  Patient was started on Lovenox 30 mg subcutaneously twice daily at 8am.  Consults to PT, OT, and care  management were made.  The patient was weight bearing as tolerated.  CPM was placed on the operative leg 0-90 degrees for 6-8 hours a day. When out of the CPM, patient was placed in the foam block to achieve full extension. Incentive spirometry was taught.  Dressing was changed.       POD #2, Continued  PT for ambulation and exercise program.  IV saline locked.  O2 discontinued.    The remainder of the hospital course was dedicated to ambulation and strengthening.   The patient was discharged on 3 Days Post-Op in  Good condition.  Blood products given:none  DIAGNOSTIC STUDIES: Recent vital signs:  Patient Vitals for the past 24 hrs:  BP Temp Temp src Pulse Resp SpO2  04/11/17 0506 139/71 98.2 F (36.8 C) Oral 81 18 94 %  04/10/17  2153 120/80 98.2 F (36.8 C) Oral 82 18 98 %       Recent laboratory studies: Recent Labs    04/07/17 1314 04/09/17 0559 04/10/17 0641 04/11/17 0614  WBC 6.6 8.9 6.3 6.6  HGB 13.2 12.2* 11.4* 12.0*  HCT 38.1* 36.4* 33.6* 35.6*  PLT 166 187 166 180   Recent Labs    04/07/17 1314 04/07/17 1834 04/09/17 0559 04/10/17 0641 04/11/17 0614  NA 136  --  138 136 135  K 3.7  --  3.8 3.5 3.7  CL 103  --  101 99* 98*  CO2 25  --  '30 29 29  '$ BUN 10  --  '7 7 9  '$ CREATININE 0.72  --  0.75 0.70 0.67  GLUCOSE 303*  --  200* 213* 194*  CALCIUM 8.6* 8.3* 8.4* 8.2* 8.7*   Lab Results  Component Value Date   INR 1.03 04/07/2017     Recent Radiographic Studies :  Dg Tibia/fibula Left  Result Date: 04/07/2017 CLINICAL DATA:  Fall.  Left leg pain. EXAM: LEFT TIBIA AND FIBULA - 2 VIEW COMPARISON:  None. FINDINGS: Metallic foreign body noted in the lateral proximal calf soft tissues. No underlying bony abnormality. No fracture, subluxation or dislocation. Knee joint and ankle joint are maintained. No joint effusion. IMPRESSION: No acute bony abnormality. Electronically Signed   By: Rolm Baptise M.D.   On: 04/07/2017 12:26   Ct Cervical Spine Wo Contrast  Result Date: 04/07/2017 CLINICAL DATA:  Status post fall EXAM: CT CERVICAL SPINE WITHOUT CONTRAST TECHNIQUE: Multidetector CT imaging of the cervical spine was performed without intravenous contrast. Multiplanar CT image reconstructions were also generated. COMPARISON:  None. FINDINGS: Alignment: Normal. Skull base and vertebrae: No acute fracture. No primary bone lesion or focal pathologic process. Soft tissues and spinal canal: No prevertebral fluid or swelling. No visible canal hematoma. Disc levels: Disc spaces are maintained. Broad-based disc bulge at C3-4. No foraminal or central canal stenosis. Upper chest: Lung apices are clear. Other: No fluid collection or hematoma. IMPRESSION: 1.  No acute osseous injury of the cervical spine.  Electronically Signed   By: Kathreen Devoid   On: 04/07/2017 12:48   Dg Chest Port 1 View  Result Date: 04/07/2017 CLINICAL DATA:  46 year old male under preoperative evaluation. History of leg fracture. EXAM: PORTABLE CHEST 1 VIEW COMPARISON:  Chest x-ray 02/20/2008. FINDINGS: Mild linear scarring or subsegmental atelectasis in the lower left hemithorax either in the lingula or left lower lobe. Lung volumes are low. No consolidative airspace disease. No pleural effusions. No evidence pulmonary edema. Heart size is normal. Upper mediastinal contours are within normal limits. IMPRESSION: 1. Mild  linear scarring or subsegmental atelectasis in the left lower lung. No radiographic evidence of acute cardiopulmonary disease. Electronically Signed   By: Vinnie Langton M.D.   On: 04/07/2017 15:42   Dg C-arm 61-120 Min  Result Date: 04/08/2017 CLINICAL DATA:  Internal fixation EXAM: LEFT FEMUR 2 VIEWS; DG C-ARM 61-120 MIN COMPARISON:  04/07/2017 FINDINGS: Multiple intraoperative spot images demonstrate placement of intramedullary nail across the left femoral shaft fracture. Anatomic alignment. No hardware complicating feature. IMPRESSION: Internal fixation across the left femoral shaft fracture. No visible complicating feature. Electronically Signed   By: Rolm Baptise M.D.   On: 04/08/2017 11:28   Dg Femur Min 2 Views Left  Result Date: 04/08/2017 CLINICAL DATA:  Internal fixation EXAM: LEFT FEMUR 2 VIEWS; DG C-ARM 61-120 MIN COMPARISON:  04/07/2017 FINDINGS: Multiple intraoperative spot images demonstrate placement of intramedullary nail across the left femoral shaft fracture. Anatomic alignment. No hardware complicating feature. IMPRESSION: Internal fixation across the left femoral shaft fracture. No visible complicating feature. Electronically Signed   By: Rolm Baptise M.D.   On: 04/08/2017 11:28   Dg Femur Min 2 Views Left  Result Date: 04/07/2017 CLINICAL DATA:  Fall.  Left femur pain EXAM: LEFT FEMUR  2 VIEWS COMPARISON:  None. FINDINGS: There is a transverse fracture through the left femoral distal shaft. Approximately 1 shaft with lateral displacement of the distal fragments. IMPRESSION: Displaced transverse fracture through the distal left femoral shaft. Electronically Signed   By: Rolm Baptise M.D.   On: 04/07/2017 12:26   Dg Femur Port Min 2 Views Left  Result Date: 04/08/2017 CLINICAL DATA:  Retrograde intramedullary nail fixation of the femur for fracture. EXAM: LEFT FEMUR PORTABLE 2 VIEWS COMPARISON:  None. FINDINGS: A retrograde intramedullary nail is noted with 3 locking screws distally and 2 proximally spanning a fracture at the junction of the middle and distal third of the left femur. Small amount of intra-articular air from instrumentation is noted within the knee joint. Near-anatomic alignment is noted. IMPRESSION: Retrograde IM nail fixation across a left femoral diaphyseal fracture in near anatomic alignment. Postop change about the knee. Electronically Signed   By: Ashley Royalty M.D.   On: 04/08/2017 15:06    DISCHARGE INSTRUCTIONS: Discharge Instructions    Call MD / Call 911   Complete by:  As directed    If you experience chest pain or shortness of breath, CALL 911 and be transported to the hospital emergency room.  If you develope a fever above 101 F, pus (white drainage) or increased drainage or redness at the wound, or calf pain, call your surgeon's office.   Call MD / Call 911   Complete by:  As directed    If you experience chest pain or shortness of breath, CALL 911 and be transported to the hospital emergency room.  If you develope a fever above 101 F, pus (white drainage) or increased drainage or redness at the wound, or calf pain, call your surgeon's office.   Constipation Prevention   Complete by:  As directed    Drink plenty of fluids.  Prune juice may be helpful.  You may use a stool softener, such as Colace (over the counter) 100 mg twice a day.  Use MiraLax (over  the counter) for constipation as needed.   Constipation Prevention   Complete by:  As directed    Drink plenty of fluids.  Prune juice may be helpful.  You may use a stool softener, such as Colace (over the counter)  100 mg twice a day.  Use MiraLax (over the counter) for constipation as needed.   Diet - low sodium heart healthy   Complete by:  As directed    Diet Carb Modified   Complete by:  As directed    Discharge instructions   Complete by:  As directed    Orthopaedic Trauma Service Discharge Instructions   General Discharge Instructions  WEIGHT BEARING STATUS: Touchdown weightbearing left leg  RANGE OF MOTION/ACTIVITY: Unrestricted range of motion left knee and hip.  Please continue to do home exercise program that was reviewed with you 2-3 times a day.  Okay to use exercise bike  Wound Care: Daily wound care starting on arrival to home.  Please see instructions below  Discharge Wound Care Instructions  Do NOT apply any ointments, solutions or lotions to pin sites or surgical wounds.  These prevent needed drainage and even though solutions like hydrogen peroxide kill bacteria, they also damage cells lining the pin sites that help fight infection.  Applying lotions or ointments can keep the wounds moist and can cause them to breakdown and open up as well. This can increase the risk for infection. When in doubt call the office.  Surgical incisions should be dressed daily.  If any drainage is noted, use one layer of adaptic, then gauze, Kerlix, and an ace wrap.  Once the incision is completely dry and without drainage, it may be left open to air out.  Showering may begin 36-48 hours later.  Cleaning gently with soap and water.  Traumatic wounds should be dressed daily as well.    One layer of adaptic, gauze, Kerlix, then ace wrap.  The adaptic can be discontinued once the draining has ceased    If you have a wet to dry dressing: wet the gauze with saline the squeeze as much saline  out so the gauze is moist (not soaking wet), place moistened gauze over wound, then place a dry gauze over the moist one, followed by Kerlix wrap, then ace wrap.    DVT/PE prophylaxis: Lovenox 40 mg subcutaneous injection daily x 28 daily  Diet: Be more mindful with diet.  Improved blood sugar control to decrease risk of complications including infection in the bone not healing.  Please limit or eliminate foods including processed/redefined grains and high sugar foods, also avoid fried foods.  Resources include the [employer] ([address] [place]), the [employer] ([employer] Jeneen Rinks), the [employer] and [employer] Bevelyn Ngo), Unconventional Medicine and Your Personal Paleo Code Lonia Farber). These are all book titles as well as websites with an abundance of information.  These individuals also have podcasts as well. Another good resource is Dr. Sharman Cheek. He has several books and a podcast  Can use over the counter stool softeners and bowel preparations, such as Miralax, to help with bowel movements.  Narcotics can be constipating.  Be sure to drink plenty of fluids  PAIN MEDICATION USE AND EXPECTATIONS  You have likely been given narcotic medications to help control your pain.  After a traumatic event that results in an fracture (broken bone) with or without surgery, it is ok to use narcotic pain medications to help control one's pain.  We understand that everyone responds to pain differently and each individual patient will be evaluated on a regular basis for the continued need for narcotic medications. Ideally, narcotic medication use should last no more than 6-8 weeks (coinciding with fracture healing).   As a patient it is  your responsibility as well to monitor narcotic medication use and report the amount and frequency you use these medications when you come to your office visit.   We would also advise that if you are using narcotic medications, you should take a dose prior to therapy to maximize  you participation.  IF YOU ARE ON NARCOTIC MEDICATIONS IT IS NOT PERMISSIBLE TO OPERATE A MOTOR VEHICLE (MOTORCYCLE/CAR/TRUCK/MOPED) OR HEAVY MACHINERY DO NOT MIX NARCOTICS WITH OTHER CNS (CENTRAL NERVOUS SYSTEM) DEPRESSANTS SUCH AS ALCOHOL   STOP SMOKING OR USING NICOTINE PRODUCTS!!!!  As discussed nicotine severely impairs your body's ability to heal surgical and traumatic wounds but also impairs bone healing.  Wounds and bone heal by forming microscopic blood vessels (angiogenesis) and nicotine is a vasoconstrictor (essentially, shrinks blood vessels).  Therefore, if vasoconstriction occurs to these microscopic blood vessels they essentially disappear and are unable to deliver necessary nutrients to the healing tissue.  This is one modifiable factor that you can do to dramatically increase your chances of healing your injury.    (This means no smoking, no nicotine gum, patches, etc)  DO NOT USE NONSTEROIDAL ANTI-INFLAMMATORY DRUGS (NSAID'S)  Using products such as Advil (ibuprofen), Aleve (naproxen), Motrin (ibuprofen) for additional pain control during fracture healing can delay and/or prevent the healing response.  If you would like to take over the counter (OTC) medication, Tylenol (acetaminophen) is ok.  However, some narcotic medications that are given for pain control contain acetaminophen as well. Therefore, you should not exceed more than 4000 mg of tylenol in a day if you do not have liver disease.  Also note that there are may OTC medicines, such as cold medicines and allergy medicines that my contain tylenol as well.  If you have any questions about medications and/or interactions please ask your doctor/PA or your pharmacist.      ICE AND ELEVATE INJURED/OPERATIVE EXTREMITY  Using ice and elevating the injured extremity above your heart can help with swelling and pain control.  Icing in a pulsatile fashion, such as 20 minutes on and 20 minutes off, can be followed.    Do not place ice  directly on skin. Make sure there is a barrier between to skin and the ice pack.    Using frozen items such as frozen peas works well as the conform nicely to the are that needs to be iced.  USE AN ACE WRAP OR TED HOSE FOR SWELLING CONTROL  In addition to icing and elevation, Ace wraps or TED hose are used to help limit and resolve swelling.  It is recommended to use Ace wraps or TED hose until you are informed to stop.    When using Ace Wraps start the wrapping distally (farthest away from the body) and wrap proximally (closer to the body)   Example: If you had surgery on your leg or thing and you do not have a splint on, start the ace wrap at the toes and work your way up to the thigh        If you had surgery on your upper extremity and do not have a splint on, start the ace wrap at your fingers and work your way up to the upper arm  IF YOU ARE IN A SPLINT OR CAST DO NOT [place]   If your splint gets wet for any reason please contact the office immediately. You may shower in your splint or cast as long as you keep it dry.  This can  be done by wrapping in a cast cover or garbage back (or similar)  Do Not stick any thing down your splint or cast such as pencils, money, or hangers to try and scratch yourself with.  If you feel itchy take benadryl as prescribed on the bottle for itching  IF YOU ARE IN A CAM BOOT (BLACK BOOT)  You may remove boot periodically. Perform daily dressing changes as noted below.  Wash the liner of the boot regularly and wear a sock when wearing the boot. It is recommended that you sleep in the boot until told otherwise  CALL THE OFFICE WITH ANY QUESTIONS OR CONCERNS: [phone]   Discharge instructions   Complete by:  As directed    It is VERY IMPORTANT that you follow up with a PCP on a regular basis.  Check your blood glucoses before breakfast and bedtime and maintain a log of your readings.  Bring this log with you when you follow up with your PCP so  that he can adjust your diabetes medications at your follow up visit.   Discharge instructions   Complete by:  As directed    Keep wound clean and dry. Dressing changes as needed. Touch down weight bearing. Follow up with Dr Marcelino Scot.   Driving restrictions   Complete by:  As directed    No driving until off all pain meds   Increase activity slowly as tolerated   Complete by:  As directed    Increase activity slowly as tolerated   Complete by:  As directed    Touch down weight bearing   Complete by:  As directed    Laterality:  left   Extremity:  Lower      DISCHARGE MEDICATIONS:   Allergies as of 04/11/2017      Reactions   Penicillins Other (See Comments)   From childhood: Has patient had a PCN reaction causing immediate rash, facial/tongue/throat swelling, SOB or lightheadedness with hypotension: Unk Has patient had a PCN reaction causing severe rash involving mucus membranes or skin necrosis: Unk Has patient had a PCN reaction that required hospitalization: Unk Has patient had a PCN reaction occurring within the last 10 years: No If all of the above answers are "NO", then may proceed with Cephalosporin use.      Medication List    STOP taking these medications   naproxen 500 MG tablet Commonly known as:  NAPROSYN     TAKE these medications   ascorbic acid 500 MG tablet Commonly known as:  VITAMIN C Take 1 tablet (500 mg total) by mouth daily.   aspirin 81 MG EC tablet Take 1 tablet (81 mg total) by mouth daily. Annual appt w/labs due in Sept must see MD for refills What changed:  additional instructions   calcium carbonate 1250 (500 Ca) MG tablet Commonly known as:  OS-CAL - dosed in mg of elemental calcium Take 1 tablet (500 mg of elemental calcium total) by mouth 2 (two) times daily with a meal.   Cholecalciferol 5000 units Tabs Take 0.4 tablets (2,000 Units total) by mouth daily.   docusate sodium 100 MG capsule Commonly known as:  COLACE Take 1 capsule (100  mg total) by mouth 2 (two) times daily.   enoxaparin 40 MG/0.4ML injection Commonly known as:  LOVENOX Inject 0.4 mLs (40 mg total) into the skin daily for 28 days.   glipiZIDE 5 MG 24 hr tablet Commonly known as:  GLUCOTROL XL Take 1 tablet (5 mg total) by mouth  daily with breakfast. TAKE 1 TABLET (5 MG TOTAL) BY MOUTH DAILY. What changed:    how much to take  how to take this  when to take this   glucose blood test strip Lancet strips Use as instructed E11.0 What changed:  additional instructions   Lancets Misc. Misc Use as directed once daily   losartan 25 MG tablet Commonly known as:  COZAAR TAKE 1 TABLET BY MOUTH EVERY DAY What changed:    how much to take  how to take this  when to take this  Another medication with the same name was removed. Continue taking this medication, and follow the directions you see here.   metFORMIN 500 MG 24 hr tablet Commonly known as:  GLUCOPHAGE-XR Take 1 tablet (500 mg total) by mouth 2 (two) times daily. What changed:  when to take this   methocarbamol 750 MG tablet Commonly known as:  ROBAXIN Take 1 tablet (750 mg total) by mouth every 6 (six) hours as needed for muscle spasms.   oxyCODONE 5 MG immediate release tablet Commonly known as:  Oxy IR/ROXICODONE Take 1-2 tablets (5-10 mg total) by mouth every 8 (eight) hours as needed for breakthrough pain (take between percocet for breakthrough pain).   oxyCODONE-acetaminophen 7.5-325 MG tablet Commonly known as:  PERCOCET Take 1-2 tablets by mouth every 8 (eight) hours as needed for moderate pain or severe pain.   pioglitazone 30 MG tablet Commonly known as:  ACTOS Take 1 tablet (30 mg total) by mouth daily.   rosuvastatin 40 MG tablet Commonly known as:  CRESTOR TAKE 1 TABLET (40 MG TOTAL) BY MOUTH DAILY.     ASK your doctor about these medications   enoxaparin Kit Commonly known as:  LOVENOX 1 kit by Does not apply route once for 1 dose. Ask about: Should I take  this medication?            Discharge Care Instructions  (From admission, onward)        Start     Ordered   04/09/17 0000  Touch down weight bearing    Question Answer Comment  Laterality left   Extremity Lower      04/09/17 1006      FOLLOW UP VISIT:    Contact information for follow-up providers    Altamese [place], MD. Schedule an appointment as soon as possible for a visit in 3 week(s).   Specialty:  Orthopedic Surgery Contact information: [place] [address] [phone]        Biagio Borg, MD. Schedule an appointment as soon as possible for a visit in 2 week(s).   Specialties:  Internal Medicine, Radiology Why:  review diabetes medications  Contact information: [place]  [address] [phone]            Contact information for after-discharge care    [facility] .   Service:  Skilled Nursing Contact information: [address] [place] [phone]                  DISPOSITION: HOME VS. SNF  CONDITION:  Keenan Bachelor 04/11/2017, 11:06 AM

## 2017-04-11 NOTE — Progress Notes (Signed)
[facility]

## 2017-04-11 NOTE — Progress Notes (Signed)
SPORTS MEDICINE AND JOINT REPLACEMENT  Dylan SpurlingStephen Lucey, MD    Dylan Nancyolby Jewelia Bocchino, PA-C [address], [place], [place]  [id number]                             [phone]   PROGRESS NOTE  Subjective:  negative for Chest Pain  negative for Shortness of Breath  negative for Nausea/Vomiting   negative for Calf Pain  negative for Bowel Movement   Tolerating Diet: yes         Patient reports pain as 4 on 0-10 scale.    Objective: Vital signs in last 24 hours:    Patient Vitals for the past 24 hrs:  BP Temp Temp src Pulse Resp SpO2  04/11/17 0506 139/71 98.2 F (36.8 C) Oral 81 18 94 %  04/10/17 2153 120/80 98.2 F (36.8 C) Oral 82 18 98 %    @flow {1959:LAST@   Intake/Output from previous day:   12/15 0701 - 12/16 0700 In: 1535 [P.O.:480; I.V.:1055] Out: 2300 [Urine:2300]   Intake/Output this shift:   No intake/output data recorded.   Intake/Output      12/15 0701 - 12/16 0700 12/16 0701 - 12/17 0700   P.O. 480    I.V. (mL/kg) 1055 (8)    IV Piggyback     Total Intake(mL/kg) 1535 (11.7)    Urine (mL/kg/hr) 2300 (0.7)    Total Output 2300    Net -765            LABORATORY DATA: Recent Labs    04/07/17 1314 04/09/17 0559 04/10/17 0641 04/11/17 0614  WBC 6.6 8.9 6.3 6.6  HGB 13.2 12.2* 11.4* 12.0*  HCT 38.1* 36.4* 33.6* 35.6*  PLT 166 187 166 180   Recent Labs    04/07/17 1314 04/07/17 1834 04/09/17 0559 04/10/17 0641 04/11/17 0614  NA 136  --  138 136 135  K 3.7  --  3.8 3.5 3.7  CL 103  --  101 99* 98*  CO2 25  --  30 29 29   BUN 10  --  7 7 9   CREATININE 0.72  --  0.75 0.70 0.67  GLUCOSE 303*  --  200* 213* 194*  CALCIUM 8.6* 8.3* 8.4* 8.2* 8.7*   Lab Results  Component Value Date   INR 1.03 04/07/2017    Examination:  General appearance: alert, cooperative and no distress Extremities: extremities normal, atraumatic, no cyanosis or edema  Wound Exam: clean, dry, intact   Drainage:  None: wound tissue dry  Motor Exam: Quadriceps and  Hamstrings Intact  Sensory Exam: Superficial Peroneal, Deep Peroneal and Tibial normal   Assessment:    3 Days Post-Op  Procedure(s) (LRB): INTRAMEDULLARY (IM) RETROGRADE FEMORAL NAILING (Left)  ADDITIONAL DIAGNOSIS:  Principal Problem:   Femur fracture, left (HCC) Active Problems:   Hyperlipidemia   Essential hypertension   Asthma   Diabetes (HCC)   Vitamin D deficiency   Metabolic syndrome     Plan: Physical Therapy as ordered Touch Down Weight Bearing (TDWB)  DVT Prophylaxis:  Lovenox  DISCHARGE PLAN: Skilled Nursing Facility/Rehab  DISCHARGE NEEDS: HHPT   Patient doing better but still limited by pain.  Ready for D/C to SNF once bed available. Patient and family are aware.          Dylan [name] 04/11/2017, 9:16 AM

## 2017-04-12 ENCOUNTER — Encounter (HOSPITAL_COMMUNITY): Payer: Self-pay | Admitting: Orthopedic Surgery

## 2017-04-14 LAB — TESTOSTERONE, % FREE: Testosterone-% Free: 1.3 % — ABNORMAL HIGH (ref 0.2–0.7)

## 2017-05-03 ENCOUNTER — Other Ambulatory Visit: Payer: Self-pay

## 2017-05-03 ENCOUNTER — Other Ambulatory Visit: Payer: Self-pay | Admitting: Internal Medicine

## 2017-05-03 MED ORDER — GLUCOSE BLOOD VI STRP: ORAL_STRIP | 11 refills | Status: DC

## 2017-05-03 MED ORDER — GLUCOSE BLOOD VI STRP: ORAL_STRIP | 5 refills | Status: DC

## 2017-05-03 NOTE — Addendum Note (Signed)
Addended by: Roney MansGAY, Colin Ellers on: 05/03/2017 10:01 AM   Modules accepted: Orders

## 2017-05-03 NOTE — Addendum Note (Signed)
Addended by: Corwin LevinsJOHN, [name] on: 05/03/2017 12:25 PM   Modules accepted: Orders

## 2017-05-03 NOTE — Telephone Encounter (Signed)
Unfortunately, I am not sure how to prescribe this  Please clarify the request

## 2017-05-03 NOTE — Telephone Encounter (Signed)
How often do you want her to test?

## 2017-05-18 ENCOUNTER — Inpatient Hospital Stay: Payer: 59 | Admitting: Internal Medicine

## 2017-05-24 ENCOUNTER — Other Ambulatory Visit: Payer: 59

## 2017-05-24 ENCOUNTER — Ambulatory Visit (INDEPENDENT_AMBULATORY_CARE_PROVIDER_SITE_OTHER): Payer: Managed Care, Other (non HMO) | Admitting: Internal Medicine

## 2017-05-24 ENCOUNTER — Encounter: Payer: Self-pay | Admitting: Internal Medicine

## 2017-05-24 VITALS — BP 138/88 | HR 97 | Temp 98.0°F | Ht 72.0 in | Wt 285.0 lb

## 2017-05-24 DIAGNOSIS — I10 Essential (primary) hypertension: Secondary | ICD-10-CM

## 2017-05-24 DIAGNOSIS — E559 Vitamin D deficiency, unspecified: Secondary | ICD-10-CM | POA: Diagnosis not present

## 2017-05-24 DIAGNOSIS — R269 Unspecified abnormalities of gait and mobility: Secondary | ICD-10-CM

## 2017-05-24 DIAGNOSIS — E119 Type 2 diabetes mellitus without complications: Secondary | ICD-10-CM

## 2017-05-24 DIAGNOSIS — S7292XA Unspecified fracture of left femur, initial encounter for closed fracture: Secondary | ICD-10-CM | POA: Diagnosis not present

## 2017-05-24 MED ORDER — VITAMIN D (ERGOCALCIFEROL) 1.25 MG (50000 UNIT) PO CAPS: 50000.0000 [IU] | ORAL_CAPSULE | ORAL | 0 refills | Status: DC

## 2017-05-24 MED ORDER — METFORMIN HCL ER 500 MG PO TB24: 1000.0000 mg | ORAL_TABLET | Freq: Two times a day (BID) | ORAL | 3 refills | Status: DC

## 2017-05-24 MED ORDER — METFORMIN HCL ER 500 MG PO TB24: 1000.0000 mg | ORAL_TABLET | Freq: Every day | ORAL | 3 refills | Status: DC

## 2017-05-24 NOTE — Patient Instructions (Signed)
Please take all new medication as prescribed - the Vitamin D 1610950000 units ONCE per week, then go back to the Vit D you are taking now every day  Please continue all other medications as before, and refills have been done if requested.  Please have the pharmacy call with any other refills you may need.  Please continue your efforts at being more active, low cholesterol diabetic diet, and weight control.  You will be contacted regarding the referral for: Physical Therapy  Please keep your appointments with your specialists as you may have planned - orthopedic  Please schedule the bone density test before leaving today at the scheduling desk (where you check out)  You will be contacted by phone if any changes need to be made immediately.  Otherwise, you will receive a letter about your results with an explanation, but please check with MyChart first.  Please remember to sign up for MyChart if you have not done so, as this will be important to you in the future with finding out test results, communicating by private email, and scheduling acute appointments online when needed.  OK to cancel the Mar 22 appt  Please return in 3 months, or sooner if needed, with Lab testing done 3-5 days before

## 2017-05-24 NOTE — Assessment & Plan Note (Signed)
Much improved with more strict attention to med compliance and diet since recent surgury; cont same tx, for f/u lab next visit

## 2017-05-24 NOTE — Progress Notes (Signed)
Subjective:    Patient ID: Dylan [name], male    DOB: Sep 14, 1970, 47 y.o.   MRN: [id number]  HPI  Here to f/u; overall doing ok,  Pt denies chest pain, increasing sob or doe, wheezing, orthopnea, PND, increased LE swelling, palpitations, dizziness or syncope.  Pt denies new neurological symptoms such as new headache, or facial or extremity weakness or numbness.  Pt denies polydipsia, polyuria, or low sugar episode.  Pt states overall good compliance with meds, mostly trying to follow appropriate diet much better than before and states his med compliance is now much improved as this episode jolted him.  cbg's about 130 at home on average.  Not getting PT for left leg currently due to insurance issue and in and out of network providers  . Noted to have Very Low Vit D dec 2018, PTH normal.  Has not had prior DXA.  Finished 28 day lovenox without complication after slip and fall on ice with left femur fx dec 2018 requiring intramedullary nailing.   Pt denies fever, wt loss, night sweats, loss of appetite, or other constitutional symptoms No other new complaints Past Medical History:  Diagnosis Date  . ALLERGIC RHINITIS 11/25/2007  . ASTHMA 11/25/2007  . Asthma   . CARPAL TUNNEL SYNDROME, LEFT, MILD 11/25/2007  . DIABETES MELLITUS, TYPE II 11/25/2007  . Femur fracture, left (HCC) 04/07/2017  . HYPERLIPIDEMIA 11/25/2007  . Metabolic syndrome 04/09/2017  . NECK PAIN 08/27/2009  . Obesity   . Other specified forms of hearing loss 05/10/2009  . Other symptoms referable to lower leg joint 11/25/2007  . SHOULDER PAIN, LEFT 08/27/2009  . Unspecified hearing loss 04/25/2008  . Vitamin D deficiency 04/09/2017   Past Surgical History:  Procedure Laterality Date  . BACK SURGERY    . FEMUR IM NAIL Left 04/08/2017   Procedure: INTRAMEDULLARY (IM) RETROGRADE FEMORAL NAILING;  Surgeon: Myrene Galas, MD;  Location: [facility] OR;  Service: Orthopedics;  Laterality: Left;  . s/p back surgury  2002   Lumbar disc    reports that  has never smoked. he has never used smokeless tobacco. He reports that he does not drink alcohol or use drugs. family history includes Diabetes in his other. Allergies  Allergen Reactions  . Penicillins Other (See Comments)    From childhood: Has patient had a PCN reaction causing immediate rash, facial/tongue/throat swelling, SOB or lightheadedness with hypotension: Unk Has patient had a PCN reaction causing severe rash involving mucus membranes or skin necrosis: Unk Has patient had a PCN reaction that required hospitalization: Unk Has patient had a PCN reaction occurring within the last 10 years: No If all of the above answers are "NO", then may proceed with Cephalosporin use.    Current Outpatient Medications on File Prior to Visit  Medication Sig Dispense Refill  . aspirin 81 MG EC tablet Take 1 tablet (81 mg total) by mouth daily. Annual appt w/labs due in Sept must see MD for refills (Patient taking differently: Take 81 mg by mouth daily. ) 90 tablet 0  . calcium carbonate (OS-CAL - DOSED IN MG OF ELEMENTAL CALCIUM) 1250 (500 Ca) MG tablet Take 1 tablet (500 mg of elemental calcium total) by mouth 2 (two) times daily with a meal. 60 tablet 2  . cholecalciferol 5000 units TABS Take 0.4 tablets (2,000 Units total) by mouth daily. 30 tablet 2  . docusate sodium (COLACE) 100 MG capsule Take 1 capsule (100 mg total) by mouth 2 (two) times daily. 30 capsule  0  . glipiZIDE (GLUCOTROL XL) 5 MG 24 hr tablet Take 1 tablet (5 mg total) by mouth daily with breakfast. TAKE 1 TABLET (5 MG TOTAL) BY MOUTH DAILY. 30 tablet 1  . glucose blood test strip Lancet strips Use as instructed once daily E11.0 100 each 11  . Lancets Misc. MISC Use as directed once daily 100 each 11  . losartan (COZAAR) 25 MG tablet TAKE 1 TABLET BY MOUTH EVERY DAY (Patient taking differently: Take 25 mg by mouth once a day) 90 tablet 3  . methocarbamol (ROBAXIN) 750 MG tablet Take 1 tablet (750 mg total) by mouth  every 6 (six) hours as needed for muscle spasms. 60 tablet 1  . oxyCODONE (OXY IR/ROXICODONE) 5 MG immediate release tablet Take 1-2 tablets (5-10 mg total) by mouth every 8 (eight) hours as needed for breakthrough pain (take between percocet for breakthrough pain). 30 tablet 0  . oxyCODONE-acetaminophen (PERCOCET) 7.5-325 MG tablet Take 1-2 tablets by mouth every 8 (eight) hours as needed for moderate pain or severe pain. 56 tablet 0  . pioglitazone (ACTOS) 30 MG tablet Take 1 tablet (30 mg total) by mouth daily. 90 tablet 3  . rosuvastatin (CRESTOR) 40 MG tablet TAKE 1 TABLET (40 MG TOTAL) BY MOUTH DAILY. 90 tablet 3  . vitamin C (VITAMIN C) 500 MG tablet Take 1 tablet (500 mg total) by mouth daily. 30 tablet 2   No current facility-administered medications on file prior to visit.    Review of Systems  Constitutional: Negative for other unusual diaphoresis or sweats HENT: Negative for ear discharge or swelling Eyes: Negative for other worsening visual disturbances Respiratory: Negative for stridor or other swelling  Gastrointestinal: Negative for worsening distension or other blood Genitourinary: Negative for retention or other urinary change Musculoskeletal: Negative for other MSK pain or swelling Skin: Negative for color change or other new lesions Neurological: Negative for worsening tremors and other numbness  Psychiatric/Behavioral: Negative for worsening agitation or other fatigue All other system neg per pt    Objective:   Physical Exam BP 138/88   Pulse 97   Temp 98 F (36.7 C) (Oral)   Ht 6' (1.829 m)   Wt 285 lb (129.3 kg)   SpO2 96%   BMI 38.65 kg/m  VS noted,  Walks with walker Constitutional: Pt appears in NAD HENT: Head: NCAT.  Right Ear: External ear normal.  Left Ear: External ear normal.  Eyes: . Pupils are equal, round, and reactive to light. Conjunctivae and EOM are normal Nose: without d/c or deformity Neck: Neck supple. Gross normal ROM Cardiovascular:  Normal rate and regular rhythm.   Pulmonary/Chest: Effort normal and breath sounds without rales or wheezing.  Abd:  Soft, NT, ND, + BS, no organomegaly Neurological: Pt is alert. At baseline orientation, motor grossly intact Skin: Skin is warm. No rashes, other new lesions, no LE edema Psychiatric: Pt behavior is normal without agitation  No other exam findings    Assessment & Plan:

## 2017-05-24 NOTE — Assessment & Plan Note (Addendum)
Improved, will try to assist with referral to in network PT, also for DXA r/o osteoporosis  Note:  Total time for pt hx, exam, review of record with pt in the room, determination of diagnoses and plan for further eval and tx is > 40 min, with over 50% spent in coordination and counseling of patient including the differential dx, tx, further evaluation and other management of left femur fx, DM, HTN, and severe Vit D deficiency

## 2017-05-24 NOTE — Assessment & Plan Note (Signed)
Stable, cont low dose losartan 25 qd

## 2017-05-24 NOTE — Assessment & Plan Note (Signed)
Severe, ok for vit D 50K units wkly for 12 wks, then restart current Vit D supplement indefinitely

## 2017-06-03 ENCOUNTER — Ambulatory Visit: Payer: Managed Care, Other (non HMO)

## 2017-06-20 ENCOUNTER — Encounter (HOSPITAL_COMMUNITY): Payer: Self-pay | Admitting: *Deleted

## 2017-06-20 ENCOUNTER — Other Ambulatory Visit: Payer: Self-pay

## 2017-06-20 ENCOUNTER — Emergency Department (HOSPITAL_COMMUNITY)
Admission: EM | Admit: 2017-06-20 | Discharge: 2017-06-20 | Disposition: A | Discharge status: Home / Self Care | Payer: Managed Care, Other (non HMO) | Attending: Emergency Medicine | Admitting: Emergency Medicine

## 2017-06-20 DIAGNOSIS — R202 Paresthesia of skin: Secondary | ICD-10-CM | POA: Diagnosis not present

## 2017-06-20 DIAGNOSIS — Z79899 Other long term (current) drug therapy: Secondary | ICD-10-CM | POA: Insufficient documentation

## 2017-06-20 DIAGNOSIS — I10 Essential (primary) hypertension: Secondary | ICD-10-CM | POA: Diagnosis not present

## 2017-06-20 DIAGNOSIS — M5412 Radiculopathy, cervical region: Secondary | ICD-10-CM | POA: Diagnosis not present

## 2017-06-20 DIAGNOSIS — Z7984 Long term (current) use of oral hypoglycemic drugs: Secondary | ICD-10-CM | POA: Insufficient documentation

## 2017-06-20 DIAGNOSIS — Z7982 Long term (current) use of aspirin: Secondary | ICD-10-CM | POA: Insufficient documentation

## 2017-06-20 DIAGNOSIS — M5020 Other cervical disc displacement, unspecified cervical region: Secondary | ICD-10-CM | POA: Diagnosis not present

## 2017-06-20 DIAGNOSIS — M542 Cervicalgia: Secondary | ICD-10-CM | POA: Diagnosis present

## 2017-06-20 DIAGNOSIS — E119 Type 2 diabetes mellitus without complications: Secondary | ICD-10-CM | POA: Diagnosis not present

## 2017-06-20 DIAGNOSIS — M5030 Other cervical disc degeneration, unspecified cervical region: Secondary | ICD-10-CM

## 2017-06-20 DIAGNOSIS — J45909 Unspecified asthma, uncomplicated: Secondary | ICD-10-CM | POA: Insufficient documentation

## 2017-06-20 MED ORDER — IBUPROFEN 800 MG PO TABS: 800.0000 mg | ORAL_TABLET | Freq: Three times a day (TID) | ORAL | 0 refills | Status: DC

## 2017-06-20 MED ORDER — ACETAMINOPHEN 500 MG PO TABS: 500.0000 mg | ORAL_TABLET | Freq: Four times a day (QID) | ORAL | 0 refills | Status: DC | PRN

## 2017-06-20 MED ORDER — PREDNISONE 10 MG PO TABS: 40.0000 mg | ORAL_TABLET | Freq: Every day | ORAL | 0 refills | Status: AC

## 2017-06-20 MED ORDER — OXYCODONE HCL 5 MG PO TABS: 5.0000 mg | ORAL_TABLET | Freq: Four times a day (QID) | ORAL | 0 refills | Status: AC | PRN

## 2017-06-20 NOTE — Discharge Instructions (Signed)
I suspect your symptoms are from nerve inflammation or "radiculopathy".   For inflammation and pain take 1000 mg tylenol plus 800 mg ibuprofen every 6-8 hours. For more severe, break through pain, take 5mg  oxycodone every 6 hours. Prednisone will help with further inflammation. You can purchase over the counter lidocaine patches (salonpas) for further pain control.   Follow up with [facility] next week for further evaluation. They may suggest MRI or other treatment methods.   Return if you develop weakness or numbness, rash, fevers, neck stiffness.

## 2017-06-20 NOTE — ED Triage Notes (Signed)
Pt has been having pain to L side of neck and shoulder for 2 weeks. [facility] given Robaxin and anti-inflammatory without relief. Pt denies direct injury. Did break his leg in December and is still recovering; had been using a walker to get around. Pt has a disc from orthopedic office with xrays.

## 2017-06-20 NOTE — ED Provider Notes (Signed)
[facility] EMERGENCY DEPARTMENT Provider Note   CSN: [id number] Arrival date & time: 06/20/17  4098     History   Chief Complaint Chief Complaint  Patient presents with  . Muscle Pain    HPI Dylan [name] is a 47 y.o. male with history of diabetes is here for evaluation of left-sided neck pain that radiates to his left upper extremity to his fingertips for the last 2 weeks. Initially thought he had slept wrong but pain became worse and he went to orthopedic urgent care on 2/19 2019 where they did a cervical spine x-ray and told him he had a cervical sprain. Was prescribed ibuprofen and Robaxin that he's been taken since without relief. Pain is worse with left neck rotation. No alleviating factors. Describes pain that radiates to his fingertips as "tingling" This is present to all his fingers and to the posterior aspect of his left arm. Denies rashes, weakness, heaviness or numbness. Reports having a bad fall December 2018 where he broke his left femur but no recent trauma, falls, changes in activity, exertion or changes in sleeping patterns. No fevers.  HPI  Past Medical History:  Diagnosis Date  . ALLERGIC RHINITIS 11/25/2007  . ASTHMA 11/25/2007  . Asthma   . CARPAL TUNNEL SYNDROME, LEFT, MILD 11/25/2007  . DIABETES MELLITUS, TYPE II 11/25/2007  . Femur fracture, left (HCC) 04/07/2017  . HYPERLIPIDEMIA 11/25/2007  . Metabolic syndrome 04/09/2017  . NECK PAIN 08/27/2009  . Obesity   . Other specified forms of hearing loss 05/10/2009  . Other symptoms referable to lower leg joint 11/25/2007  . SHOULDER PAIN, LEFT 08/27/2009  . Unspecified hearing loss 04/25/2008  . Vitamin D deficiency 04/09/2017    Patient Active Problem List   Diagnosis Date Noted  . Vitamin D deficiency 04/09/2017  . Metabolic syndrome 04/09/2017  . Femur fracture, left (HCC) 04/07/2017  . Obesity   . Dysuria 01/15/2017  . Mood altered 07/10/2016  . Left facial swelling 01/10/2016  .  Bilateral knee pain 07/05/2015  . Psychosis (HCC) 02/07/2013  . Diabetes (HCC) 02/07/2013  . Hearing loss, left 02/07/2013  . Bilateral hearing loss 07/21/2012  . Preventative health care 12/23/2010  . SHOULDER PAIN, LEFT 08/27/2009  . NECK PAIN 08/27/2009  . Other specified forms of hearing loss 05/10/2009  . Essential hypertension 10/25/2008  . Hyperlipidemia 11/25/2007  . CARPAL TUNNEL SYNDROME, LEFT, MILD 11/25/2007  . ALLERGIC RHINITIS 11/25/2007  . Asthma 11/25/2007  . Other symptoms referable to lower leg joint 11/25/2007    Past Surgical History:  Procedure Laterality Date  . BACK SURGERY    . FEMUR IM NAIL Left 04/08/2017   Procedure: INTRAMEDULLARY (IM) RETROGRADE FEMORAL NAILING;  Surgeon: Myrene Galas, MD;  Location: [facility] OR;  Service: Orthopedics;  Laterality: Left;  . s/p back surgury  2002   Lumbar disc       Home Medications    Prior to Admission medications   Medication Sig Start Date End Date Taking? Authorizing Provider  acetaminophen (TYLENOL) 500 MG tablet Take 1 tablet (500 mg total) by mouth every 6 (six) hours as needed. 06/20/17   Liberty Handy, PA-C  aspirin 81 MG EC tablet Take 1 tablet (81 mg total) by mouth daily. Annual appt w/labs due in Sept must see MD for refills Patient taking differently: Take 81 mg by mouth daily.  11/09/16   Corwin Levins, MD  calcium carbonate (OS-CAL - DOSED IN MG OF ELEMENTAL CALCIUM) 1250 (500 Ca)  MG tablet Take 1 tablet (500 mg of elemental calcium total) by mouth 2 (two) times daily with a meal. 04/09/17   Montez Morita, PA-C  cholecalciferol 5000 units TABS Take 0.4 tablets (2,000 Units total) by mouth daily. 04/09/17   Montez Morita, PA-C  docusate sodium (COLACE) 100 MG capsule Take 1 capsule (100 mg total) by mouth 2 (two) times daily. 04/09/17   Montez Morita, PA-C  glipiZIDE (GLUCOTROL XL) 5 MG 24 hr tablet Take 1 tablet (5 mg total) by mouth daily with breakfast. TAKE 1 TABLET (5 MG TOTAL) BY MOUTH DAILY. 04/10/17    Rai, Delene Ruffini, MD  glucose blood test strip Lancet strips Use as instructed once daily E11.0 05/03/17   Corwin Levins, MD  ibuprofen (ADVIL,MOTRIN) 800 MG tablet Take 1 tablet (800 mg total) by mouth 3 (three) times daily. 06/20/17   Liberty Handy, PA-C  Lancets Misc. MISC Use as directed once daily 07/10/16   Corwin Levins, MD  losartan (COZAAR) 25 MG tablet TAKE 1 TABLET BY MOUTH EVERY DAY Patient taking differently: Take 25 mg by mouth once a day 02/03/17   Corwin Levins, MD  metFORMIN (GLUCOPHAGE-XR) 500 MG 24 hr tablet Take 2 tablets (1,000 mg total) by mouth daily with breakfast. 05/24/17   Corwin Levins, MD  methocarbamol (ROBAXIN) 750 MG tablet Take 1 tablet (750 mg total) by mouth every 6 (six) hours as needed for muscle spasms. 04/09/17   Montez Morita, PA-C  oxyCODONE (ROXICODONE) 5 MG immediate release tablet Take 1 tablet (5 mg total) by mouth every 6 (six) hours as needed for up to 3 days for severe pain. 06/20/17 06/23/17  Liberty Handy, PA-C  oxyCODONE-acetaminophen (PERCOCET) 7.5-325 MG tablet Take 1-2 tablets by mouth every 8 (eight) hours as needed for moderate pain or severe pain. 04/09/17 04/09/18  Montez Morita, PA-C  pioglitazone (ACTOS) 30 MG tablet Take 1 tablet (30 mg total) by mouth daily. 07/10/16   Corwin Levins, MD  predniSONE (DELTASONE) 10 MG tablet Take 4 tablets (40 mg total) by mouth daily for 3 days. 06/20/17 06/23/17  Liberty Handy, PA-C  rosuvastatin (CRESTOR) 40 MG tablet TAKE 1 TABLET (40 MG TOTAL) BY MOUTH DAILY. 02/03/17   Corwin Levins, MD  vitamin C (VITAMIN C) 500 MG tablet Take 1 tablet (500 mg total) by mouth daily. 04/10/17   Montez Morita, PA-C  Vitamin D, Ergocalciferol, (DRISDOL) 50000 units CAPS capsule Take 1 capsule (50,000 Units total) by mouth every 7 (seven) days. 05/24/17   Corwin Levins, MD    Family History Family History  Problem Relation Age of Onset  . Diabetes Other     Social History Social History   Tobacco Use  . Smoking  status: Never Smoker  . Smokeless tobacco: Never Used  Substance Use Topics  . Alcohol use: No  . Drug use: No     Allergies   Penicillins   Review of Systems Review of Systems  Musculoskeletal: Positive for neck pain.  Neurological:       +paresthesias to LUE   All other systems reviewed and are negative.    Physical Exam Updated Vital Signs BP (!) 137/100   Pulse 93   Temp 98.2 F (36.8 C) (Oral)   Resp 18   SpO2 97%   Physical Exam  Constitutional: He is oriented to person, place, and time. He appears well-developed and well-nourished. No distress.  NAD.  HENT:  Head: Normocephalic and atraumatic.  Right Ear: External ear normal.  Left Ear: External ear normal.  Nose: Nose normal.  Eyes: Conjunctivae and EOM are normal. No scleral icterus.  Neck: Normal range of motion. Neck supple. Muscular tenderness present.  Mild tenderness to left cervical musculature and trapezius without increased tone. No midline spinous process tenderness or step-offs. Full active range of motion of the neck without rigidity or meningeal signs. Patient reports mild pain with left lateral neck bend and rotation.  Cardiovascular: Normal rate, regular rhythm, normal heart sounds and intact distal pulses.  No murmur heard. Pulmonary/Chest: Effort normal and breath sounds normal. He has no wheezes.  Musculoskeletal: Normal range of motion. He exhibits no deformity.  Nontender shoulder, elbow, wrist. Full AROM of left shoulder. No obvious skin abnormalities including abrasions, ecchymosis, erythema, edema, rash to left upper extremity or chest. Positive Spurling's test. No change with shoulder abduction relief test.   Neurological: He is alert and oriented to person, place, and time.  Sensation to light touch in median, ulnar, radial nerve distribution intact bilaterally 5/5 strength with hand grip and finger abduction bilaterally  Brachioradialis DTR symmetric bilaterally  Skin: Skin is warm  and dry. Capillary refill takes less than 2 seconds.  Psychiatric: He has a normal mood and affect. His behavior is normal. Judgment and thought content normal.  Nursing note and vitals reviewed.    ED Treatments / Results  Labs (all labs ordered are listed, but only abnormal results are displayed) Labs Reviewed - No data to display  EKG  EKG Interpretation None       Radiology No results found.  Procedures Procedures (including critical care time)  Medications Ordered in ED Medications - No data to display   Initial Impression / Assessment and Plan / ED Course  I have reviewed the triage vital signs and the nursing notes.  Pertinent labs & imaging results that were available during my care of the patient were reviewed by me and considered in my medical decision making (see chart for details).    47 y.o. yo male with unilateral neck pain with paresthesias down to x2 weeks. Seen at ortho urgent care where he had normal cervical spine x-rays and diagnosed with sprain. He has f/u with [place] [facility] in 4 days but here for pain despite ibuprofen and robaxin.  VS reassuring.  There no spinous process tenderness.  Spurling maneuver reproduce paresthesias to LUE.  There is no focal weakness to LUE.  No Lhermitte's phenomenon, no gait disturbances, sensory loss, weakness, muscle atrophy.  No recent fevers, chills, unexplained weight loss, immunosuppression, cancer or IVDU.  ED imaging not indicated at this time as patient has no red flag symptoms or signs and normal c-spine xays on 2/19.  Last CT cervical spine two months ago show mild bulging of C3-4 which could explain symptoms.  Will treat for cervical radiculopathy with oral analgesics, prednisone and muscle relaxers.  He has h/o diabetes so will do short course of prednisone, advised to check CBGs at home. Advised patient to avoid aggravating activities until symptoms start to improve.  Educated patient on red flag symptoms that would  warrant return to ED, patient verbalized understanding.    Final Clinical Impressions(s) / ED Diagnoses   Final diagnoses:  Cervical radiculopathy  Bulging of cervical intervertebral disc    ED Discharge Orders        Ordered    predniSONE (DELTASONE) 10 MG tablet  Daily     06/20/17 0801    oxyCODONE (ROXICODONE)  5 MG immediate release tablet  Every 6 hours PRN     06/20/17 0801    ibuprofen (ADVIL,MOTRIN) 800 MG tablet  3 times daily     06/20/17 0801    acetaminophen (TYLENOL) 500 MG tablet  Every 6 hours PRN     06/20/17 0801       Liberty HandyGibbons, Margaurite Salido J, PA-C 06/20/17 0820    Cathren LaineSteinl, Kevin, MD 06/20/17 1329

## 2017-07-13 ENCOUNTER — Other Ambulatory Visit: Payer: Self-pay | Admitting: Internal Medicine

## 2017-07-13 DIAGNOSIS — M5030 Other cervical disc degeneration, unspecified cervical region: Secondary | ICD-10-CM | POA: Insufficient documentation

## 2017-07-16 ENCOUNTER — Ambulatory Visit: Payer: 59 | Admitting: Internal Medicine

## 2017-07-28 DIAGNOSIS — M5412 Radiculopathy, cervical region: Secondary | ICD-10-CM | POA: Insufficient documentation

## 2017-08-11 ENCOUNTER — Ambulatory Visit (INDEPENDENT_AMBULATORY_CARE_PROVIDER_SITE_OTHER)
Admission: RE | Admit: 2017-08-11 | Discharge: 2017-08-11 | Disposition: A | Discharge status: Home / Self Care | Payer: Managed Care, Other (non HMO) | Source: Ambulatory Visit | Attending: Internal Medicine | Admitting: Internal Medicine

## 2017-08-11 ENCOUNTER — Other Ambulatory Visit (INDEPENDENT_AMBULATORY_CARE_PROVIDER_SITE_OTHER): Payer: Managed Care, Other (non HMO)

## 2017-08-11 DIAGNOSIS — E119 Type 2 diabetes mellitus without complications: Secondary | ICD-10-CM

## 2017-08-11 DIAGNOSIS — E559 Vitamin D deficiency, unspecified: Secondary | ICD-10-CM | POA: Diagnosis not present

## 2017-08-11 DIAGNOSIS — Z13820 Encounter for screening for osteoporosis: Secondary | ICD-10-CM | POA: Diagnosis not present

## 2017-08-11 DIAGNOSIS — S7292XA Unspecified fracture of left femur, initial encounter for closed fracture: Secondary | ICD-10-CM | POA: Diagnosis not present

## 2017-08-11 LAB — LIPID PANEL
CHOL/HDL RATIO: 3
CHOLESTEROL: 120 mg/dL (ref 0–200)
HDL: 41.8 mg/dL (ref >39.00)
LDL CALC: 55 mg/dL (ref 0–99)
NonHDL: 78.08
TRIGLYCERIDES: 114 mg/dL (ref 0.0–149.0)
VLDL: 22.8 mg/dL (ref 0.0–40.0)

## 2017-08-11 LAB — HEPATIC FUNCTION PANEL
ALT: 32 U/L (ref 0–53)
AST: 20 U/L (ref 0–37)
Albumin: 4.2 g/dL (ref 3.5–5.2)
Alkaline Phosphatase: 96 U/L (ref 39–117)
BILIRUBIN DIRECT: 0.1 mg/dL (ref 0.0–0.3)
BILIRUBIN TOTAL: 0.6 mg/dL (ref 0.2–1.2)
Total Protein: 7.3 g/dL (ref 6.0–8.3)

## 2017-08-11 LAB — BASIC METABOLIC PANEL
BUN: 11 mg/dL (ref 6–23)
CHLORIDE: 100 meq/L (ref 96–112)
CO2: 26 meq/L (ref 19–32)
Calcium: 9.1 mg/dL (ref 8.4–10.5)
Creatinine, Ser: 0.84 mg/dL (ref 0.40–1.50)
GFR: 126.11 mL/min (ref >60.00)
Glucose, Bld: 219 mg/dL — ABNORMAL HIGH (ref 70–99)
Potassium: 4.1 mEq/L (ref 3.5–5.1)
SODIUM: 137 meq/L (ref 135–145)

## 2017-08-11 LAB — VITAMIN D 25 HYDROXY (VIT D DEFICIENCY, FRACTURES): VITD: 30.73 ng/mL (ref 30.00–100.00)

## 2017-08-12 LAB — HEMOGLOBIN A1C
Hgb A1c MFr Bld: 10.5 % of total Hgb — ABNORMAL HIGH (ref <5.7)
Mean Plasma Glucose: 255 (calc)
eAG (mmol/L): 14.1 (calc)

## 2017-08-13 ENCOUNTER — Other Ambulatory Visit: Payer: Self-pay | Admitting: Internal Medicine

## 2017-08-24 ENCOUNTER — Encounter: Payer: Self-pay | Admitting: Internal Medicine

## 2017-08-24 ENCOUNTER — Ambulatory Visit (INDEPENDENT_AMBULATORY_CARE_PROVIDER_SITE_OTHER): Payer: Managed Care, Other (non HMO) | Admitting: Internal Medicine

## 2017-08-24 ENCOUNTER — Other Ambulatory Visit: Payer: Self-pay | Admitting: Internal Medicine

## 2017-08-24 VITALS — BP 122/82 | HR 88 | Temp 98.6°F | Ht 72.0 in | Wt 294.0 lb

## 2017-08-24 DIAGNOSIS — I10 Essential (primary) hypertension: Secondary | ICD-10-CM

## 2017-08-24 DIAGNOSIS — E559 Vitamin D deficiency, unspecified: Secondary | ICD-10-CM

## 2017-08-24 DIAGNOSIS — E785 Hyperlipidemia, unspecified: Secondary | ICD-10-CM | POA: Diagnosis not present

## 2017-08-24 DIAGNOSIS — E119 Type 2 diabetes mellitus without complications: Secondary | ICD-10-CM

## 2017-08-24 DIAGNOSIS — Z0000 Encounter for general adult medical examination without abnormal findings: Secondary | ICD-10-CM | POA: Diagnosis not present

## 2017-08-24 DIAGNOSIS — M5412 Radiculopathy, cervical region: Secondary | ICD-10-CM | POA: Diagnosis not present

## 2017-08-24 MED ORDER — METFORMIN HCL ER 500 MG PO TB24: 2000.0000 mg | ORAL_TABLET | Freq: Every day | ORAL | 3 refills | Status: DC

## 2017-08-24 MED ORDER — LANCETS MISC. MISC: 11 refills | Status: AC

## 2017-08-24 MED ORDER — PIOGLITAZONE HCL 45 MG PO TABS: 45.0000 mg | ORAL_TABLET | Freq: Every day | ORAL | 3 refills | Status: DC

## 2017-08-24 MED ORDER — GABAPENTIN 100 MG PO CAPS: 100.0000 mg | ORAL_CAPSULE | Freq: Three times a day (TID) | ORAL | 0 refills | Status: DC

## 2017-08-24 MED ORDER — GLUCOSE BLOOD VI STRP: ORAL_STRIP | 11 refills | Status: DC

## 2017-08-24 MED ORDER — GABAPENTIN 300 MG PO CAPS: 300.0000 mg | ORAL_CAPSULE | Freq: Three times a day (TID) | ORAL | 5 refills | Status: DC

## 2017-08-24 NOTE — Assessment & Plan Note (Signed)
Ok for trial gabapentin asd,  to f/u any worsening symptoms or concerns  

## 2017-08-24 NOTE — Assessment & Plan Note (Signed)
stable overall by history and exam, recent data reviewed with pt, and pt to continue medical treatment as before,  to f/u any worsening symptoms or concerns BP Readings from Last 3 Encounters:  08/24/17 122/82  06/20/17 (!) 137/100  05/24/17 138/88

## 2017-08-24 NOTE — Patient Instructions (Addendum)
You will be contacted regarding the referral for: ophthalmology  Please take all new medication as prescribed - the gabapentin with starting at 100 mg three times per day for 1 week, then 300 mg three times per day after that  OK to increase the metformin ER to 4 pills in the AM (but watch for diarrhea)  OK to increase the actos to 45 mg per day (but watch for swelling in the legs)  Please check your sugars twice per day and call for persistent blood sugars more then 200  Please continue all other medications as before, and refills have been done if requested.  Please have the pharmacy call with any other refills you may need.  Please continue your efforts at being more active, low cholesterol diabetic diet, and weight control.  Please keep your appointments with your specialists as you may have planned  Please return in 3 months, or sooner if needed, with Lab testing done 3-5 days before

## 2017-08-24 NOTE — Progress Notes (Signed)
Subjective:    Patient ID: Dylan [name], male    DOB: December 15, 1970, 47 y.o.   MRN: [id number]  HPI Here to f/u; overall doing ok,  Pt denies chest pain, increasing sob or doe, wheezing, orthopnea, PND, increased LE swelling, palpitations, dizziness or syncope.  Pt denies new neurological symptoms such as new headache, or facial or extremity weakness or numbness.  Pt denies polydipsia, polyuria, or low sugar episode.  Pt states overall good compliance with meds, mostly trying to follow appropriate diet, with wt overall stable,  but little exercise however. Plans to do better, and AM CBGs have been in the 200's recently. Did also have left neck pain, saw UC then ortho now s/p cortisone injection, helped only somewhat per pt.  Still working with PT on left leg left cervical radiculitis.  Had MRI but not felt to be a surgical need at that time.  Pt feels not bad enought to warrant NCS at this time.  Did have "quite a bit" of steroid med for tx, and now off due to concern it might affect his sugar.  Not yet back to work post [employer] about Apr 08, 2017.  Pt denies new neurological symptoms such as new headache, or focal weakness.  Pt denies chest pain, increased sob or doe, wheezing, orthopnea, PND, increased LE swelling, palpitations, dizziness or syncope. Past Medical History:  Diagnosis Date  . ALLERGIC RHINITIS 11/25/2007  . ASTHMA 11/25/2007  . Asthma   . CARPAL TUNNEL SYNDROME, LEFT, MILD 11/25/2007  . DIABETES MELLITUS, TYPE II 11/25/2007  . Femur fracture, left ([facility]) 04/07/2017  . HYPERLIPIDEMIA 11/25/2007  . Metabolic syndrome 04/09/2017  . NECK PAIN 08/27/2009  . Obesity   . Other specified forms of hearing loss 05/10/2009  . Other symptoms referable to lower leg joint 11/25/2007  . SHOULDER PAIN, LEFT 08/27/2009  . Unspecified hearing loss 04/25/2008  . Vitamin D deficiency 04/09/2017   Past Surgical History:  Procedure Laterality Date  . BACK SURGERY    . FEMUR IM NAIL Left 04/08/2017   Procedure: INTRAMEDULLARY (IM) RETROGRADE FEMORAL NAILING;  Surgeon: Myrene Galas, MD;  Location: [facility] OR;  Service: Orthopedics;  Laterality: Left;  . s/p back surgury  2002   Lumbar disc    reports that he has never smoked. He has never used smokeless tobacco. He reports that he does not drink alcohol or use drugs. family history includes Diabetes in his other. Allergies  Allergen Reactions  . Penicillins Other (See Comments)    From childhood: Has patient had a PCN reaction causing immediate rash, facial/tongue/throat swelling, SOB or lightheadedness with hypotension: Unk Has patient had a PCN reaction causing severe rash involving mucus membranes or skin necrosis: Unk Has patient had a PCN reaction that required hospitalization: Unk Has patient had a PCN reaction occurring within the last 10 years: No If all of the above answers are "NO", then may proceed with Cephalosporin use.    Current Outpatient Medications on File Prior to Visit  Medication Sig Dispense Refill  . aspirin 81 MG EC tablet Take 1 tablet (81 mg total) by mouth daily. Annual appt w/labs due in Sept must see MD for refills (Patient taking differently: Take 81 mg by mouth daily. ) 90 tablet 0  . cholecalciferol 5000 units TABS Take 0.4 tablets (2,000 Units total) by mouth daily. 30 tablet 2  . glucose blood test strip Lancet strips Use as instructed once daily E11.0 100 each 11  . Lancets Misc. MISC Use  as directed once daily 100 each 11  . losartan (COZAAR) 25 MG tablet TAKE 1 TABLET BY MOUTH EVERY DAY (Patient taking differently: Take 25 mg by mouth once a day) 90 tablet 3  . metFORMIN (GLUCOPHAGE-XR) 500 MG 24 hr tablet Take 2 tablets (1,000 mg total) by mouth daily with breakfast. 180 tablet 3  . oxyCODONE-acetaminophen (PERCOCET) 7.5-325 MG tablet Take 1-2 tablets by mouth every 8 (eight) hours as needed for moderate pain or severe pain. 56 tablet 0  . pioglitazone (ACTOS) 30 MG tablet TAKE 1 TABLET (30 MG TOTAL) BY  MOUTH DAILY. 90 tablet 0  . rosuvastatin (CRESTOR) 40 MG tablet TAKE 1 TABLET (40 MG TOTAL) BY MOUTH DAILY. 90 tablet 3   No current facility-administered medications on file prior to visit.    Review of Systems  Constitutional: Negative for other unusual diaphoresis or sweats HENT: Negative for ear discharge or swelling Eyes: Negative for other worsening visual disturbances Respiratory: Negative for stridor or other swelling  Gastrointestinal: Negative for worsening distension or other blood Genitourinary: Negative for retention or other urinary change Musculoskeletal: Negative for other MSK pain or swelling Skin: Negative for color change or other new lesions Neurological: Negative for worsening tremors and other numbness  Psychiatric/Behavioral: Negative for worsening agitation or other fatigue All other system neg per pt    Objective:   Physical Exam BP 122/82   Pulse 88   Temp 98.6 F (37 C) (Oral)   Ht 6' (1.829 m)   Wt 294 lb (133.4 kg)   SpO2 97%   BMI 39.87 kg/m  VS noted,  Constitutional: Pt appears in NAD HENT: Head: NCAT.  Right Ear: External ear normal.  Left Ear: External ear normal.  Eyes: . Pupils are equal, round, and reactive to light. Conjunctivae and EOM are normal Nose: without d/c or deformity Neck: Neck supple. Gross normal ROM Cardiovascular: Normal rate and regular rhythm.   Pulmonary/Chest: Effort normal and breath sounds without rales or wheezing.  Neurological: Pt is alert. At baseline orientation, motor grossly intact Skin: Skin is warm. No rashes, other new lesions, no LE edema Psychiatric: Pt behavior is normal without agitation  No other exam findings Lab Results  Component Value Date   WBC 6.6 04/11/2017   HGB 12.0 (L) 04/11/2017   HCT 35.6 (L) 04/11/2017   PLT 180 04/11/2017   GLUCOSE 219 (H) 08/11/2017   CHOL 120 08/11/2017   TRIG 114.0 08/11/2017   HDL 41.80 08/11/2017   LDLDIRECT 105.5 02/07/2013   LDLCALC 55 08/11/2017   ALT  32 08/11/2017   AST 20 08/11/2017   NA 137 08/11/2017   K 4.1 08/11/2017   CL 100 08/11/2017   CREATININE 0.84 08/11/2017   BUN 11 08/11/2017   CO2 26 08/11/2017   TSH 1.091 04/07/2017   PSA 0.51 01/14/2017   INR 1.03 04/07/2017   HGBA1C 10.5 (H) 08/11/2017   MICROALBUR <0.7 01/14/2017       Assessment & Plan:

## 2017-08-24 NOTE — Assessment & Plan Note (Signed)
To cont oral replacement,  to f/u any worsening symptoms or concerns 

## 2017-08-24 NOTE — Assessment & Plan Note (Signed)
stable overall by history and exam, recent data reviewed with pt, and pt to continue medical treatment as before,  to f/u any worsening symptoms or concerns Lab Results  Component Value Date   LDLCALC 55 08/11/2017

## 2017-08-24 NOTE — Assessment & Plan Note (Signed)
Uncontrolled, o/w stable overall by history and exam, recent data reviewed with pt, and pt to increased actos to 45 qd, increased metformin ER 500 to 4 tabs in AM,  to f/u any worsening symptoms or concerns, call for cbg's > 200 to consider add trulicity

## 2017-11-25 ENCOUNTER — Ambulatory Visit: Payer: Managed Care, Other (non HMO) | Admitting: Internal Medicine

## 2017-12-23 ENCOUNTER — Ambulatory Visit: Payer: Managed Care, Other (non HMO) | Admitting: Internal Medicine

## 2017-12-31 ENCOUNTER — Other Ambulatory Visit (INDEPENDENT_AMBULATORY_CARE_PROVIDER_SITE_OTHER): Payer: Managed Care, Other (non HMO)

## 2017-12-31 ENCOUNTER — Other Ambulatory Visit: Payer: Self-pay

## 2017-12-31 DIAGNOSIS — E785 Hyperlipidemia, unspecified: Secondary | ICD-10-CM | POA: Diagnosis not present

## 2017-12-31 DIAGNOSIS — E119 Type 2 diabetes mellitus without complications: Secondary | ICD-10-CM

## 2017-12-31 DIAGNOSIS — I10 Essential (primary) hypertension: Secondary | ICD-10-CM

## 2017-12-31 LAB — CBC WITH DIFFERENTIAL/PLATELET
BASOS PCT: 0.5 % (ref 0.0–3.0)
Basophils Absolute: 0 10*3/uL (ref 0.0–0.1)
EOS PCT: 1.3 % (ref 0.0–5.0)
Eosinophils Absolute: 0.1 10*3/uL (ref 0.0–0.7)
HCT: 41.6 % (ref 39.0–52.0)
Hemoglobin: 14.4 g/dL (ref 13.0–17.0)
Lymphocytes Relative: 47.7 % — ABNORMAL HIGH (ref 12.0–46.0)
Lymphs Abs: 2.4 10*3/uL (ref 0.7–4.0)
MCHC: 34.7 g/dL (ref 30.0–36.0)
MCV: 83.1 fl (ref 78.0–100.0)
Monocytes Absolute: 0.4 10*3/uL (ref 0.1–1.0)
Monocytes Relative: 7.5 % (ref 3.0–12.0)
NEUTROS ABS: 2.1 10*3/uL (ref 1.4–7.7)
Neutrophils Relative %: 43 % (ref 43.0–77.0)
PLATELETS: 204 10*3/uL (ref 150.0–400.0)
RBC: 5.01 Mil/uL (ref 4.22–5.81)
RDW: 15.2 % (ref 11.5–15.5)
WBC: 5 10*3/uL (ref 4.0–10.5)

## 2017-12-31 LAB — HEPATIC FUNCTION PANEL
ALBUMIN: 4.2 g/dL (ref 3.5–5.2)
ALT: 25 U/L (ref 0–53)
AST: 16 U/L (ref 0–37)
Alkaline Phosphatase: 68 U/L (ref 39–117)
Bilirubin, Direct: 0.1 mg/dL (ref 0.0–0.3)
TOTAL PROTEIN: 7.1 g/dL (ref 6.0–8.3)
Total Bilirubin: 0.6 mg/dL (ref 0.2–1.2)

## 2017-12-31 LAB — LIPID PANEL
CHOLESTEROL: 115 mg/dL (ref 0–200)
HDL: 38.9 mg/dL — ABNORMAL LOW (ref >39.00)
LDL Cholesterol: 56 mg/dL (ref 0–99)
NONHDL: 76.48
Total CHOL/HDL Ratio: 3
Triglycerides: 100 mg/dL (ref 0.0–149.0)
VLDL: 20 mg/dL (ref 0.0–40.0)

## 2017-12-31 LAB — BASIC METABOLIC PANEL
BUN: 12 mg/dL (ref 6–23)
CALCIUM: 9.2 mg/dL (ref 8.4–10.5)
CO2: 31 mEq/L (ref 19–32)
CREATININE: 0.85 mg/dL (ref 0.40–1.50)
Chloride: 104 mEq/L (ref 96–112)
GFR: 124.19 mL/min (ref >60.00)
Glucose, Bld: 230 mg/dL — ABNORMAL HIGH (ref 70–99)
Potassium: 4.6 mEq/L (ref 3.5–5.1)
Sodium: 142 mEq/L (ref 135–145)

## 2017-12-31 LAB — URINALYSIS, ROUTINE W REFLEX MICROSCOPIC
Bilirubin Urine: NEGATIVE
Ketones, ur: NEGATIVE
Nitrite: POSITIVE — AB
Specific Gravity, Urine: 1.015 (ref 1.000–1.030)
Total Protein, Urine: NEGATIVE
Urine Glucose: 250 — AB
Urobilinogen, UA: 0.2 (ref 0.0–1.0)
pH: 6 (ref 5.0–8.0)

## 2017-12-31 LAB — PSA: PSA: 1.11 ng/mL (ref 0.10–4.00)

## 2017-12-31 LAB — MICROALBUMIN / CREATININE URINE RATIO
Creatinine,U: 130.2 mg/dL
Microalb Creat Ratio: 2 mg/g (ref 0.0–30.0)
Microalb, Ur: 2.7 mg/dL — ABNORMAL HIGH (ref 0.0–1.9)

## 2017-12-31 LAB — HEMOGLOBIN A1C: HEMOGLOBIN A1C: 8.8 % — AB (ref 4.6–6.5)

## 2017-12-31 LAB — TSH: TSH: 1.43 u[IU]/mL (ref 0.35–4.50)

## 2018-01-03 ENCOUNTER — Ambulatory Visit (INDEPENDENT_AMBULATORY_CARE_PROVIDER_SITE_OTHER): Payer: Managed Care, Other (non HMO) | Admitting: Internal Medicine

## 2018-01-03 ENCOUNTER — Encounter: Payer: Self-pay | Admitting: Internal Medicine

## 2018-01-03 VITALS — BP 114/80 | HR 75 | Temp 98.7°F | Resp 16 | Ht 72.0 in | Wt 286.0 lb

## 2018-01-03 DIAGNOSIS — Z23 Encounter for immunization: Secondary | ICD-10-CM

## 2018-01-03 DIAGNOSIS — Z0001 Encounter for general adult medical examination with abnormal findings: Secondary | ICD-10-CM

## 2018-01-03 DIAGNOSIS — E119 Type 2 diabetes mellitus without complications: Secondary | ICD-10-CM | POA: Diagnosis not present

## 2018-01-03 DIAGNOSIS — R300 Dysuria: Secondary | ICD-10-CM

## 2018-01-03 DIAGNOSIS — I10 Essential (primary) hypertension: Secondary | ICD-10-CM

## 2018-01-03 DIAGNOSIS — Z0000 Encounter for general adult medical examination without abnormal findings: Secondary | ICD-10-CM

## 2018-01-03 MED ORDER — LOSARTAN POTASSIUM 25 MG PO TABS: 25.0000 mg | ORAL_TABLET | Freq: Every day | ORAL | 3 refills | Status: DC

## 2018-01-03 MED ORDER — PIOGLITAZONE HCL 45 MG PO TABS: 45.0000 mg | ORAL_TABLET | Freq: Every day | ORAL | 3 refills | Status: DC

## 2018-01-03 MED ORDER — METFORMIN HCL ER 500 MG PO TB24: 2000.0000 mg | ORAL_TABLET | Freq: Every day | ORAL | 3 refills | Status: DC

## 2018-01-03 MED ORDER — ROSUVASTATIN CALCIUM 40 MG PO TABS: 40.0000 mg | ORAL_TABLET | Freq: Every day | ORAL | 3 refills | Status: DC

## 2018-01-03 MED ORDER — LEVOFLOXACIN 500 MG PO TABS: 500.0000 mg | ORAL_TABLET | Freq: Every day | ORAL | 0 refills | Status: AC

## 2018-01-03 NOTE — Patient Instructions (Addendum)
Please remember to call for your yearly eye doctor appt (with an MD, not just a doctor that does eye glasses - optometrist); consider Dr Dione Booze in [place] or [facility])  You had the flu shot today  Please take all new medication as prescribed - the antibiotic  Please continue all other medications as before, and refills have been done if requested.  Please have the pharmacy call with any other refills you may need.  Please continue your efforts at being more active, low cholesterol diet, and weight control.  You are otherwise up to date with prevention measures today.  Please keep your appointments with your specialists as you may have planned  Please return in 6 months, or sooner if needed, with Lab testing done 3-5 days before

## 2018-01-03 NOTE — Assessment & Plan Note (Signed)

## 2018-01-03 NOTE — Assessment & Plan Note (Addendum)
Likely uti - for levaquin asd, declines urology referral for now  In addition to the time spent performing CPE, I spent an additional 15 minutes face to face,in which greater than 50% of this time was spent in counseling and coordination of care for patient's illness as documented, including the differential dx, treatment, further evaluation and other management of UTI, DM, HTN

## 2018-01-03 NOTE — Assessment & Plan Note (Signed)
stable overall by history and exam, recent data reviewed with pt, and pt to continue medical treatment as before,  to f/u any worsening symptoms or concerns  

## 2018-01-03 NOTE — Progress Notes (Signed)
Subjective:    Patient ID: Dylan [name], male    DOB: 08-17-1970, 47 y.o.   MRN: [id number]  HPI  Here for wellness and f/u;  Overall doing ok;  Pt denies Chest pain, worsening SOB, DOE, wheezing, orthopnea, PND, worsening LE edema, palpitations, dizziness or syncope.  Pt denies neurological change such as new headache, facial or extremity weakness.  Pt denies polydipsia, polyuria, or low sugar symptoms. Pt states overall good compliance with treatment and medications, good tolerability, and has been trying to follow appropriate diet.  Pt denies worsening depressive symptoms, suicidal ideation or panic. No fever, night sweats, wt loss, loss of appetite, or other constitutional symptoms.  Pt states good ability with ADL's, has low fall risk, home safety reviewed and adequate, no other significant changes in hearing or vision, and only occasionally active with exercise. Also c/o + urinary freq and dysuria but Denies urinary symptoms such as urgency, flank pain, hematuria or n/v, fever, chills. Admits to some noncompliance with Dm meds due to size of pills.  Past Medical History:  Diagnosis Date  . ALLERGIC RHINITIS 11/25/2007  . ASTHMA 11/25/2007  . Asthma   . CARPAL TUNNEL SYNDROME, LEFT, MILD 11/25/2007  . DIABETES MELLITUS, TYPE II 11/25/2007  . Femur fracture, left (HCC) 04/07/2017  . HYPERLIPIDEMIA 11/25/2007  . Metabolic syndrome 04/09/2017  . NECK PAIN 08/27/2009  . Obesity   . Other specified forms of hearing loss 05/10/2009  . Other symptoms referable to lower leg joint 11/25/2007  . SHOULDER PAIN, LEFT 08/27/2009  . Unspecified hearing loss 04/25/2008  . Vitamin D deficiency 04/09/2017   Past Surgical History:  Procedure Laterality Date  . BACK SURGERY    . FEMUR IM NAIL Left 04/08/2017   Procedure: INTRAMEDULLARY (IM) RETROGRADE FEMORAL NAILING;  Surgeon: Myrene Galas, MD;  Location: [facility] OR;  Service: Orthopedics;  Laterality: Left;  . s/p back surgury  2002   Lumbar disc    reports that he has never smoked. He has never used smokeless tobacco. He reports that he does not drink alcohol or use drugs. family history includes Diabetes in his other. Allergies  Allergen Reactions  . Penicillins Other (See Comments)    From childhood: Has patient had a PCN reaction causing immediate rash, facial/tongue/throat swelling, SOB or lightheadedness with hypotension: Unk Has patient had a PCN reaction causing severe rash involving mucus membranes or skin necrosis: Unk Has patient had a PCN reaction that required hospitalization: Unk Has patient had a PCN reaction occurring within the last 10 years: No If all of the above answers are "NO", then may proceed with Cephalosporin use.    Current Outpatient Medications on File Prior to Visit  Medication Sig Dispense Refill  . aspirin 81 MG EC tablet Take 1 tablet (81 mg total) by mouth daily. Annual appt w/labs due in Sept must see MD for refills (Patient taking differently: Take 81 mg by mouth daily. ) 90 tablet 0  . cholecalciferol 5000 units TABS Take 0.4 tablets (2,000 Units total) by mouth daily. 30 tablet 2  . gabapentin (NEURONTIN) 300 MG capsule TAKE 1 CAPSULE BY MOUTH THREE TIMES A DAY**START WEEK 2 270 capsule 2  . glucose blood test strip Lancet strips Use as instructed twice daily due to labile sugars E11.0 200 each 11  . Lancets Misc. MISC Use as directed twice daily 200 each 11  . oxyCODONE-acetaminophen (PERCOCET) 7.5-325 MG tablet Take 1-2 tablets by mouth every 8 (eight) hours as needed for moderate pain  or severe pain. 56 tablet 0  . gabapentin (NEURONTIN) 100 MG capsule Take 1 capsule (100 mg total) by mouth 3 (three) times daily for 7 days. 21 capsule 0   No current facility-administered medications on file prior to visit.    Review of Systems Constitutional: Negative for other unusual diaphoresis, sweats, appetite or weight changes HENT: Negative for other worsening hearing loss, ear pain, facial swelling,  mouth sores or neck stiffness.   Eyes: Negative for other worsening pain, redness or other visual disturbance.  Respiratory: Negative for other stridor or swelling Cardiovascular: Negative for other palpitations or other chest pain  Gastrointestinal: Negative for worsening diarrhea or loose stools, blood in stool, distention or other pain Genitourinary: Negative for hematuria, flank pain or other change in urine volume.  Musculoskeletal: Negative for myalgias or other joint swelling.  Skin: Negative for other color change, or other wound or worsening drainage.  Neurological: Negative for other syncope or numbness. Hematological: Negative for other adenopathy or swelling Psychiatric/Behavioral: Negative for hallucinations, other worsening agitation, SI, self-injury, or new decreased concentration All other system neg per pt    Objective:   Physical Exam BP 114/80   Pulse 75   Temp 98.7 F (37.1 C) (Oral)   Resp 16   Ht 6' (1.829 m)   Wt 286 lb (129.7 kg)   SpO2 97%   BMI 38.79 kg/m  VS noted,  Constitutional: Pt is oriented to person, place, and time. Appears well-developed and well-nourished, in no significant distress and comfortable Head: Normocephalic and atraumatic  Eyes: Conjunctivae and EOM are normal. Pupils are equal, round, and reactive to light Right Ear: External ear normal without discharge Left Ear: External ear normal without discharge Nose: Nose without discharge or deformity Mouth/Throat: Oropharynx is without other ulcerations and moist  Neck: Normal range of motion. Neck supple. No JVD present. No tracheal deviation present or significant neck LA or mass Cardiovascular: Normal rate, regular rhythm, normal heart sounds and intact distal pulses.   Pulmonary/Chest: WOB normal and breath sounds without rales or wheezing  Abdominal: Soft. Bowel sounds are normal. NT. No HSM  Musculoskeletal: Normal range of motion. Exhibits no edema Lymphadenopathy: Has no other  cervical adenopathy.  Neurological: Pt is alert and oriented to person, place, and time. Pt has normal reflexes. No cranial nerve deficit. Motor grossly intact, Gait intact Skin: Skin is warm and dry. No rash noted or new ulcerations Psychiatric:  Has normal mood and affect. Behavior is normal without agitation No other exam findings Lab Results  Component Value Date   WBC 5.0 12/31/2017   HGB 14.4 12/31/2017   HCT 41.6 12/31/2017   PLT 204.0 12/31/2017   GLUCOSE 230 (H) 12/31/2017   CHOL 115 12/31/2017   TRIG 100.0 12/31/2017   HDL 38.90 (L) 12/31/2017   LDLDIRECT 105.5 02/07/2013   LDLCALC 56 12/31/2017   ALT 25 12/31/2017   AST 16 12/31/2017   NA 142 12/31/2017   K 4.6 12/31/2017   CL 104 12/31/2017   CREATININE 0.85 12/31/2017   BUN 12 12/31/2017   CO2 31 12/31/2017   TSH 1.43 12/31/2017   PSA 1.11 12/31/2017   INR 1.03 04/07/2017   HGBA1C 8.8 (H) 12/31/2017   MICROALBUR 2.7 (H) 12/31/2017   Contains abnormal data Urinalysis, Routine w reflex microscopic  Order: [id number]  Status:  Final result Visible to patient:  No (Not Released) Next appt:  07/12/2018 at 10:40 AM in Internal Medicine Oliver Barre, MD) Dx:  Type 2 diabetes  mellitus without comp...   Ref Range & Units 3d ago 8mo ago  Color, Urine Yellow;Lt. Yellow YELLOW  YELLOW   APPearance Clear CloudyAbnormal   CLEAR   Specific Gravity, Urine 1.000 - 1.030 1.015  1.010   pH 5.0 - 8.0 6.0  7.0   Total Protein, Urine Negative NEGATIVE  NEGATIVE   Urine Glucose Negative 250Abnormal   NEGATIVE   Ketones, ur Negative NEGATIVE  NEGATIVE   Bilirubin Urine Negative NEGATIVE  NEGATIVE   Hgb urine dipstick Negative TRACE-INTACTAbnormal   NEGATIVE   Urobilinogen, UA 0.0 - 1.0 0.2  0.2   Leukocytes, UA Negative MODERATEAbnormal   TRACEAbnormal    Nitrite Negative POSITIVEAbnormal   NEGATIVE   WBC, UA 0-2/hpf 11-20/hpfAbnormal   0-2/hpf   RBC / HPF 0-2/hpf 0-2/hpf  0-2/hpf   Mucus, UA None Presence ofAbnormal       Squamous Epithelial / LPF Rare(0-4/hpf) Rare(0-4/hpf)  Rare(0-4/hpf)   Bacteria, UA None Many(>50/hpf)Abnormal     Resulting Agency  [facility]      Specimen Collected: 12/31/17 13:40 Last Resulted: 12/31/17 14:12      Lab Flowsheet     Order Details     View Encounter     Lab and Collection Details     Routing     Result History          Other Results from 12/31/2017   Contains abnormal data CBC with Differential/Platelet  Order: [id number]   Status:  Final result Visible to patient:  No (Not Released) Next appt:  07/12/2018 at 10:40 AM in Internal Medicine Oliver Barre, MD) Dx:  Essential hypertension   Ref Range & Units 3d ago 19mo ago  WBC 4.0 - 10.5 K/uL 5.0  6.6   RBC 4.22 - 5.81 Mil/uL 5.01  4.15Low  R  Hemoglobin 13.0 - 17.0 g/dL 46.6  59.9JTT    HCT 01.7 - 52.0 % 41.6  35.6Low    MCV 78.0 - 100.0 fl 83.1  85.8 R  MCHC 30.0 - 36.0 g/dL 79.3  90.3   RDW 00.9 - 15.5 % 15.2  13.8   Platelets 150.0 - 400.0 K/uL 204.0  180 R  Neutrophils Relative % 43.0 - 77.0 % 43.0    Lymphocytes Relative 12.0 - 46.0 % 47.7High     Monocytes Relative 3.0 - 12.0 % 7.5    Eosinophils Relative 0.0 - 5.0 % 1.3    Basophils Relative 0.0 - 3.0 % 0.5    Neutro Abs 1.4 - 7.7 K/uL 2.1    Lymphs Abs 0.7 - 4.0 K/uL 2.4    Monocytes Absolute 0.1 - 1.0 K/uL 0.4    Eosinophils Absolute 0.0 - 0.7 K/uL 0.1    Basophils Absolute 0.0 - 0.1 K/uL 0.0    MCH   28.9   Resulting Agency   [facility]      Specimen Collected: 12/31/17 13:40 Last Resulted: 12/31/17 14:35      Lab Flowsheet     Order Details     View Encounter     Lab and Collection Details     Routing     Result History      R=Reference range differs from displayed range        PSA  Order: [id number]   Status:  Final result Visible to patient:  No (Not Released) Next appt:  07/12/2018 at 10:40 AM in Internal Medicine Oliver Barre, MD) Dx:  Essential hypertension   Ref Range &  Units 3d ago 653mo ago  PSA 0.10 - 4.00 ng/mL 1.11  0.51 CM  Comment: Test performed using Access Hybritech PSA Assay, a parmagnetic partical, chemiluminecent immunoassay.  Resulting Agency  [facility]      Specimen Collected: 12/31/17 13:40 Last Resulted: 12/31/17 15:47      Lab Flowsheet     Order Details     View Encounter     Lab and Collection Details     Routing     Result History      CM=Additional comments        Contains abnormal data Urine Microalbumin w/creat. ratio  Order: [id number]   Status:  Final result Visible to patient:  No (Not Released) Next appt:  07/12/2018 at 10:40 AM in Internal Medicine Oliver Barre, MD) Dx:  Type 2 diabetes mellitus without comp...   Ref Range & Units 3d ago 653mo ago  Microalb, Ur 0.0 - 1.9 mg/dL 4.0JWJX   <9.1   Creatinine,U mg/dL 478.2  956.2   Microalb Creat Ratio 0.0 - 30.0 mg/g 2.0  0.4   Resulting Agency  [facility]      Specimen Collected: 12/31/17 13:40 Last Resulted: 12/31/17 17:24      Lab Flowsheet     Order Details     View Encounter     Lab and Collection Details     Routing     Result History            Hepatic function panel  Order: [id number]   Status:  Final result Visible to patient:  No (Not Released) Next appt:  07/12/2018 at 10:40 AM in Internal Medicine Oliver Barre, MD) Dx:  Essential hypertension   Ref Range & Units 3d ago 53mo ago  Total Bilirubin 0.2 - 1.2 mg/dL 0.6  0.6   Bilirubin, Direct 0.0 - 0.3 mg/dL 0.1  0.1   Alkaline Phosphatase 39 - 117 U/L 68  96   AST 0 - 37 U/L 16  20   ALT 0 - 53 U/L 25  32   Total Protein 6.0 - 8.3 g/dL 7.1  7.3   Albumin 3.5 - 5.2 g/dL 4.2  4.2   Resulting Agency  [place] [facility] [place] [facility]      Specimen Collected: 12/31/17 13:40 Last Resulted: 12/31/17 16:04      Lab Flowsheet     Order Details     View Encounter     Lab and Collection Details     Routing     Result History             Contains abnormal data Basic metabolic panel  Order: [id number]   Status:  Final result Visible to patient:  No (Not Released) Next appt:  07/12/2018 at 10:40 AM in Internal Medicine Oliver Barre, MD) Dx:  Essential hypertension   Ref Range & Units 3d ago 53mo ago  Sodium 135 - 145 mEq/L 142  137   Potassium 3.5 - 5.1 mEq/L 4.6  4.1   Chloride 96 - 112 mEq/L 104  100   CO2 19 - 32 mEq/L 31  26   Glucose, Bld 70 - 99 mg/dL 132GMWN   027OZDG    BUN 6 - 23 mg/dL 12  11   Creatinine, Ser 0.40 - 1.50 mg/dL 6.44  0.34   Calcium 8.4 - 10.5 mg/dL 9.2  9.1   GFR >74.25 mL/min 124.19  126.11   Resulting Agency  [place] [facility] [place] [facility]  Specimen Collected: 12/31/17 13:40 Last Resulted: 12/31/17 16:04      Lab Flowsheet     Order Details     View Encounter     Lab and Collection Details     Routing     Result History            TSH  Order: [id number]   Status:  Final result Visible to patient:  No (Not Released) Next appt:  07/12/2018 at 10:40 AM in Internal Medicine Oliver Barre, MD) Dx:  Essential hypertension   Ref Range & Units 3d ago 61mo ago  TSH 0.35 - 4.50 uIU/mL 1.43  1.091 R, CM  Resulting Agency  [place] [facility]      Specimen Collected: 12/31/17 13:40 Last Resulted: 12/31/17 15:51      Lab Flowsheet     Order Details     View Encounter     Lab and Collection Details     Routing     Result History      CM=Additional commentsR=Reference range differs from displayed range        Contains abnormal data Lipid panel  Order: [id number]   Status:  Final result Visible to patient:  No (Not Released) Next appt:  07/12/2018 at 10:40 AM in Internal Medicine Oliver Barre, MD) Dx:  Hyperlipidemia, unspecified hyperlipi...   Ref Range & Units 3d ago 63mo ago  Cholesterol 0 - 200 mg/dL 956  213 CM  Comment: ATP III Classification    Desirable: < 200 mg/dL        Borderline High: 200 - 239 mg/dL     High: > = 086  mg/dL  Triglycerides 0.0 - 578.4 mg/dL 696.2  952.8 CM  Comment: Normal: <150 mg/dLBorderline High: 150 - 199 mg/dL  HDL >41.32 mg/dL 38.90Low   41.80   VLDL 0.0 - 40.0 mg/dL 44.0  10.2   LDL Cholesterol 0 - 99 mg/dL 56  55   Total CHOL/HDL Ratio  3  3 CM  Comment:        Men     Women1/2 Average Risk   3.4     3.3Average Risk     5.0     4.42X Average Risk     9.6     7.13X Average Risk     15.0     11.0            NonHDL  76.48  78.08 CM  Comment: NOTE: Non-HDL goal should be 30 mg/dL higher than patient's LDL goal (i.e. LDL goal of < 70 mg/dL, would have non-HDL goal of < 100 mg/dL)  Resulting Agency  [facility]      Specimen Collected: 12/31/17 13:40 Last Resulted: 12/31/17 16:04      Lab Flowsheet     Order Details     View Encounter     Lab and Collection Details     Routing     Result History      CM=Additional comments        Contains abnormal data Hemoglobin A1c  Order: [id number]   Status:  Final result Visible to patient:  No (Not Released) Next appt:  07/12/2018 at 10:40 AM in Internal Medicine Oliver Barre, MD) Dx:  Type 2 diabetes mellitus without comp...   Ref Range & Units 3d ago 63mo ago  Hgb A1c MFr Bld 4.6 - 6.5 % 8.8High   10.5High  R, CM  Comment: Glycemic Control Guidelines for People with Diabetes:Non  Diabetic: <6%Goal of Therapy: <7%Additional Action Suggested: >8%   Resulting Agency  [place] [facility]      Specimen Collected: 12/31/17 13:40 Last Resulted: 12/31/17 14:28      Lab Flowsheet     Order Details     View Encounter     Lab and Collection Details     Routing     Result History      CM=Additional commentsR=Reference range differs from displayed range      All Reviewers List   Corwin Levins, MD on 12/31/2017 20:36  Encounter   View Encounter       Result Information   Flag: Abnormal Status: Final result (Collected:  12/31/2017 13:40) Provider Status: Reviewed    Lab Information   [place] [facility]    Order-Level Documents:   There are no order-level documents.  View SmartLink Info   Urinalysis, Routine w reflex microscopic (Order #[id number]) on 12/31/17  Result Read / Acknowledged   User Time Read / Terrilyn Saver, MD 12/31/2017 8:36 PM       Assessment & Plan:

## 2018-07-12 ENCOUNTER — Ambulatory Visit: Payer: Managed Care, Other (non HMO) | Admitting: Internal Medicine

## 2018-07-27 ENCOUNTER — Telehealth: Payer: Self-pay

## 2018-07-27 NOTE — Telephone Encounter (Signed)
Pt informed, will call back to set up virtual did not want to do set it up on the phone right now

## 2018-07-27 NOTE — Telephone Encounter (Signed)
Copied from CRM [id number]. Topic: General - Other >> Jul 27, 2018  2:28 PM Dylan [name] wrote: Reason for CRM: Patient called to reschedule his appointment he want to be able to do lab work and urine check before speaking with the doctor. Please advise Ph# [phone] >> Jul 27, 2018  2:43 PM Claris Pong wrote: Labs will be expiring can labs be entered and I can set up virtual. Please send back to me to schedule

## 2018-07-28 ENCOUNTER — Ambulatory Visit: Payer: Self-pay | Admitting: Internal Medicine

## 2018-08-16 ENCOUNTER — Telehealth: Payer: Self-pay | Admitting: Physician Assistant

## 2018-08-16 DIAGNOSIS — B349 Viral infection, unspecified: Secondary | ICD-10-CM

## 2018-08-16 NOTE — Progress Notes (Signed)
E-Visit for Corona Virus Screening  Based on your current symptoms, you may very well have the virus, however your symptoms are mild. Currently, not all patients are being tested. If the symptoms are mild and there is not a known exposure, performing the test is not indicated.  Coronavirus disease 2019 (COVID-19)is a respiratory illness that can spread from person to person. The virus that causes COVID-19 is a new virus that was first identified in the country of [place] but is now found in multiple other countries and has spread to the [place].  Symptoms associated with the virus are mild to severe fever, cough, and shortness of breath. There is currently no vaccine to protect against COVID-19, and there is no specific antiviral treatment for the virus.   To be considered HIGH RISK for Coronavirus (COVID-19), you have to meet the following criteria:  . Traveled to [place], [place], [place], [place] or [place]; or in the [place] to [place], [place], [place], or [place]; and have fever, cough, and shortness of breath within the last 2 weeks of travel OR  . Been in close contact with a person diagnosed with COVID-19 within the last 2 weeks and have fever, cough, and shortness of breath  . IF YOU DO NOT MEET THESE CRITERIA, YOU ARE CONSIDERED LOW RISK FOR COVID-19.   It is vitally important that if you feel that you have an infection such as this virus or any other virus that you stay home and away from places where you may spread it to others.  You should self-quarantine for 14 days if you have symptoms that could potentially be coronavirus and avoid contact with people age 30 and older.    You may also take acetaminophen (Tylenol) as needed for fever.   Reduce your risk of any infection by using the same precautions used for avoiding the common cold or flu: Wash your hands often with soap and warm water for at least 20 seconds.  If soap and water are not readily available, use an  alcohol-based hand sanitizer with at least 60% alcohol.  If coughing or sneezing, cover your mouth and nose by coughing or sneezing into the elbow areas of your shirt or coat, into a tissue or into your sleeve (not your hands). Avoid shaking hands with others and consider head nods or verbal greetings only.  Avoid touching your eyes,nose, or mouth with unwashed hands. Avoid close contact with people who are sick. Avoid places or events with large numbers of people in one location, like concerts or sporting events. Carefully consider travel plans you have or are making. If you are planning any travel outside or inside the [place], visit the CDC'sTravelers' Health webpagefor the latest health notices. If you have some symptoms but not all symptoms, continue to monitor at home and seek medical attention if your symptoms worsen. If you are having a medical emergency, call 911.  HOME CARE Only take medications as instructed by your medical team. Drink plenty of fluids and get plenty of rest. A steam or ultrasonic humidifier can help if you have congestion.   GET HELP RIGHT AWAY IF: You develop worsening fever. You become short of breath You cough up blood. Your symptoms become more severe MAKE SURE YOU  Understand these instructions. Will watch your condition. Will get help right away if you are not doing well or get worse.  Your e-visit answers were reviewed by a board certified advanced clinical practitioner to complete your  personal care plan.  Depending on the condition, your plan could have included both over the counter or prescription medications.  If there is a problem please reply once you have received a response from your provider. Your safety is important to us.  If you have drug allergies check your prescription carefully.    You can use MyChart to ask questions about today's visit, request a non-urgent call back, or ask for a work or school excuse for 24 hours related to this  e-Visit. If it has been greater than 24 hours you will need to follow up with your provider, or enter a new e-Visit to address those concerns. You will get an e-mail in the next two days asking about your experience.  I hope that your e-visit has been valuable and will speed your recovery. Thank you for using e-visits.    ===View-only below this line===   ----- Message -----    From: Dylan [name] S Kinnamon    Sent: 08/16/2018  9:51 AM EDT      To: E-Visit Mailing List Subject: E-Visit Submission: CoronaVirus (COVID-19) Screening  E-Visit Submission: CoronaVirus (COVID-19) Screening --------------------------------  Question: Do you have any of the following?  Answer:   Shortness of breath  Question: If you are experiencing trouble breathing please select the severity of this:  Answer:   I have mild trouble breathing but not very often  Question: Do you have any of the following additional symptoms?  Answer:   Body aches            Headache            Diarrhea  Question: Have you had a fever? Answer:   No  Question: Have others in your home or workplace had similar symptoms? Answer:   Yes  Question: When did your symptoms start? Answer:   Sunday 08-14-18  Question: Have you recently visited any of the following countries? Answer:   None of these  Question: If you have traveled anywhere in the last  2 months please document where you have visited: Answer:     Question: Have you recently been around others from these countries or visited these countries who have had coughing or fever? Answer:   No  Question: Have you recently been around anyone who has been diagnosed with Corona virus? Answer:   No  Question: Have you been taking any medications? Answer:   Yes  Question: If taking medications for these symptoms, please list the names and whether they are helping or not Answer:   Tylenol and TheraFlu  Question: Are you treated for any of the following conditions: Asthma,  COPD, Diabetes, Renal Failure (on Dialysis), AIDS, any Neuromuscular disease that effects the clearing of secretions, Heart Failure, or Heart Disease? Answer:   Yes  Question: Please enter a phone number where you can be reached if we have additional questions about your symptoms Answer:   [phone]  Question: Please list your medication allergies that you may have ? (If 'none' , please list as 'none') Answer:   Allergic to penicillin  Question: Please list any additional comments  Answer:     A total of 5-10 minutes was spent evaluating this patients questionnaire and formulating a plan of care.

## 2018-08-18 ENCOUNTER — Encounter: Payer: Self-pay | Admitting: Internal Medicine

## 2018-08-18 ENCOUNTER — Ambulatory Visit (INDEPENDENT_AMBULATORY_CARE_PROVIDER_SITE_OTHER): Payer: 59 | Admitting: Internal Medicine

## 2018-08-18 DIAGNOSIS — E785 Hyperlipidemia, unspecified: Secondary | ICD-10-CM | POA: Diagnosis not present

## 2018-08-18 DIAGNOSIS — E119 Type 2 diabetes mellitus without complications: Secondary | ICD-10-CM | POA: Diagnosis not present

## 2018-08-18 DIAGNOSIS — R509 Fever, unspecified: Secondary | ICD-10-CM | POA: Diagnosis not present

## 2018-08-18 DIAGNOSIS — Z0000 Encounter for general adult medical examination without abnormal findings: Secondary | ICD-10-CM | POA: Diagnosis not present

## 2018-08-18 MED ORDER — GLIPIZIDE ER 2.5 MG PO TB24: 2.5000 mg | ORAL_TABLET | Freq: Every day | ORAL | 3 refills | Status: DC

## 2018-08-18 MED ORDER — AZITHROMYCIN 250 MG PO TABS: ORAL_TABLET | ORAL | 0 refills | Status: DC

## 2018-08-18 MED ORDER — PIOGLITAZONE HCL 45 MG PO TABS: 45.0000 mg | ORAL_TABLET | Freq: Every day | ORAL | 1 refills | Status: DC

## 2018-08-18 MED ORDER — GABAPENTIN 300 MG PO CAPS: ORAL_CAPSULE | ORAL | 1 refills | Status: DC

## 2018-08-18 MED ORDER — ROSUVASTATIN CALCIUM 40 MG PO TABS: 40.0000 mg | ORAL_TABLET | Freq: Every day | ORAL | 1 refills | Status: DC

## 2018-08-18 MED ORDER — ONDANSETRON HCL 4 MG PO TABS: 4.0000 mg | ORAL_TABLET | Freq: Three times a day (TID) | ORAL | 1 refills | Status: DC | PRN

## 2018-08-18 MED ORDER — LOSARTAN POTASSIUM 25 MG PO TABS: 25.0000 mg | ORAL_TABLET | Freq: Every day | ORAL | 1 refills | Status: DC

## 2018-08-18 MED ORDER — HYDROCODONE-HOMATROPINE 5-1.5 MG/5ML PO SYRP: 5.0000 mL | ORAL_SOLUTION | Freq: Four times a day (QID) | ORAL | 0 refills | Status: AC | PRN

## 2018-08-18 NOTE — Assessment & Plan Note (Signed)
Mild uncontrolled, to add glipizide ER 2.5 qd, cont all other tx, diet and wt control efforts, for a1c and labs when he is improved and able to come out of self isolation to our lab

## 2018-08-18 NOTE — Patient Instructions (Signed)
Please take all new medication as prescribed - the zpack, cough med, zofran and glipizide ER 2.5 mg per day  Please continue all other medications as before, and refills have been done if requested.  Please have the pharmacy call with any other refills you may need.  Please continue your efforts at being more active, low cholesterol diet, and weight control.  Please keep your appointments with your specialists as you may have planned  Please go to the LAB in the Basement (turn left off the elevator) for the tests to be done at your convenience soon after you can come out of self isolation  You will be contacted by phone if any changes need to be made immediately.  Otherwise, you will receive a letter about your results with an explanation, but please check with MyChart first.  Please remember to sign up for MyChart if you have not done so, as this will be important to you in the future with finding out test results, communicating by private email, and scheduling acute appointments online when needed.  Please return in 6 months, or sooner if needed, with Lab testing done 3-5 days before

## 2018-08-18 NOTE — Progress Notes (Signed)
Patient ID: Dylan Hollerichawona S Hendrickson, male   DOB: 11/25/1970, 48 y.o.   MRN: [id number]  Virtual Visit via Video Note  I connected with Dylan [name] on 08/18/18 at  8:00 AM EDT by a video enabled telemedicine application and verified that I am speaking with the correct person using two identifiers. I am at home, pt is at the office, and no other persons present   I discussed the limitations of evaluation and management by telemedicine and the availability of in person appointments. The patient expressed understanding and agreed to proceed.  History of Present Illness: Here with 5 days onset HA, fever constantly up and down and omly some better with tylenol, aches all over getting worse instead of better, nasal and upper resp congestion, mild non prod cough, with nausea and several loose stools.  No vomiting and is really working on taking fluids well.  He is self isolated from family in upstairs bedroom.  Has had some sob but is very reluctant to go to ED, Pt denies chest pain, wheezing, orthopnea, PND, increased LE swelling, palpitations, dizziness or syncope.  SOB seems worse to lie down at night, but not coughing more, still has to sleep propped up on pillows.  Denies worsening reflux, abd pain, dysphagia, or blood.  Denies urinary symptoms such as dysuria, frequency, urgency, flank pain, hematuria or n/v, fever, chills.  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   CBG's have been 200s and worse since onset of illness though has taken less calories. Past Medical History:  Diagnosis Date  . ALLERGIC RHINITIS 11/25/2007  . ASTHMA 11/25/2007  . Asthma   . CARPAL TUNNEL SYNDROME, LEFT, MILD 11/25/2007  . DIABETES MELLITUS, TYPE II 11/25/2007  . Femur fracture, left (HCC) 04/07/2017  . HYPERLIPIDEMIA 11/25/2007  . Metabolic syndrome 04/09/2017  . NECK PAIN 08/27/2009  . Obesity   . Other specified forms  of hearing loss 05/10/2009  . Other symptoms referable to lower leg joint 11/25/2007  . SHOULDER PAIN, LEFT 08/27/2009  . Unspecified hearing loss 04/25/2008  . Vitamin D deficiency 04/09/2017   Past Surgical History:  Procedure Laterality Date  . BACK SURGERY    . FEMUR IM NAIL Left 04/08/2017   Procedure: INTRAMEDULLARY (IM) RETROGRADE FEMORAL NAILING;  Surgeon: Myrene GalasHandy, Michael, MD;  Location: [facility] OR;  Service: Orthopedics;  Laterality: Left;  . s/p back surgury  2002   Lumbar disc    reports that he has never smoked. He has never used smokeless tobacco. He reports that he does not drink alcohol or use drugs. family history includes Diabetes in an other family member. Allergies  Allergen Reactions  . Penicillins Other (See Comments)    From childhood: Has patient had a PCN reaction causing immediate rash, facial/tongue/throat swelling, SOB or lightheadedness with hypotension: Unk Has patient had a PCN reaction causing severe rash involving mucus membranes or skin necrosis: Unk Has patient had a PCN reaction that required hospitalization: Unk Has patient had a PCN reaction occurring within the last 10 years: No If all of the above answers are "NO", then may proceed with Cephalosporin use.    Current Outpatient Medications on File Prior to Visit  Medication Sig Dispense Refill  . aspirin 81 MG EC tablet Take 1 tablet (81 mg total) by mouth daily. Annual appt w/labs due in Sept must see MD for refills (Patient taking differently:  Take 81 mg by mouth daily. ) 90 tablet 0  . cholecalciferol 5000 units TABS Take 0.4 tablets (2,000 Units total) by mouth daily. 30 tablet 2  . gabapentin (NEURONTIN) 300 MG capsule TAKE 1 CAPSULE BY MOUTH THREE TIMES A DAY**START WEEK 2 270 capsule 2  . glucose blood test strip Lancet strips Use as instructed twice daily due to labile sugars E11.0 200 each 11  . Lancets Misc. MISC Use as directed twice daily 200 each 11  . losartan (COZAAR) 25 MG tablet Take 1  tablet (25 mg total) by mouth daily. 90 tablet 3  . metFORMIN (GLUCOPHAGE-XR) 500 MG 24 hr tablet Take 4 tablets (2,000 mg total) by mouth daily with breakfast. 360 tablet 3  . pioglitazone (ACTOS) 45 MG tablet Take 1 tablet (45 mg total) by mouth daily. 90 tablet 3  . rosuvastatin (CRESTOR) 40 MG tablet Take 1 tablet (40 mg total) by mouth daily. 90 tablet 3  . gabapentin (NEURONTIN) 100 MG capsule Take 1 capsule (100 mg total) by mouth 3 (three) times daily for 7 days. 21 capsule 0   No current facility-administered medications on file prior to visit.    Observations/Objective: Alert, NAD, mild ill appaering, fatigued, obese, cn 2-12 intact, moves all 4s, no visible rash Lab Results  Component Value Date   WBC 5.0 12/31/2017   HGB 14.4 12/31/2017   HCT 41.6 12/31/2017   PLT 204.0 12/31/2017   GLUCOSE 230 (H) 12/31/2017   CHOL 115 12/31/2017   TRIG 100.0 12/31/2017   HDL 38.90 (L) 12/31/2017   LDLDIRECT 105.5 02/07/2013   LDLCALC 56 12/31/2017   ALT 25 12/31/2017   AST 16 12/31/2017   NA 142 12/31/2017   K 4.6 12/31/2017   CL 104 12/31/2017   CREATININE 0.85 12/31/2017   BUN 12 12/31/2017   CO2 31 12/31/2017   TSH 1.43 12/31/2017   PSA 1.11 12/31/2017   INR 1.03 04/07/2017   HGBA1C 8.8 (H) 12/31/2017   MICROALBUR 2.7 (H) 12/31/2017   Assessment and Plan: See notes  Follow Up Instructions: See notes   I discussed the assessment and treatment plan with the patient. The patient was provided an opportunity to ask questions and all were answered. The patient agreed with the plan and demonstrated an understanding of the instructions.   The patient was advised to call back or seek an in-person evaluation if the symptoms worsen or if the condition fails to improve as anticipated.   Oliver Barre, MD

## 2018-08-18 NOTE — Assessment & Plan Note (Signed)
I suspect this is c/w URI/bronchitis, cant r/o bacterial or viral, and with the fever, cough and sob we have to consider possible COVID.  I d/w pt I would offer lab work and CXR, except with his presentation, he is better off presenting to the [facility] ED that is equipped to handle possible COVID19 and other viral/bacterial illness.  I d/w him he may not be tested for covid19 as testing is currently being limited to persons requiring admission.  He declines at this time, thinking his sob is not bad enough for admit, so he would not want to go at this time.  We d/w continued social distancing, rest, fluids, tylenol, immodium prn, and I will send zpack, cough med prn, and zofran.  He declines need for inhaler at this time, but promises to go to ED at any time for worsening sob.

## 2018-08-18 NOTE — Assessment & Plan Note (Signed)
Mild elevated LDL, continue statin, low chol diet, and f/u with labs when able

## 2018-08-19 ENCOUNTER — Other Ambulatory Visit: Payer: 59

## 2018-08-19 ENCOUNTER — Other Ambulatory Visit (INDEPENDENT_AMBULATORY_CARE_PROVIDER_SITE_OTHER): Payer: 59

## 2018-08-19 DIAGNOSIS — E119 Type 2 diabetes mellitus without complications: Secondary | ICD-10-CM

## 2018-08-19 DIAGNOSIS — R509 Fever, unspecified: Secondary | ICD-10-CM | POA: Diagnosis not present

## 2018-08-19 DIAGNOSIS — Z794 Long term (current) use of insulin: Principal | ICD-10-CM

## 2018-08-19 DIAGNOSIS — E1100 Type 2 diabetes mellitus with hyperosmolarity without nonketotic hyperglycemic-hyperosmolar coma (NKHHC): Secondary | ICD-10-CM

## 2018-08-19 LAB — CBC WITH DIFFERENTIAL/PLATELET
Basophils Absolute: 0 10*3/uL (ref 0.0–0.1)
Basophils Relative: 0.9 % (ref 0.0–3.0)
Eosinophils Absolute: 0 10*3/uL (ref 0.0–0.7)
Eosinophils Relative: 0.1 % (ref 0.0–5.0)
HCT: 46.3 % (ref 39.0–52.0)
Hemoglobin: 15.9 g/dL (ref 13.0–17.0)
Lymphocytes Relative: 50.9 % — ABNORMAL HIGH (ref 12.0–46.0)
Lymphs Abs: 2.6 10*3/uL (ref 0.7–4.0)
MCHC: 34.5 g/dL (ref 30.0–36.0)
MCV: 85.5 fl (ref 78.0–100.0)
Monocytes Absolute: 0.6 10*3/uL (ref 0.1–1.0)
Monocytes Relative: 11.3 % (ref 3.0–12.0)
Neutro Abs: 1.9 10*3/uL (ref 1.4–7.7)
Neutrophils Relative %: 36.8 % — ABNORMAL LOW (ref 43.0–77.0)
Platelets: 157 10*3/uL (ref 150.0–400.0)
RBC: 5.41 Mil/uL (ref 4.22–5.81)
RDW: 13.9 % (ref 11.5–15.5)
WBC: 5 10*3/uL (ref 4.0–10.5)

## 2018-08-19 LAB — BASIC METABOLIC PANEL
BUN: 11 mg/dL (ref 6–23)
CO2: 29 mEq/L (ref 19–32)
Calcium: 8.7 mg/dL (ref 8.4–10.5)
Chloride: 97 mEq/L (ref 96–112)
Creatinine, Ser: 1 mg/dL (ref 0.40–1.50)
GFR: 96.6 mL/min (ref >60.00)
Glucose, Bld: 363 mg/dL — ABNORMAL HIGH (ref 70–99)
Potassium: 4 mEq/L (ref 3.5–5.1)
Sodium: 136 mEq/L (ref 135–145)

## 2018-08-19 LAB — LIPID PANEL
Cholesterol: 180 mg/dL (ref 0–200)
HDL: 28.7 mg/dL — ABNORMAL LOW (ref >39.00)
Total CHOL/HDL Ratio: 6
Triglycerides: 419 mg/dL — ABNORMAL HIGH (ref 0.0–149.0)

## 2018-08-19 LAB — HEPATIC FUNCTION PANEL
ALT: 33 U/L (ref 0–53)
AST: 24 U/L (ref 0–37)
Albumin: 4.2 g/dL (ref 3.5–5.2)
Alkaline Phosphatase: 72 U/L (ref 39–117)
Bilirubin, Direct: 0.1 mg/dL (ref 0.0–0.3)
Total Bilirubin: 0.5 mg/dL (ref 0.2–1.2)
Total Protein: 7.4 g/dL (ref 6.0–8.3)

## 2018-08-19 NOTE — Progress Notes (Unsigned)
a1c

## 2018-08-20 LAB — HEMOGLOBIN A1C
Hgb A1c MFr Bld: 11.1 % of total Hgb — ABNORMAL HIGH (ref <5.7)
Mean Plasma Glucose: 272 (calc)
eAG (mmol/L): 15.1 (calc)

## 2018-08-21 ENCOUNTER — Encounter (HOSPITAL_COMMUNITY): Payer: Self-pay | Admitting: *Deleted

## 2018-08-21 ENCOUNTER — Emergency Department (HOSPITAL_COMMUNITY): Payer: 59

## 2018-08-21 ENCOUNTER — Emergency Department (HOSPITAL_COMMUNITY)
Admission: EM | Admit: 2018-08-21 | Discharge: 2018-08-21 | Disposition: A | Discharge status: Home / Self Care | Payer: 59 | Attending: Emergency Medicine | Admitting: Emergency Medicine

## 2018-08-21 ENCOUNTER — Other Ambulatory Visit: Payer: Self-pay

## 2018-08-21 DIAGNOSIS — Z20822 Contact with and (suspected) exposure to covid-19: Secondary | ICD-10-CM

## 2018-08-21 DIAGNOSIS — Z79899 Other long term (current) drug therapy: Secondary | ICD-10-CM | POA: Diagnosis not present

## 2018-08-21 DIAGNOSIS — R509 Fever, unspecified: Secondary | ICD-10-CM | POA: Insufficient documentation

## 2018-08-21 DIAGNOSIS — R197 Diarrhea, unspecified: Secondary | ICD-10-CM | POA: Insufficient documentation

## 2018-08-21 DIAGNOSIS — R531 Weakness: Secondary | ICD-10-CM | POA: Insufficient documentation

## 2018-08-21 DIAGNOSIS — E119 Type 2 diabetes mellitus without complications: Secondary | ICD-10-CM | POA: Insufficient documentation

## 2018-08-21 DIAGNOSIS — I10 Essential (primary) hypertension: Secondary | ICD-10-CM | POA: Insufficient documentation

## 2018-08-21 DIAGNOSIS — R112 Nausea with vomiting, unspecified: Secondary | ICD-10-CM | POA: Insufficient documentation

## 2018-08-21 DIAGNOSIS — R5381 Other malaise: Secondary | ICD-10-CM | POA: Diagnosis not present

## 2018-08-21 DIAGNOSIS — R51 Headache: Secondary | ICD-10-CM | POA: Diagnosis not present

## 2018-08-21 DIAGNOSIS — M791 Myalgia, unspecified site: Secondary | ICD-10-CM | POA: Insufficient documentation

## 2018-08-21 DIAGNOSIS — J45909 Unspecified asthma, uncomplicated: Secondary | ICD-10-CM | POA: Diagnosis not present

## 2018-08-21 DIAGNOSIS — Z7982 Long term (current) use of aspirin: Secondary | ICD-10-CM | POA: Diagnosis not present

## 2018-08-21 DIAGNOSIS — R6889 Other general symptoms and signs: Secondary | ICD-10-CM

## 2018-08-21 DIAGNOSIS — R0981 Nasal congestion: Secondary | ICD-10-CM | POA: Diagnosis not present

## 2018-08-21 LAB — CBC WITH DIFFERENTIAL/PLATELET
Abs Immature Granulocytes: 0.01 10*3/uL (ref 0.00–0.07)
Basophils Absolute: 0 10*3/uL (ref 0.0–0.1)
Basophils Relative: 0 %
Eosinophils Absolute: 0 10*3/uL (ref 0.0–0.5)
Eosinophils Relative: 0 %
HCT: 45.5 % (ref 39.0–52.0)
Hemoglobin: 15.1 g/dL (ref 13.0–17.0)
Immature Granulocytes: 0 %
Lymphocytes Relative: 20 %
Lymphs Abs: 0.9 10*3/uL (ref 0.7–4.0)
MCH: 28.4 pg (ref 26.0–34.0)
MCHC: 33.2 g/dL (ref 30.0–36.0)
MCV: 85.5 fL (ref 80.0–100.0)
Monocytes Absolute: 0.4 10*3/uL (ref 0.1–1.0)
Monocytes Relative: 10 %
Neutro Abs: 2.9 10*3/uL (ref 1.7–7.7)
Neutrophils Relative %: 70 %
Platelets: 154 10*3/uL (ref 150–400)
RBC: 5.32 MIL/uL (ref 4.22–5.81)
RDW: 13.1 % (ref 11.5–15.5)
WBC: 4.2 10*3/uL (ref 4.0–10.5)
nRBC: 0 % (ref 0.0–0.2)

## 2018-08-21 LAB — COMPREHENSIVE METABOLIC PANEL
ALT: 39 U/L (ref 0–44)
AST: 29 U/L (ref 15–41)
Albumin: 3.8 g/dL (ref 3.5–5.0)
Alkaline Phosphatase: 61 U/L (ref 38–126)
Anion gap: 10 (ref 5–15)
BUN: 9 mg/dL (ref 6–20)
CO2: 24 mmol/L (ref 22–32)
Calcium: 8.4 mg/dL — ABNORMAL LOW (ref 8.9–10.3)
Chloride: 98 mmol/L (ref 98–111)
Creatinine, Ser: 0.81 mg/dL (ref 0.61–1.24)
GFR calc Af Amer: >60 mL/min (ref >60)
GFR calc non Af Amer: >60 mL/min (ref >60)
Glucose, Bld: 256 mg/dL — ABNORMAL HIGH (ref 70–99)
Potassium: 3.7 mmol/L (ref 3.5–5.1)
Sodium: 132 mmol/L — ABNORMAL LOW (ref 135–145)
Total Bilirubin: 0.7 mg/dL (ref 0.3–1.2)
Total Protein: 7.8 g/dL (ref 6.5–8.1)

## 2018-08-21 MED ORDER — ONDANSETRON HCL 4 MG/2ML IJ SOLN
4.0000 mg | Freq: Once | INTRAMUSCULAR | Status: AC
  Administered 2018-08-21: 12:00:00 4 mg via INTRAVENOUS
  Filled 2018-08-21: qty 2

## 2018-08-21 MED ORDER — SODIUM CHLORIDE 0.9 % IV BOLUS
1000.0000 mL | Freq: Once | INTRAVENOUS | Status: AC
  Administered 2018-08-21: 1000 mL via INTRAVENOUS

## 2018-08-21 MED ORDER — ACETAMINOPHEN 325 MG PO TABS
650.0000 mg | ORAL_TABLET | Freq: Once | ORAL | Status: AC
  Administered 2018-08-21: 12:00:00 650 mg via ORAL
  Filled 2018-08-21: qty 2

## 2018-08-21 NOTE — ED Triage Notes (Signed)
Pt feels like he needs testing for Covid 19. Pt began feeling weak, nasal congestion, diarrhea and had chills 1 weeks ago. Pt had e-visit and was placed on azithromycin, cough syrup, and nausea. Pt states he has not been coughing. Pt states he had a temperature of 100.7 this morning.

## 2018-08-21 NOTE — ED Notes (Signed)
Bed: WA12 Expected date:  Expected time:  Means of arrival:  Comments: Negative Pressure 

## 2018-08-21 NOTE — ED Provider Notes (Signed)
[facility]-EMERGENCY DEPT Provider Note   CSN: [id number] Arrival date & time: 08/21/18  1042    History   Chief Complaint Chief Complaint  Patient presents with  . Fever  . Diarrhea  . Weakness    HPI Dylan [name] is a 48 y.o. male.  He is complaining of being sick for over a week.  He has had on and off fevers chills nausea vomiting diarrhea cough and shortness of breath.  He had a virtual visit with his PCP a few days ago and was put on Zithromax and a cough syrup.  He continues to feel no better.  He has had no known Covid exposures but works at [employer] and multiple people there have been sick with Covid.  T-max of 100.5.  Also complaining of headache body aches and malaise.  Cough is nonproductive.  No blood in the vomit or diarrhea.     The history is provided by the patient.  Fever  Max temp prior to arrival:  100.5 Temp source:  Oral Severity:  Moderate Onset quality:  Gradual Duration:  1 week Timing:  Intermittent Progression:  Unchanged Chronicity:  New Relieved by:  Acetaminophen Worsened by:  Nothing Associated symptoms: chest pain, chills, cough, diarrhea, headaches, myalgias, nausea and vomiting   Associated symptoms: no confusion, no congestion, no dysuria, no ear pain, no rash, no rhinorrhea, no somnolence and no sore throat   Risk factors: sick contacts   Risk factors: no recent travel   Diarrhea  Associated symptoms: chills, fever, headaches, myalgias and vomiting   Associated symptoms: no abdominal pain   Weakness  Associated symptoms: chest pain, cough, diarrhea, fever, headaches, myalgias, nausea and vomiting   Associated symptoms: no abdominal pain, no dysuria and no shortness of breath     Past Medical History:  Diagnosis Date  . ALLERGIC RHINITIS 11/25/2007  . ASTHMA 11/25/2007  . Asthma   . CARPAL TUNNEL SYNDROME, LEFT, MILD 11/25/2007  . DIABETES MELLITUS, TYPE II 11/25/2007  . Femur fracture, left (HCC) 04/07/2017  .  HYPERLIPIDEMIA 11/25/2007  . Metabolic syndrome 04/09/2017  . NECK PAIN 08/27/2009  . Obesity   . Other specified forms of hearing loss 05/10/2009  . Other symptoms referable to lower leg joint 11/25/2007  . SHOULDER PAIN, LEFT 08/27/2009  . Unspecified hearing loss 04/25/2008  . Vitamin D deficiency 04/09/2017    Patient Active Problem List   Diagnosis Date Noted  . Febrile illness 08/18/2018  . Radiculitis of left cervical region 08/24/2017  . Vitamin D deficiency 04/09/2017  . Metabolic syndrome 04/09/2017  . Femur fracture, left (HCC) 04/07/2017  . Obesity   . Dysuria 01/15/2017  . Mood altered 07/10/2016  . Left facial swelling 01/10/2016  . Bilateral knee pain 07/05/2015  . Psychosis (HCC) 02/07/2013  . Diabetes (HCC) 02/07/2013  . Hearing loss, left 02/07/2013  . Bilateral hearing loss 07/21/2012  . Preventative health care 12/23/2010  . SHOULDER PAIN, LEFT 08/27/2009  . NECK PAIN 08/27/2009  . Other specified forms of hearing loss 05/10/2009  . Essential hypertension 10/25/2008  . Hyperlipidemia 11/25/2007  . CARPAL TUNNEL SYNDROME, LEFT, MILD 11/25/2007  . ALLERGIC RHINITIS 11/25/2007  . Asthma 11/25/2007  . Other symptoms referable to lower leg joint 11/25/2007    Past Surgical History:  Procedure Laterality Date  . BACK SURGERY    . FEMUR IM NAIL Left 04/08/2017   Procedure: INTRAMEDULLARY (IM) RETROGRADE FEMORAL NAILING;  Surgeon: Myrene Galas, MD;  Location: [facility] OR;  Service: Orthopedics;  Laterality: Left;  . s/p back surgury  2002   Lumbar disc        Home Medications    Prior to Admission medications   Medication Sig Start Date End Date Taking? Authorizing Provider  aspirin 81 MG EC tablet Take 1 tablet (81 mg total) by mouth daily. Annual appt w/labs due in Sept must see MD for refills Patient taking differently: Take 81 mg by mouth daily.  11/09/16   Corwin Levins, MD  azithromycin [facility]) 250 MG tablet 2 tab by mouth day 1, then 1 per day  08/18/18   Corwin Levins, MD  cholecalciferol 5000 units TABS Take 0.4 tablets (2,000 Units total) by mouth daily. 04/09/17   Montez Morita, PA-C  gabapentin (NEURONTIN) 100 MG capsule Take 1 capsule (100 mg total) by mouth 3 (three) times daily for 7 days. 08/24/17 08/31/17  Corwin Levins, MD  gabapentin (NEURONTIN) 300 MG capsule TAKE 1 CAPSULE BY MOUTH THREE TIMES A DAY**START WEEK 2 08/18/18   Corwin Levins, MD  glipiZIDE (GLUCOTROL XL) 2.5 MG 24 hr tablet Take 1 tablet (2.5 mg total) by mouth daily with breakfast. 08/18/18   Corwin Levins, MD  glucose blood test strip Lancet strips Use as instructed twice daily due to labile sugars E11.0 08/24/17   Corwin Levins, MD  HYDROcodone-homatropine [facility]) 5-1.5 MG/5ML syrup Take 5 mLs by mouth every 6 (six) hours as needed for up to 10 days. 08/18/18 08/28/18  Corwin Levins, MD  Lancets Misc. MISC Use as directed twice daily 08/24/17   Corwin Levins, MD  losartan (COZAAR) 25 MG tablet Take 1 tablet (25 mg total) by mouth daily. 08/18/18   Corwin Levins, MD  metFORMIN (GLUCOPHAGE-XR) 500 MG 24 hr tablet Take 4 tablets (2,000 mg total) by mouth daily with breakfast. 01/03/18   Corwin Levins, MD  ondansetron (ZOFRAN) 4 MG tablet Take 1 tablet (4 mg total) by mouth every 8 (eight) hours as needed for nausea or vomiting. 08/18/18   Corwin Levins, MD  pioglitazone (ACTOS) 45 MG tablet Take 1 tablet (45 mg total) by mouth daily. 08/18/18   Corwin Levins, MD  rosuvastatin (CRESTOR) 40 MG tablet Take 1 tablet (40 mg total) by mouth daily. 08/18/18   Corwin Levins, MD    Family History Family History  Problem Relation Age of Onset  . Diabetes Other     Social History Social History   Tobacco Use  . Smoking status: Never Smoker  . Smokeless tobacco: Never Used  Substance Use Topics  . Alcohol use: No  . Drug use: No     Allergies   Penicillins   Review of Systems Review of Systems  Constitutional: Positive for chills and fever.  HENT: Negative for  congestion, ear pain, rhinorrhea and sore throat.   Eyes: Negative for visual disturbance.  Respiratory: Positive for cough. Negative for shortness of breath.   Cardiovascular: Positive for chest pain.  Gastrointestinal: Positive for diarrhea, nausea and vomiting. Negative for abdominal pain.  Genitourinary: Negative for dysuria.  Musculoskeletal: Positive for myalgias.  Skin: Negative for rash.  Neurological: Positive for weakness and headaches.  Psychiatric/Behavioral: Negative for confusion.     Physical Exam Updated Vital Signs BP 118/82 (BP Location: Left Arm)   Pulse (!) 111   Temp 99.2 F (37.3 C) (Oral)   Resp 18   SpO2 96%   Physical Exam Vitals signs and nursing note reviewed.  Constitutional:      General: He is not in acute distress.    Appearance: Normal appearance. He is well-developed. He is not toxic-appearing.  HENT:     Head: Normocephalic and atraumatic.  Eyes:     Conjunctiva/sclera: Conjunctivae normal.  Neck:     Musculoskeletal: Neck supple.  Cardiovascular:     Rate and Rhythm: Regular rhythm. Tachycardia present.     Pulses: Normal pulses.     Heart sounds: No murmur.  Pulmonary:     Effort: Pulmonary effort is normal. No respiratory distress.     Breath sounds: Normal breath sounds.  Abdominal:     Palpations: Abdomen is soft.     Tenderness: There is no abdominal tenderness.  Musculoskeletal: Normal range of motion.        General: No signs of injury.     Right lower leg: No edema.     Left lower leg: No edema.  Skin:    General: Skin is warm and dry.     Capillary Refill: Capillary refill takes less than 2 seconds.  Neurological:     General: No focal deficit present.     Mental Status: He is alert and oriented to person, place, and time.     Sensory: No sensory deficit.     Motor: No weakness.     Gait: Gait normal.      ED Treatments / Results  Labs (all labs ordered are listed, but only abnormal results are displayed) Labs  Reviewed  COMPREHENSIVE METABOLIC PANEL - Abnormal; Notable for the following components:      Result Value   Sodium 132 (*)    Glucose, Bld 256 (*)    Calcium 8.4 (*)    All other components within normal limits  NOVEL CORONAVIRUS, NAA (HOSPITAL ORDER, SEND-OUT TO REF LAB)  CBC WITH DIFFERENTIAL/PLATELET  HIV ANTIBODY (ROUTINE TESTING W REFLEX)    EKG None  Radiology Dg Chest Port 1 View  Result Date: 08/21/2018 CLINICAL DATA:  48 year old male with history of weakness, nasal congestion, diarrhea and chills. EXAM: PORTABLE CHEST 1 VIEW COMPARISON:  Chest x-ray 04/07/2017. FINDINGS: Lung volumes are normal. No consolidative airspace disease. No pleural effusions. No pneumothorax. No pulmonary nodule or mass noted. Pulmonary vasculature and the cardiomediastinal silhouette are within normal limits. IMPRESSION: No radiographic evidence of acute cardiopulmonary disease. Electronically Signed   By: Trudie Reed M.D.   On: 08/21/2018 14:54    Procedures Procedures (including critical care time)  Medications Ordered in ED Medications  sodium chloride 0.9 % bolus 1,000 mL (has no administration in time range)  ondansetron (ZOFRAN) injection 4 mg (has no administration in time range)  acetaminophen (TYLENOL) tablet 650 mg (has no administration in time range)     Initial Impression / Assessment and Plan / ED Course  I have reviewed the triage vital signs and the nursing notes.  Pertinent labs & imaging results that were available during my care of the patient were reviewed by me and considered in my medical decision making (see chart for details).  Clinical Course as of Aug 21 1133  Sun Aug 21, 2018  [id number] 48 year old diabetic here with intermittent fevers cough nausea vomiting diarrhea.  Differential diagnosis includes Covid, viral syndrome, infectious diarrhea, dehydration.  Will do a send out test for Covid along with checking some screening labs and giving him IV fluids and  nausea medication.  Tylenol for fever.   [MB]    Clinical Course User Index [MB] Charm Barges,  Kayleen MemosMichael C, MD   Charlott Hollerichawona S Kittle was evaluated in Emergency Department on 08/21/2018 for the symptoms described in the history of present illness. He was evaluated in the context of the global COVID-19 pandemic, which necessitated consideration that the patient might be at risk for infection with the SARS-CoV-2 virus that causes COVID-19. Institutional protocols and algorithms that pertain to the evaluation of patients at risk for COVID-19 are in a state of rapid change based on information released by regulatory bodies including the CDC and federal and state organizations. These policies and algorithms were followed during the patient's care in the ED.      Final Clinical Impressions(s) / ED Diagnoses   Final diagnoses:  Generalized weakness  Diarrhea, unspecified type  Fever, unspecified fever cause  Suspected Covid-19 Virus Infection    ED Discharge Orders    None       Terrilee FilesButler, Michael C, MD 08/22/18 1136

## 2018-08-21 NOTE — Discharge Instructions (Signed)
You were seen in the emergency department for fever weakness diarrhea and possible Covid infection.  Your blood work was fairly unremarkable and your Covid test is still pending.  You can follow-up on these results on MyChart.  Will be important for you to isolate until your testing is back and you are symptom-free for at least 3 days.  Please drink plenty of fluids and rest.  You can use some Imodium for diarrhea.  Please follow-up with your doctor and return if any worsening symptoms.

## 2018-08-21 NOTE — ED Notes (Signed)
Patient is tolerating fluids w/o any complaints or difficulties.

## 2018-08-22 ENCOUNTER — Other Ambulatory Visit (INDEPENDENT_AMBULATORY_CARE_PROVIDER_SITE_OTHER): Payer: 59

## 2018-08-22 ENCOUNTER — Ambulatory Visit (INDEPENDENT_AMBULATORY_CARE_PROVIDER_SITE_OTHER): Payer: 59 | Admitting: Internal Medicine

## 2018-08-22 ENCOUNTER — Encounter: Payer: Self-pay | Admitting: Internal Medicine

## 2018-08-22 DIAGNOSIS — J45909 Unspecified asthma, uncomplicated: Secondary | ICD-10-CM

## 2018-08-22 DIAGNOSIS — E119 Type 2 diabetes mellitus without complications: Secondary | ICD-10-CM

## 2018-08-22 DIAGNOSIS — Z0000 Encounter for general adult medical examination without abnormal findings: Secondary | ICD-10-CM

## 2018-08-22 LAB — URINALYSIS, ROUTINE W REFLEX MICROSCOPIC
Bilirubin Urine: NEGATIVE
Hgb urine dipstick: NEGATIVE
Ketones, ur: NEGATIVE
Leukocytes,Ua: NEGATIVE
Nitrite: NEGATIVE
Specific Gravity, Urine: 1.015 (ref 1.000–1.030)
Total Protein, Urine: NEGATIVE
Urine Glucose: >=1000 — AB
Urobilinogen, UA: 0.2 (ref 0.0–1.0)
pH: 5.5 (ref 5.0–8.0)

## 2018-08-22 LAB — MICROALBUMIN / CREATININE URINE RATIO
Creatinine,U: 54.8 mg/dL
Microalb Creat Ratio: 1.3 mg/g (ref 0.0–30.0)
Microalb, Ur: 0.7 mg/dL (ref 0.0–1.9)

## 2018-08-22 LAB — LDL CHOLESTEROL, DIRECT: Direct LDL: 91 mg/dL

## 2018-08-22 LAB — HIV ANTIBODY (ROUTINE TESTING W REFLEX): HIV Screen 4th Generation wRfx: NONREACTIVE

## 2018-08-22 MED ORDER — GLIPIZIDE ER 10 MG PO TB24: 10.0000 mg | ORAL_TABLET | Freq: Every day | ORAL | 3 refills | Status: DC

## 2018-08-22 MED ORDER — METFORMIN HCL ER 500 MG PO TB24: 500.0000 mg | ORAL_TABLET | Freq: Every day | ORAL | 3 refills | Status: DC

## 2018-08-22 NOTE — Patient Instructions (Signed)
Ok to increase the glipizide ER to 10 mg per day  Ok to decrease the metformin ER 500 mg to 1 pill in the AM  Please continue all other medications as before, and refills have been done if requested.  Please have the pharmacy call with any other refills you may need.  Please continue your efforts at being more active, low cholesterol diet, and weight control.  You are otherwise up to date with prevention measures today.  Please keep your appointments with your specialists as you may have planned  Please go back to the [facility] ED if you have worsening fever, cough, or shortness of breath  Please return in 3 months, or sooner if needed, with Lab testing done 3-5 days before (the office will call)

## 2018-08-22 NOTE — Assessment & Plan Note (Signed)
stable overall by history and exam, recent data reviewed with pt, and pt to continue medical treatment as before,  to f/u any worsening symptoms or concerns  

## 2018-08-22 NOTE — Assessment & Plan Note (Signed)
Uncontrolled, to increase the glipizide er 10 qd , cont actos 45, and reduce metformin ER 500 qd, pt declines other injectable such as dpp4 or insulin

## 2018-08-22 NOTE — Progress Notes (Signed)
Patient ID: Dylan [name], male   DOB: 1971/02/28, 48 y.o.   MRN: [id number]  Virtual Visit via Video Note  I connected with [name] on 08/22/18 at  1:40 PM EDT by a video enabled telemedicine application and verified that I am speaking with the correct person using two identifiers. Pt is at home, I am at office, no other persons present   I discussed the limitations of evaluation and management by telemedicine and the availability of in person appointments. The patient expressed understanding and agreed to proceed.  History of Present Illness: Pt c/o 3 days onset low grade temp with nausea, crampy abd pains and watery diarrheal stools, somewhat better after recent ED eval and immodium , still feels mild ill and fatigue with malaise. Also has recurring diarrhea with metformin so has not really been taking   Recent labs with normal WBC. Pt denies chest pain, increased sob or doe, wheezing, orthopnea, PND, increased LE swelling, palpitations, dizziness or syncope.  Pt denies new neurological symptoms such as new headache, or facial or extremity weakness or numbness   Pt denies polydipsia, polyuria,    Past Medical History:  Diagnosis Date  . ALLERGIC RHINITIS 11/25/2007  . ASTHMA 11/25/2007  . Asthma   . CARPAL TUNNEL SYNDROME, LEFT, MILD 11/25/2007  . DIABETES MELLITUS, TYPE II 11/25/2007  . Femur fracture, left ([facility]) 04/07/2017  . HYPERLIPIDEMIA 11/25/2007  . Metabolic syndrome 04/09/2017  . NECK PAIN 08/27/2009  . Obesity   . Other specified forms of hearing loss 05/10/2009  . Other symptoms referable to lower leg joint 11/25/2007  . SHOULDER PAIN, LEFT 08/27/2009  . Unspecified hearing loss 04/25/2008  . Vitamin D deficiency 04/09/2017   Past Surgical History:  Procedure Laterality Date  . BACK SURGERY    . FEMUR IM NAIL Left 04/08/2017   Procedure: INTRAMEDULLARY (IM) RETROGRADE FEMORAL NAILING;  Surgeon: Myrene Galas, MD;  Location: [facility] OR;  Service: Orthopedics;  Laterality:  Left;  . s/p back surgury  2002   Lumbar disc    reports that he has never smoked. He has never used smokeless tobacco. He reports that he does not drink alcohol or use drugs. family history includes Diabetes in an other family member. Allergies  Allergen Reactions  . Penicillins Other (See Comments)    From childhood: Has patient had a PCN reaction causing immediate rash, facial/tongue/throat swelling, SOB or lightheadedness with hypotension: Unk Has patient had a PCN reaction causing severe rash involving mucus membranes or skin necrosis: Unk Has patient had a PCN reaction that required hospitalization: Unk Has patient had a PCN reaction occurring within the last 10 years: No If all of the above answers are "NO", then may proceed with Cephalosporin use.    Current Outpatient Medications on File Prior to Visit  Medication Sig Dispense Refill  . acetaminophen (TYLENOL) 325 MG tablet Take 650 mg by mouth every 6 (six) hours as needed for moderate pain.    . cholecalciferol 5000 units TABS Take 0.4 tablets (2,000 Units total) by mouth daily. 30 tablet 2  . gabapentin (NEURONTIN) 300 MG capsule TAKE 1 CAPSULE BY MOUTH THREE TIMES A DAY**START WEEK 2 270 capsule 1  . glucose blood test strip Lancet strips Use as instructed twice daily due to labile sugars E11.0 200 each 11  . HYDROcodone-homatropine (HYCODAN) 5-1.5 MG/5ML syrup Take 5 mLs by mouth every 6 (six) hours as needed for up to 10 days. 180 mL 0  . Lancets Misc. MISC Use  as directed twice daily 200 each 11  . losartan (COZAAR) 25 MG tablet Take 1 tablet (25 mg total) by mouth daily. 90 tablet 1  . pioglitazone (ACTOS) 45 MG tablet Take 1 tablet (45 mg total) by mouth daily. 90 tablet 1  . rosuvastatin (CRESTOR) 40 MG tablet Take 1 tablet (40 mg total) by mouth daily. 90 tablet 1   No current facility-administered medications on file prior to visit.    Observations/Objective: Mild ill appearing, NAD, fatigued, mentation well,  appropriate mood and affect, cn 2-12 intact, moves all 4s Lab Results  Component Value Date   WBC 4.2 08/21/2018   HGB 15.1 08/21/2018   HCT 45.5 08/21/2018   PLT 154 08/21/2018   GLUCOSE 256 (H) 08/21/2018   CHOL 180 08/19/2018   TRIG 419.0 (H) 08/19/2018   HDL 28.70 (L) 08/19/2018   LDLDIRECT 91.0 08/19/2018   LDLCALC 56 12/31/2017   ALT 39 08/21/2018   AST 29 08/21/2018   NA 132 (L) 08/21/2018   K 3.7 08/21/2018   CL 98 08/21/2018   CREATININE 0.81 08/21/2018   BUN 9 08/21/2018   CO2 24 08/21/2018   TSH 1.43 12/31/2017   PSA 1.11 12/31/2017   INR 1.03 04/07/2017   HGBA1C 11.1 (H) 08/19/2018   MICROALBUR 0.7 08/22/2018   Assessment and Plan: See notes  Follow Up Instructions: See notes   I discussed the assessment and treatment plan with the patient. The patient was provided an opportunity to ask questions and all were answered. The patient agreed with the plan and demonstrated an understanding of the instructions.   The patient was advised to call back or seek an in-person evaluation if the symptoms worsen or if the condition fails to improve as anticipated.   [name], MD

## 2018-08-23 ENCOUNTER — Telehealth: Payer: Self-pay | Admitting: Internal Medicine

## 2018-08-23 ENCOUNTER — Encounter: Payer: Self-pay | Admitting: Internal Medicine

## 2018-08-23 ENCOUNTER — Ambulatory Visit (INDEPENDENT_AMBULATORY_CARE_PROVIDER_SITE_OTHER): Payer: 59 | Admitting: Internal Medicine

## 2018-08-23 DIAGNOSIS — E119 Type 2 diabetes mellitus without complications: Secondary | ICD-10-CM

## 2018-08-23 DIAGNOSIS — J45909 Unspecified asthma, uncomplicated: Secondary | ICD-10-CM

## 2018-08-23 LAB — NOVEL CORONAVIRUS, NAA (HOSP ORDER, SEND-OUT TO REF LAB; TAT 18-24 HRS): SARS-CoV-2, NAA: DETECTED — AB

## 2018-08-23 NOTE — Progress Notes (Addendum)
Patient ID: Dylan [name], male   DOB: 15-Jul-1970, 48 y.o.   MRN: [id number]  Virtual Visit via Video Note  I connected with Dylan [name] on 08/23/18 at 12:00 PM EDT by a video enabled telemedicine application and verified that I am speaking with the correct person using two identifiers. I am at the office, pt is at home, and wife is present off camera.   I discussed the limitations of evaluation and management by telemedicine and the availability of in person appointments. The patient expressed understanding and agreed to proceed.  History of Present Illness: Here to f/u recent covid19 testing at ED, now determined Positive/detected.  Pt states still feels poorly with feverish, fatigue, malaise, URI like congestion, non prod occasional cough but Pt denies chest pain, increased sob or doe, wheezing, orthopnea, PND, increased LE swelling, palpitations, dizziness or syncope. Was first symptomatic apr 19. Asks when going back to work.    Pt denies new neurological symptoms such as new headache, or facial or extremity weakness or numbness   Pt denies polydipsia, polyuria,  No other new compalints  Wife and rest of famiky are asymptomatic but asking if they can be tested. Past Medical History:  Diagnosis Date  . ALLERGIC RHINITIS 11/25/2007  . ASTHMA 11/25/2007  . Asthma   . CARPAL TUNNEL SYNDROME, LEFT, MILD 11/25/2007  . DIABETES MELLITUS, TYPE II 11/25/2007  . Femur fracture, left ([facility]) 04/07/2017  . HYPERLIPIDEMIA 11/25/2007  . Metabolic syndrome 04/09/2017  . NECK PAIN 08/27/2009  . Obesity   . Other specified forms of hearing loss 05/10/2009  . Other symptoms referable to lower leg joint 11/25/2007  . SHOULDER PAIN, LEFT 08/27/2009  . Unspecified hearing loss 04/25/2008  . Vitamin D deficiency 04/09/2017   Past Surgical History:  Procedure Laterality Date  . BACK SURGERY    . FEMUR IM NAIL Left 04/08/2017   Procedure: INTRAMEDULLARY (IM) RETROGRADE FEMORAL NAILING;  Surgeon: Myrene Galas, MD;  Location: [facility] OR;  Service: Orthopedics;  Laterality: Left;  . s/p back surgury  2002   Lumbar disc    reports that he has never smoked. He has never used smokeless tobacco. He reports that he does not drink alcohol or use drugs. family history includes Diabetes in an other family member. Allergies  Allergen Reactions  . Penicillins Other (See Comments)    From childhood: Has patient had a PCN reaction causing immediate rash, facial/tongue/throat swelling, SOB or lightheadedness with hypotension: Unk Has patient had a PCN reaction causing severe rash involving mucus membranes or skin necrosis: Unk Has patient had a PCN reaction that required hospitalization: Unk Has patient had a PCN reaction occurring within the last 10 years: No If all of the above answers are "NO", then may proceed with Cephalosporin use.    Current Outpatient Medications on File Prior to Visit  Medication Sig Dispense Refill  . acetaminophen (TYLENOL) 325 MG tablet Take 650 mg by mouth every 6 (six) hours as needed for moderate pain.    . cholecalciferol 5000 units TABS Take 0.4 tablets (2,000 Units total) by mouth daily. 30 tablet 2  . gabapentin (NEURONTIN) 300 MG capsule TAKE 1 CAPSULE BY MOUTH THREE TIMES A DAY**START WEEK 2 270 capsule 1  . glipiZIDE (GLUCOTROL XL) 10 MG 24 hr tablet Take 1 tablet (10 mg total) by mouth daily with breakfast. 90 tablet 3  . glucose blood test strip Lancet strips Use as instructed twice daily due to labile sugars E11.0 200 each 11  .  HYDROcodone-homatropine (HYCODAN) 5-1.5 MG/5ML syrup Take 5 mLs by mouth every 6 (six) hours as needed for up to 10 days. 180 mL 0  . Lancets Misc. MISC Use as directed twice daily 200 each 11  . losartan (COZAAR) 25 MG tablet Take 1 tablet (25 mg total) by mouth daily. 90 tablet 1  . metFORMIN (GLUCOPHAGE-XR) 500 MG 24 hr tablet Take 1 tablet (500 mg total) by mouth daily with breakfast. 90 tablet 3  . pioglitazone (ACTOS) 45 MG tablet  Take 1 tablet (45 mg total) by mouth daily. 90 tablet 1  . rosuvastatin (CRESTOR) 40 MG tablet Take 1 tablet (40 mg total) by mouth daily. 90 tablet 1   No current facility-administered medications on file prior to visit.     Observations/Objective: Alert, seems less ill to me today though may be simply less fatigued than last visit, NAD, mentating well, appropriate mood and affect cn 2-12 intact, moves all 4s, no visible rash or swelling Lab Results  Component Value Date   WBC 4.2 08/21/2018   HGB 15.1 08/21/2018   HCT 45.5 08/21/2018   PLT 154 08/21/2018   GLUCOSE 256 (H) 08/21/2018   CHOL 180 08/19/2018   TRIG 419.0 (H) 08/19/2018   HDL 28.70 (L) 08/19/2018   LDLDIRECT 91.0 08/19/2018   LDLCALC 56 12/31/2017   ALT 39 08/21/2018   AST 29 08/21/2018   NA 132 (L) 08/21/2018   K 3.7 08/21/2018   CL 98 08/21/2018   CREATININE 0.81 08/21/2018   BUN 9 08/21/2018   CO2 24 08/21/2018   TSH 1.43 12/31/2017   PSA 1.11 12/31/2017   INR 1.03 04/07/2017   HGBA1C 11.1 (H) 08/19/2018   MICROALBUR 0.7 08/22/2018   Assessment and Plan: See  notes  Follow Up Instructions: See notes   I discussed the assessment and treatment plan with the patient. The patient was provided an opportunity to ask questions and all were answered. The patient agreed with the plan and demonstrated an understanding of the instructions.   The patient was advised to call back or seek an in-person evaluation if the symptoms worsen or if the condition fails to improve as anticipated.   [name], MD

## 2018-08-23 NOTE — Patient Instructions (Signed)
Your COVID19 testing was Positive  Please self isolate at home  You are given the work note, and you should not return to work until at least Aug 29, 2018 or until 7 days after your symptoms have resolved  Please continue the rest, fluids and tylenol  Please continue all other medications as before, and refills have been done if requested.  Please have the pharmacy call with any other refills you may need.  Please keep your appointments with your specialists as you may have planned

## 2018-08-23 NOTE — Telephone Encounter (Signed)
This encounter was created in error - please disregard.

## 2018-08-23 NOTE — Assessment & Plan Note (Signed)
stable overall by history and exam, recent data reviewed with pt, and pt to continue medical treatment as before,  to f/u any worsening symptoms or concerns  

## 2018-08-23 NOTE — Telephone Encounter (Signed)
Pt called to receive coronavirus results. Test was positive and call warm transferred to office.

## 2018-08-23 NOTE — Assessment & Plan Note (Addendum)
D/w pt positive testing, with information to be relayed to the [facility], cont to treat with symptomatic and supportive care, reassured, and advised to present again to ED for any worsening cough or sob; work note given with tentative return to work date of May 4, but should only go back if asymptomatic for at least 7 days.  Advised that currently asymptomatic persons even in direct contact with positive case are not being ttested, though this may change with advent of current efforts per the CDC to begin contact tracing

## 2018-08-24 ENCOUNTER — Telehealth: Payer: Self-pay | Admitting: Internal Medicine

## 2018-08-24 NOTE — Telephone Encounter (Signed)
I have spoken to patient about scheduling a 3 month and 6 month fu.  Patient stated he would call back to schedule these appointments.

## 2018-09-23 ENCOUNTER — Telehealth: Payer: Self-pay | Admitting: *Deleted

## 2018-09-23 NOTE — Telephone Encounter (Signed)
I called pt and left a voicemail for her to return my call regarding plasma donation.  I gave [phone] and let the person answering the phone know she was calling regarding plasma donation.

## 2019-01-28 IMAGING — DX DG CHEST 1V PORT
1 series · 1 of 1 positions shown · non-contrast
Comparison: Chest x-ray 02/20/2008.

CLINICAL DATA: 46-year-old male under preoperative evaluation.
History of leg fracture.

EXAM:
PORTABLE CHEST 1 VIEW

[chest ap]
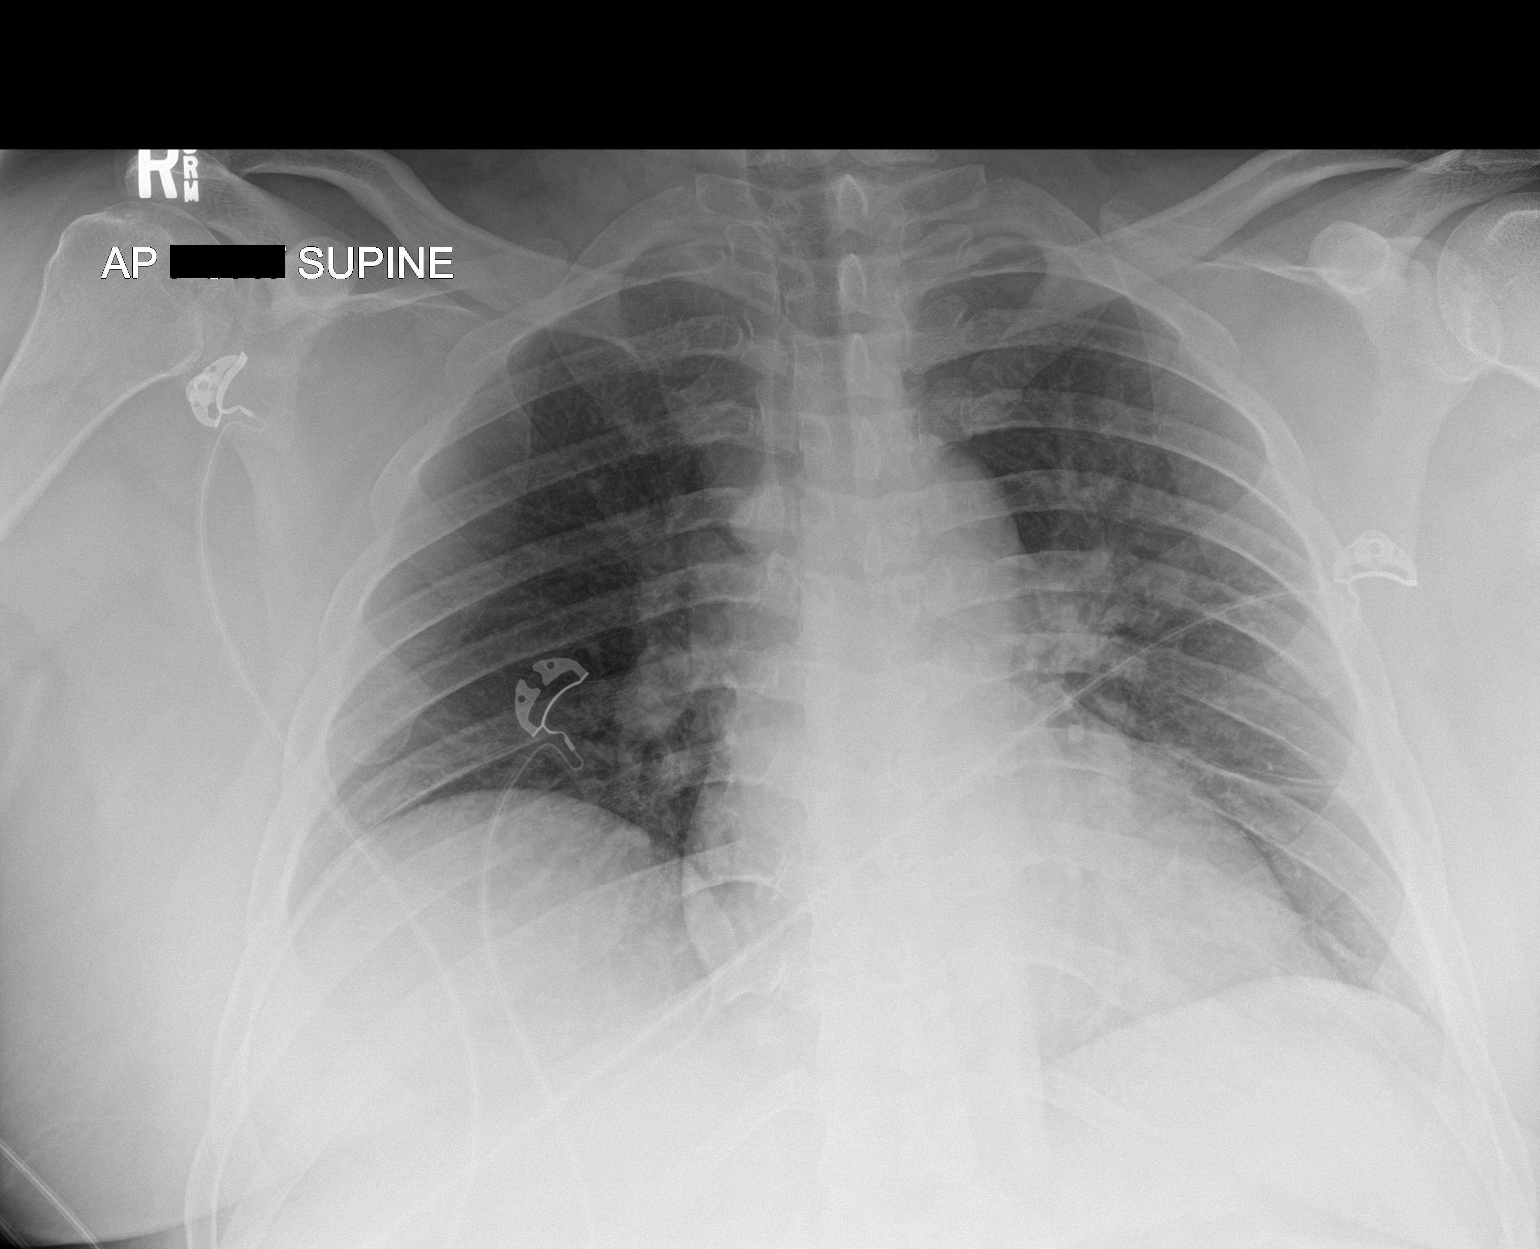

[1 of 1 positions shown; findings below may reference images not displayed]

FINDINGS: Mild linear scarring or subsegmental atelectasis in the lower left
hemithorax either in the lingula or left lower lobe. Lung volumes
are low. No consolidative airspace disease. No pleural effusions. No
evidence pulmonary edema. Heart size is normal. Upper mediastinal
contours are within normal limits.
IMPRESSION: 1. Mild linear scarring or subsegmental atelectasis in the left
lower lung. No radiographic evidence of acute cardiopulmonary
disease.

## 2019-01-28 IMAGING — DX DG FEMUR 2+V*L*
4 series · 4 of 4 positions shown · non-contrast
Comparison: None.

CLINICAL DATA: Fall.  Left femur pain

EXAM:
LEFT FEMUR 2 VIEWS

[femur ap (1 of 2)]
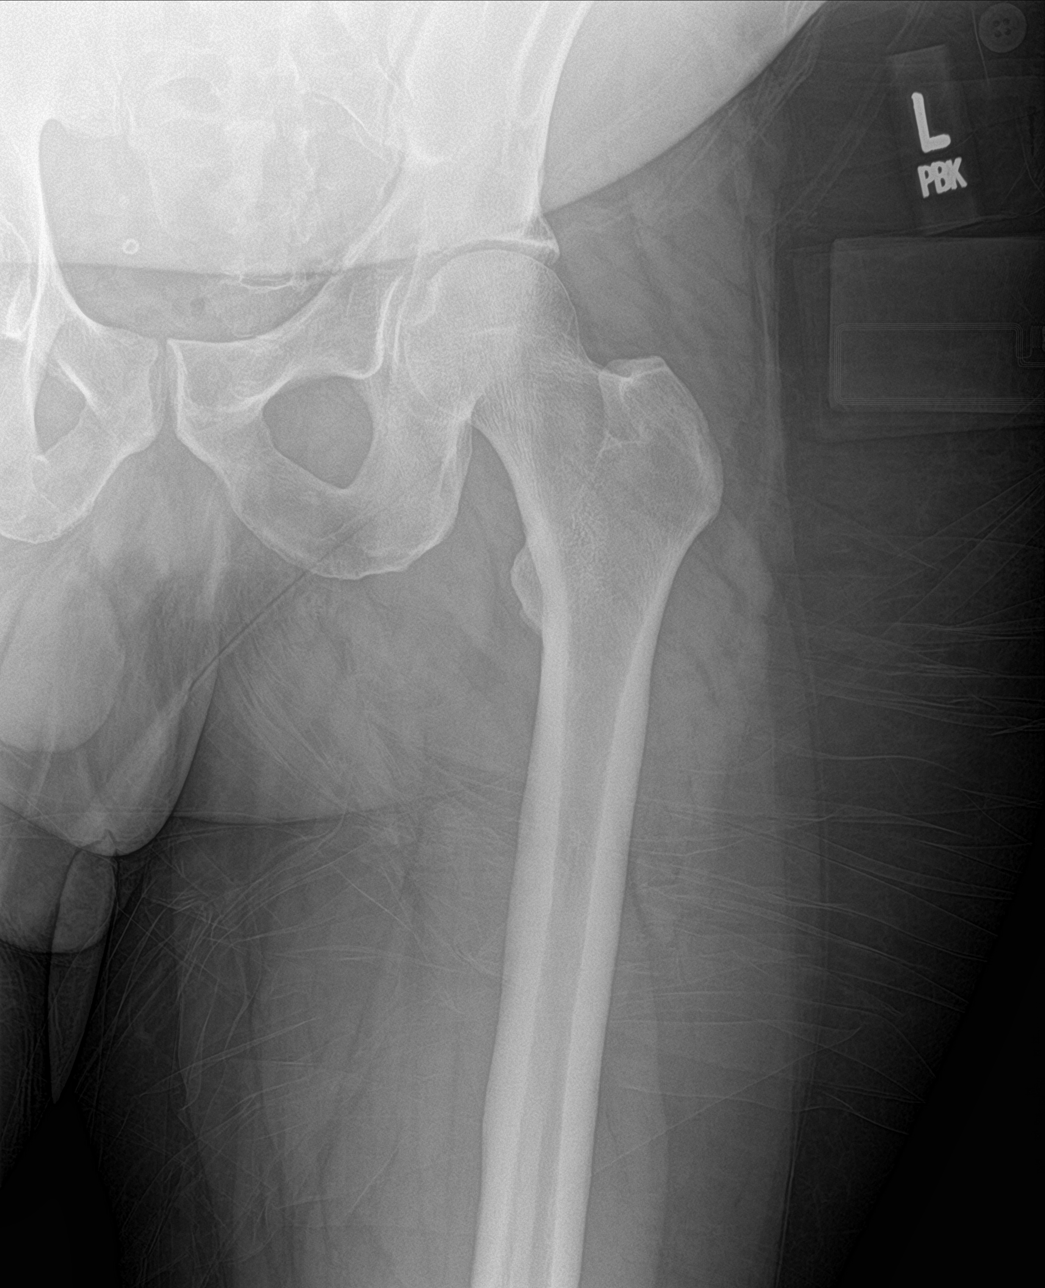

[femur ap (2 of 2)]
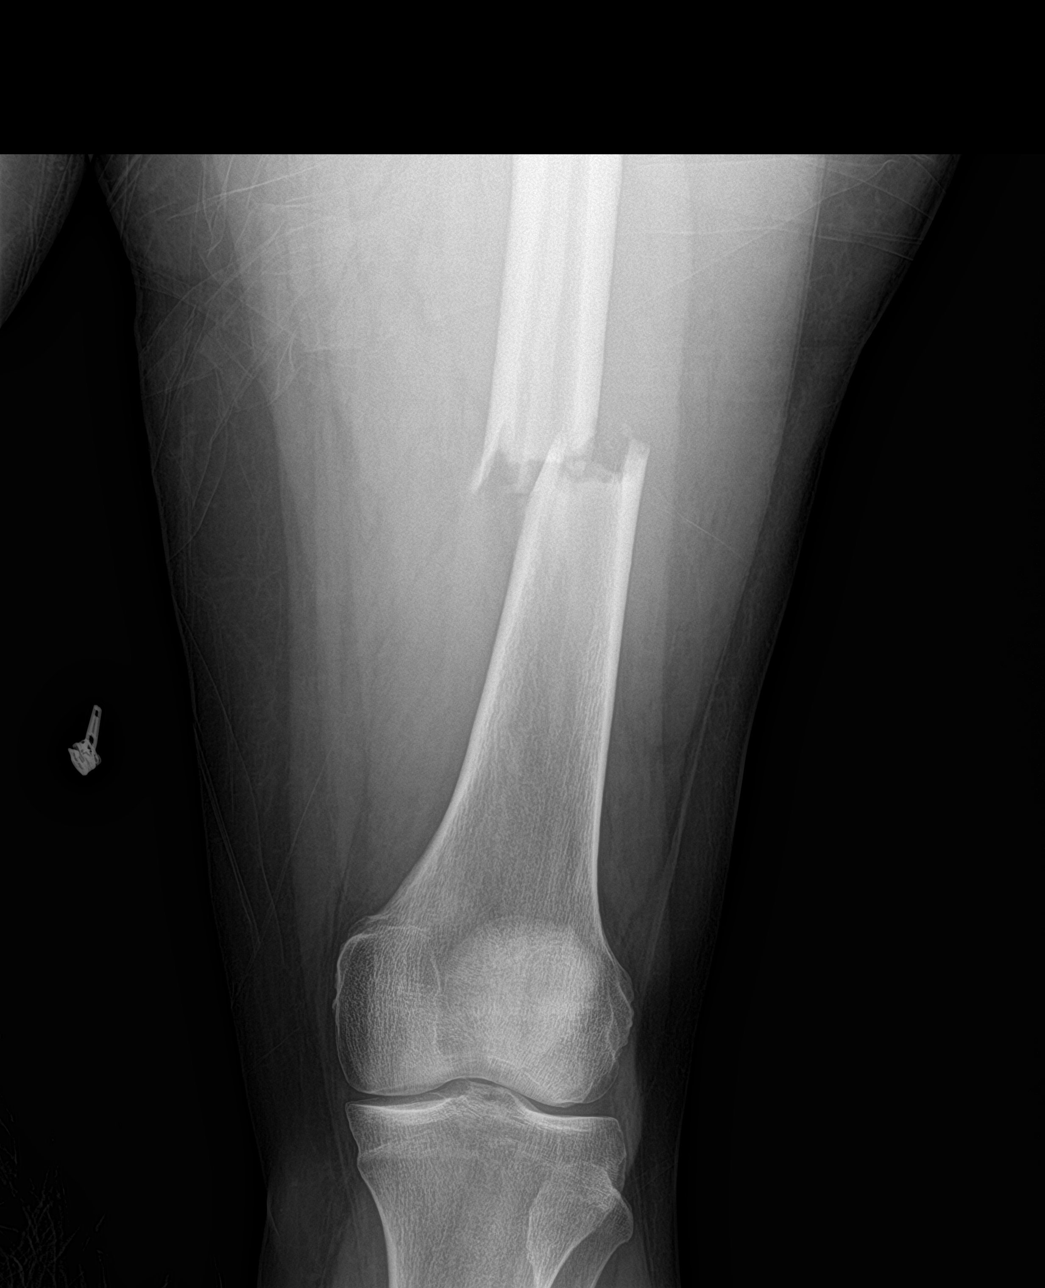

[femur lat (1 of 2)]
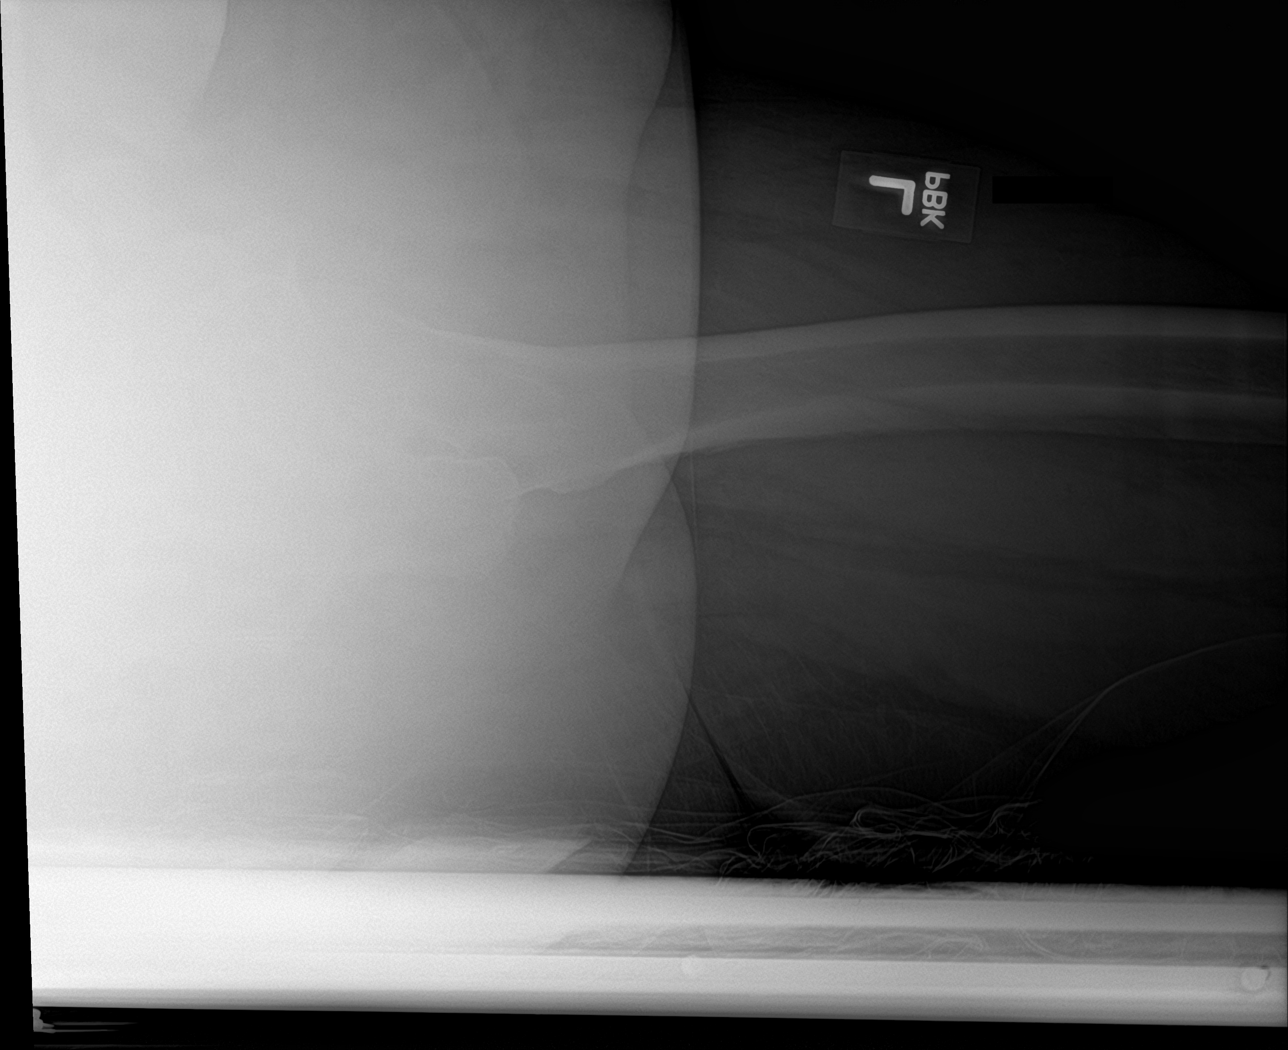

[femur lat (2 of 2)]
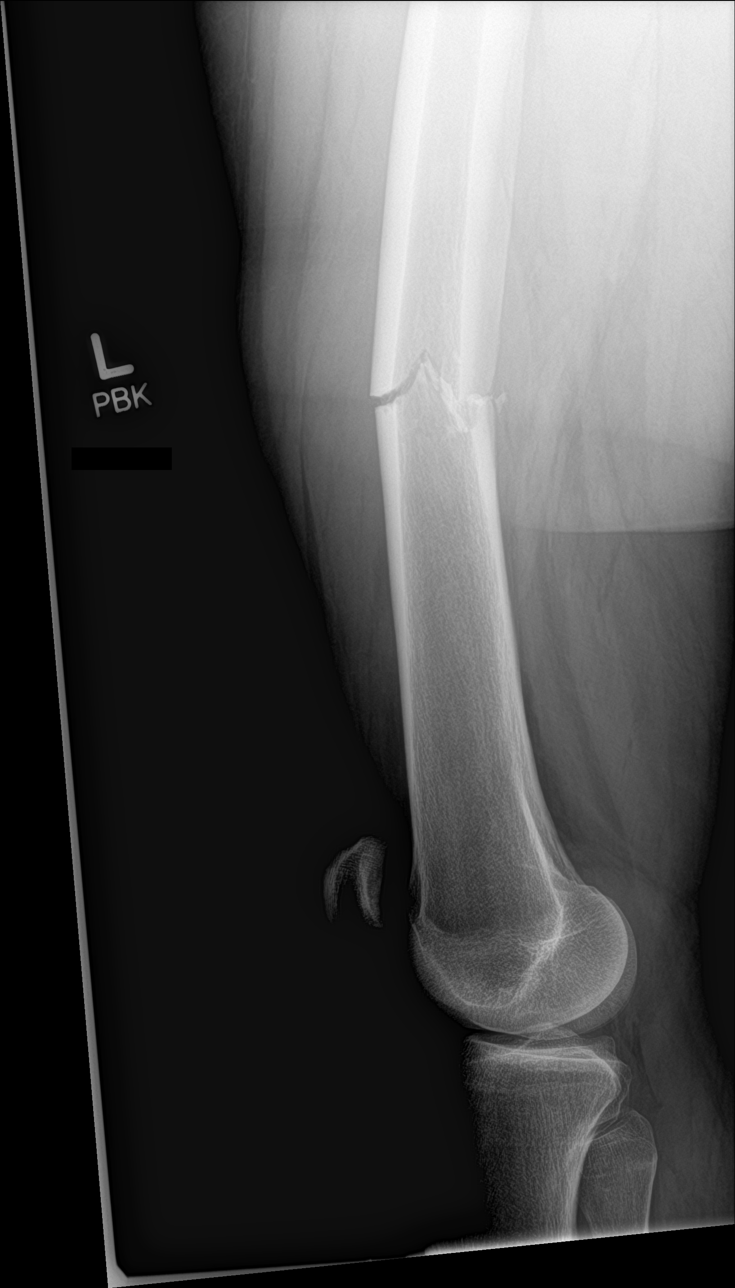

[4 of 4 positions shown; findings below may reference images not displayed]

FINDINGS: There is a transverse fracture through the left femoral distal
shaft. Approximately 1 shaft with lateral displacement of the distal
fragments.
IMPRESSION: Displaced transverse fracture through the distal left femoral shaft.

## 2019-02-18 ENCOUNTER — Other Ambulatory Visit: Payer: Self-pay | Admitting: Internal Medicine

## 2019-07-27 ENCOUNTER — Other Ambulatory Visit: Payer: 59

## 2019-07-27 ENCOUNTER — Encounter: Payer: Self-pay | Admitting: Internal Medicine

## 2019-07-27 ENCOUNTER — Other Ambulatory Visit: Payer: Self-pay

## 2019-07-27 ENCOUNTER — Ambulatory Visit (INDEPENDENT_AMBULATORY_CARE_PROVIDER_SITE_OTHER): Payer: 59 | Admitting: Internal Medicine

## 2019-07-27 VITALS — BP 128/82 | HR 94 | Temp 99.6°F | Ht <= 58 in | Wt 275.4 lb

## 2019-07-27 DIAGNOSIS — Z0000 Encounter for general adult medical examination without abnormal findings: Secondary | ICD-10-CM

## 2019-07-27 DIAGNOSIS — E559 Vitamin D deficiency, unspecified: Secondary | ICD-10-CM

## 2019-07-27 DIAGNOSIS — E538 Deficiency of other specified B group vitamins: Secondary | ICD-10-CM

## 2019-07-27 DIAGNOSIS — E611 Iron deficiency: Secondary | ICD-10-CM

## 2019-07-27 DIAGNOSIS — E119 Type 2 diabetes mellitus without complications: Secondary | ICD-10-CM

## 2019-07-27 DIAGNOSIS — Z794 Long term (current) use of insulin: Secondary | ICD-10-CM

## 2019-07-27 LAB — PSA: PSA: 0.42 ng/mL (ref 0.10–4.00)

## 2019-07-27 LAB — BASIC METABOLIC PANEL
BUN: 10 mg/dL (ref 6–23)
CO2: 32 mEq/L (ref 19–32)
Calcium: 9.1 mg/dL (ref 8.4–10.5)
Chloride: 96 mEq/L (ref 96–112)
Creatinine, Ser: 0.88 mg/dL (ref 0.40–1.50)
GFR: 111.52 mL/min (ref >60.00)
Glucose, Bld: 366 mg/dL — ABNORMAL HIGH (ref 70–99)
Potassium: 4.2 mEq/L (ref 3.5–5.1)
Sodium: 135 mEq/L (ref 135–145)

## 2019-07-27 LAB — URINALYSIS, ROUTINE W REFLEX MICROSCOPIC
Bilirubin Urine: NEGATIVE
Hgb urine dipstick: NEGATIVE
Ketones, ur: NEGATIVE
Leukocytes,Ua: NEGATIVE
Nitrite: NEGATIVE
RBC / HPF: NONE SEEN (ref 0–?)
Specific Gravity, Urine: 1.01 (ref 1.000–1.030)
Total Protein, Urine: NEGATIVE
Urine Glucose: >=1000 — AB
Urobilinogen, UA: 0.2 (ref 0.0–1.0)
pH: 7 (ref 5.0–8.0)

## 2019-07-27 LAB — MICROALBUMIN / CREATININE URINE RATIO
Creatinine,U: 31.3 mg/dL
Microalb Creat Ratio: 2.2 mg/g (ref 0.0–30.0)
Microalb, Ur: <0.7 mg/dL (ref 0.0–1.9)

## 2019-07-27 LAB — LIPID PANEL
Cholesterol: 173 mg/dL (ref 0–200)
HDL: 31 mg/dL — ABNORMAL LOW (ref >39.00)
Total CHOL/HDL Ratio: 6
Triglycerides: 485 mg/dL — ABNORMAL HIGH (ref 0.0–149.0)

## 2019-07-27 LAB — CBC WITH DIFFERENTIAL/PLATELET
Basophils Absolute: 0.1 10*3/uL (ref 0.0–0.1)
Basophils Relative: 1 % (ref 0.0–3.0)
Eosinophils Absolute: 0 10*3/uL (ref 0.0–0.7)
Eosinophils Relative: 0.5 % (ref 0.0–5.0)
HCT: 42 % (ref 39.0–52.0)
Hemoglobin: 14.6 g/dL (ref 13.0–17.0)
Lymphocytes Relative: 29 % (ref 12.0–46.0)
Lymphs Abs: 1.5 10*3/uL (ref 0.7–4.0)
MCHC: 34.9 g/dL (ref 30.0–36.0)
MCV: 85.6 fl (ref 78.0–100.0)
Monocytes Absolute: 0.4 10*3/uL (ref 0.1–1.0)
Monocytes Relative: 8.5 % (ref 3.0–12.0)
Neutro Abs: 3.2 10*3/uL (ref 1.4–7.7)
Neutrophils Relative %: 61 % (ref 43.0–77.0)
Platelets: 150 10*3/uL (ref 150.0–400.0)
RBC: 4.9 Mil/uL (ref 4.22–5.81)
RDW: 13.8 % (ref 11.5–15.5)
WBC: 5.2 10*3/uL (ref 4.0–10.5)

## 2019-07-27 LAB — HEPATIC FUNCTION PANEL
ALT: 28 U/L (ref 0–53)
AST: 21 U/L (ref 0–37)
Albumin: 4.3 g/dL (ref 3.5–5.2)
Alkaline Phosphatase: 85 U/L (ref 39–117)
Bilirubin, Direct: 0.1 mg/dL (ref 0.0–0.3)
Total Bilirubin: 0.7 mg/dL (ref 0.2–1.2)
Total Protein: 7.3 g/dL (ref 6.0–8.3)

## 2019-07-27 LAB — IBC PANEL
Iron: 27 ug/dL — ABNORMAL LOW (ref 42–165)
Saturation Ratios: 7.1 % — ABNORMAL LOW (ref 20.0–50.0)
Transferrin: 271 mg/dL (ref 212.0–360.0)

## 2019-07-27 LAB — VITAMIN D 25 HYDROXY (VIT D DEFICIENCY, FRACTURES): VITD: 23.78 ng/mL — ABNORMAL LOW (ref 30.00–100.00)

## 2019-07-27 LAB — VITAMIN B12: Vitamin B-12: 327 pg/mL (ref 211–911)

## 2019-07-27 LAB — TSH: TSH: 1.69 u[IU]/mL (ref 0.35–4.50)

## 2019-07-27 LAB — LDL CHOLESTEROL, DIRECT: Direct LDL: 54 mg/dL

## 2019-07-27 MED ORDER — METFORMIN HCL ER 500 MG PO TB24: 1000.0000 mg | ORAL_TABLET | Freq: Every day | ORAL | 3 refills | Status: DC

## 2019-07-27 MED ORDER — GLIPIZIDE ER 10 MG PO TB24: 10.0000 mg | ORAL_TABLET | Freq: Every day | ORAL | 3 refills | Status: DC

## 2019-07-27 MED ORDER — GABAPENTIN 300 MG PO CAPS: ORAL_CAPSULE | ORAL | 1 refills | Status: DC

## 2019-07-27 MED ORDER — PIOGLITAZONE HCL 45 MG PO TABS: 45.0000 mg | ORAL_TABLET | Freq: Every day | ORAL | 3 refills | Status: DC

## 2019-07-27 MED ORDER — ROSUVASTATIN CALCIUM 40 MG PO TABS: 40.0000 mg | ORAL_TABLET | Freq: Every day | ORAL | 3 refills | Status: DC

## 2019-07-27 MED ORDER — LOSARTAN POTASSIUM 25 MG PO TABS: 25.0000 mg | ORAL_TABLET | Freq: Every day | ORAL | 3 refills | Status: DC

## 2019-07-27 NOTE — Progress Notes (Signed)
Subjective:    Patient ID: Dylan [name], male    DOB: 1971-04-09, 49 y.o.   MRN: [id number]  HPI  Here for wellness and f/u;  Overall doing ok;  Pt denies Chest pain, worsening SOB, DOE, wheezing, orthopnea, PND, worsening LE edema, palpitations, dizziness or syncope.  Pt denies neurological change such as new headache, facial or extremity weakness.  Pt denies polydipsia, polyuria, or low sugar symptoms. Pt states overall good compliance with treatment and medications, good tolerability, and has been trying to follow appropriate diet.  Pt denies worsening depressive symptoms, suicidal ideation or panic. No fever, night sweats, wt loss, loss of appetite, or other constitutional symptoms.  Pt states good ability with ADL's, has low fall risk, home safety reviewed and adequate, no other significant changes in hearing or vision, and only occasionally active with exercise.  Out of glipizide ER 10 mg for 1 mo, and metformin for several months, wasn't sure he was supposed to take all of his meds. Had second covid shot yesterday Past Medical History:  Diagnosis Date  . ALLERGIC RHINITIS 11/25/2007  . ASTHMA 11/25/2007  . Asthma   . CARPAL TUNNEL SYNDROME, LEFT, MILD 11/25/2007  . DIABETES MELLITUS, TYPE II 11/25/2007  . Femur fracture, left ([place]) 04/07/2017  . HYPERLIPIDEMIA 11/25/2007  . Metabolic syndrome 67/89/3810  . NECK PAIN 08/27/2009  . Obesity   . Other specified forms of hearing loss 05/10/2009  . Other symptoms referable to lower leg joint 11/25/2007  . SHOULDER PAIN, LEFT 08/27/2009  . Unspecified hearing loss 04/25/2008  . Vitamin D deficiency 04/09/2017   Past Surgical History:  Procedure Laterality Date  . BACK SURGERY    . FEMUR IM NAIL Left 04/08/2017   Procedure: INTRAMEDULLARY (IM) RETROGRADE FEMORAL NAILING;  Surgeon: Altamese Laguna Niguel, MD;  Location: [facility];  Service: Orthopedics;  Laterality: Left;  . s/p back surgury  2002   Lumbar disc    reports that he has never smoked. He  has never used smokeless tobacco. He reports that he does not drink alcohol or use drugs. family history includes Diabetes in an other family member. Allergies  Allergen Reactions  . Penicillins Other (See Comments)    From childhood: Has patient had a PCN reaction causing immediate rash, facial/tongue/throat swelling, SOB or lightheadedness with hypotension: Unk Has patient had a PCN reaction causing severe rash involving mucus membranes or skin necrosis: Unk Has patient had a PCN reaction that required hospitalization: Unk Has patient had a PCN reaction occurring within the last 10 years: No If all of the above answers are "NO", then may proceed with Cephalosporin use.    Current Outpatient Medications on File Prior to Visit  Medication Sig Dispense Refill  . cholecalciferol 5000 units TABS Take 0.4 tablets (2,000 Units total) by mouth daily. 30 tablet 2  . Lancets Misc. MISC Use as directed twice daily 200 each 11  . acetaminophen (TYLENOL) 325 MG tablet Take 650 mg by mouth every 6 (six) hours as needed for moderate pain.     No current facility-administered medications on file prior to visit.   Review of Systems All otherwise neg per pt     Objective:   Physical Exam BP 128/82   Pulse 94   Temp 99.6 F (37.6 C)   Ht 1' (0.305 m)   Wt 275 lb 6.4 oz (124.9 kg)   SpO2 99%   BMI 1344.63 kg/m  VS noted,  Constitutional: Pt appears in NAD HENT: Head: NCAT.  Right Ear: External ear normal.  Left Ear: External ear normal.  Eyes: . Pupils are equal, round, and reactive to light. Conjunctivae and EOM are normal Nose: without d/c or deformity Neck: Neck supple. Gross normal ROM Cardiovascular: Normal rate and regular rhythm.   Pulmonary/Chest: Effort normal and breath sounds without rales or wheezing.  Abd:  Soft, NT, ND, + BS, no organomegaly Neurological: Pt is alert. At baseline orientation, motor grossly intact Skin: Skin is warm. No rashes, other new lesions, no LE  edema Psychiatric: Pt behavior is normal without agitation  All otherwise neg per pt      Assessment & Plan:

## 2019-07-27 NOTE — Addendum Note (Signed)
Addended by: Miguel Aschoff on: 07/27/2019 05:15 PM   Modules accepted: Orders

## 2019-07-27 NOTE — Patient Instructions (Signed)

## 2019-07-28 ENCOUNTER — Other Ambulatory Visit: Payer: Self-pay | Admitting: Internal Medicine

## 2019-07-28 ENCOUNTER — Encounter: Payer: Self-pay | Admitting: Internal Medicine

## 2019-07-28 LAB — HEMOGLOBIN A1C
Hgb A1c MFr Bld: 13 % of total Hgb — ABNORMAL HIGH (ref <5.7)
Mean Plasma Glucose: 326 (calc)
eAG (mmol/L): 18.1 (calc)

## 2019-07-28 NOTE — Assessment & Plan Note (Signed)
Likely uncontrolled, for a1c, restart all med

## 2019-07-28 NOTE — Assessment & Plan Note (Signed)

## 2019-07-28 NOTE — Assessment & Plan Note (Signed)
For oral replacement 

## 2019-08-01 ENCOUNTER — Other Ambulatory Visit: Payer: Self-pay | Admitting: Internal Medicine

## 2019-08-01 DIAGNOSIS — D509 Iron deficiency anemia, unspecified: Secondary | ICD-10-CM

## 2019-08-01 DIAGNOSIS — E119 Type 2 diabetes mellitus without complications: Secondary | ICD-10-CM

## 2019-08-01 MED ORDER — POLYSACCHARIDE IRON COMPLEX 150 MG PO CAPS: 150.0000 mg | ORAL_CAPSULE | Freq: Every day | ORAL | 1 refills | Status: DC

## 2019-08-01 MED ORDER — FENOFIBRATE 160 MG PO TABS: 160.0000 mg | ORAL_TABLET | Freq: Every day | ORAL | 3 refills | Status: DC

## 2019-08-01 MED ORDER — VITAMIN D (ERGOCALCIFEROL) 1.25 MG (50000 UNIT) PO CAPS: 50000.0000 [IU] | ORAL_CAPSULE | ORAL | 0 refills | Status: DC

## 2019-08-01 NOTE — Telephone Encounter (Signed)
The test results show that your current treatment is OK, as the tests are stable except for several things.The vitamin D level is low, the iron level is low, the triglycerides are quite high, and the A1c is now slightly over 13.  Remember we found that you were not taking all of your diabetic medications, but I am not sure the current treatment is going to be able to get you under good control.    We need to: 1)  Please take Vitamin D 84784 units weekly for 12 weeks, then plan to change to OTC Vitamin D3 at 2000 units per day, indefinitely. 2)  Start Nu-iron 1 per day and refer to gastroenterology to see if you have a reason for low iron such as bleeding and not know it. 3)  Start fenofibrate 160 mg per day for high triglycerides and follow a low fat diet. 4)  Make sure you take all of your medications for sugar, and we should refer to Diabetes Education to learn more about diabetes and diet, as well refer to Endocrinology (diabetic doctors) to get better control and avoid complications such as heart or nerve damage in the future.

## 2019-08-30 ENCOUNTER — Encounter: Payer: Self-pay | Admitting: Internal Medicine

## 2019-08-31 ENCOUNTER — Ambulatory Visit: Payer: 59 | Admitting: Endocrinology

## 2019-09-21 ENCOUNTER — Other Ambulatory Visit: Payer: Self-pay

## 2019-09-26 ENCOUNTER — Other Ambulatory Visit: Payer: Self-pay

## 2019-09-26 ENCOUNTER — Encounter: Payer: Self-pay | Admitting: Endocrinology

## 2019-09-26 ENCOUNTER — Ambulatory Visit (INDEPENDENT_AMBULATORY_CARE_PROVIDER_SITE_OTHER): Payer: 59 | Admitting: Endocrinology

## 2019-09-26 VITALS — BP 120/84 | HR 89 | Ht 72.0 in | Wt 281.0 lb

## 2019-09-26 DIAGNOSIS — E119 Type 2 diabetes mellitus without complications: Secondary | ICD-10-CM | POA: Diagnosis not present

## 2019-09-26 LAB — POCT GLYCOSYLATED HEMOGLOBIN (HGB A1C): Hemoglobin A1C: 10.4 % — AB (ref 4.0–5.6)

## 2019-09-26 MED ORDER — GLIPIZIDE 10 MG PO TABS: 10.0000 mg | ORAL_TABLET | Freq: Every day | ORAL | 3 refills | Status: DC

## 2019-09-26 MED ORDER — ACCU-CHEK GUIDE VI STRP: 1.0000 | ORAL_STRIP | Freq: Every day | 3 refills | Status: DC

## 2019-09-26 MED ORDER — GLIPIZIDE 10 MG PO TABS
10.0000 mg | ORAL_TABLET | Freq: Every day | ORAL | 3 refills | Status: DC
Start: 2019-09-26 — End: 2019-09-26

## 2019-09-26 MED ORDER — METFORMIN HCL 1000 MG PO TABS
1000.0000 mg | ORAL_TABLET | Freq: Two times a day (BID) | ORAL | 3 refills | Status: DC
Start: 2019-09-26 — End: 2019-10-02

## 2019-09-26 MED ORDER — PIOGLITAZONE HCL 45 MG PO TABS: 45.0000 mg | ORAL_TABLET | Freq: Every day | ORAL | 3 refills | Status: DC

## 2019-09-26 MED ORDER — RYBELSUS 3 MG PO TABS: 3.0000 mg | ORAL_TABLET | Freq: Every day | ORAL | 3 refills | Status: DC

## 2019-09-26 NOTE — Patient Instructions (Addendum)
good diet and exercise significantly improve the control of your diabetes.  please let me know if you wish to be referred to a dietician.  high blood sugar is very risky to your health.  you should see an eye doctor and dentist every year.  It is very important to get all recommended vaccinations.  Controlling your blood pressure and cholesterol drastically reduces the damage diabetes does to your body.  Those who smoke should quit.  Please discuss these with your doctor.  check your blood sugar once a day.  vary the time of day when you check, between before the 3 meals, and at bedtime.  also check if you have symptoms of your blood sugar being too high or too low.  please keep a record of the readings and bring it to your next appointment here (or you can bring the meter itself).  You can write it on any piece of paper.  please call us sooner if your blood sugar goes below 70, or if you have a lot of readings over 200.  I have sent a prescription to your pharmacy, to add "Rybelsus." Also, I have sent a prescription to your pharmacy, to change your meds so that you can crush then.  You can put the power in anything or on anything. Please come back for a follow-up appointment in 1 month.

## 2019-09-26 NOTE — Progress Notes (Signed)
Subjective:    Patient ID: Dylan [name], male    DOB: 06/03/1970, 49 y.o.   MRN: [id number]  HPI pt is referred by Dr Jonny Ruiz, for diabetes.  Pt states DM was dx'ed in 2008; he is unaware of any chronic complications; he has never been on insulin; pt says his diet and exercise are good; he has never had pancreatitis, pancreatic surgery, severe hypoglycemia or DKA.  He takes 3 oral meds.  Pt says at the time of the 07/27/19 A1c, he was off meds.  Since back on meds (which he takes inconsistently), cbg's vary from 170-220.  He works 1PM-3AM.  Pt says "I gag when I take pills." Past Medical History:  Diagnosis Date  . ALLERGIC RHINITIS 11/25/2007  . ASTHMA 11/25/2007  . Asthma   . CARPAL TUNNEL SYNDROME, LEFT, MILD 11/25/2007  . DIABETES MELLITUS, TYPE II 11/25/2007  . Femur fracture, left (HCC) 04/07/2017  . HYPERLIPIDEMIA 11/25/2007  . Metabolic syndrome 04/09/2017  . NECK PAIN 08/27/2009  . Obesity   . Other specified forms of hearing loss 05/10/2009  . Other symptoms referable to lower leg joint 11/25/2007  . SHOULDER PAIN, LEFT 08/27/2009  . Unspecified hearing loss 04/25/2008  . Vitamin D deficiency 04/09/2017    Past Surgical History:  Procedure Laterality Date  . BACK SURGERY    . FEMUR IM NAIL Left 04/08/2017   Procedure: INTRAMEDULLARY (IM) RETROGRADE FEMORAL NAILING;  Surgeon: Myrene Galas, MD;  Location: [facility] OR;  Service: Orthopedics;  Laterality: Left;  . s/p back surgury  2002   Lumbar disc    Social History   Socioeconomic History  . Marital status: Married    Spouse name: Not on file  . Number of children: 3  . Years of education: Not on file  . Highest education level: Not on file  Occupational History  . 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 Smoking status: Never Smoker  . Smokeless tobacco: Never Used  Substance and Sexual Activity  . Alcohol use: No  . Drug use: No  . Sexual activity: Not on file  Other Topics  Concern  . Not on file  Social History Narrative  . Not on file   Social Determinants of Health   Financial Resource Strain:   . Difficulty of Paying Living Expenses:   Food Insecurity:   . Worried About Programme researcher, broadcasting/film/video in the Last Year:   . Barista in the Last Year:   Transportation Needs:   . Freight forwarder (Medical):   Marland Kitchen Lack of Transportation (Non-Medical):   Physical Activity:   . Days of Exercise per Week:   . Minutes of Exercise per Session:   Stress:   . Feeling of Stress :   Social Connections:   . Frequency of Communication with Friends and Family:   . Frequency of Social Gatherings with Friends and Family:   . Attends Religious Services:   . Active Member of Clubs or Organizations:   . Attends Banker Meetings:   Marland Kitchen Marital Status:   Intimate Partner Violence:   . Fear of Current or Ex-Partner:   . Emotionally Abused:   Marland Kitchen Physically Abused:   . Sexually Abused:     Current Outpatient Medications on File Prior to Visit  Medication Sig Dispense Refill  . acetaminophen (TYLENOL) 325 MG tablet Take 650 mg by mouth every 6 (six) hours as needed for moderate  pain.    . cholecalciferol 5000 units TABS Take 0.4 tablets (2,000 Units total) by mouth daily. 30 tablet 2  . fenofibrate 160 MG tablet Take 1 tablet (160 mg total) by mouth daily. 90 tablet 3  . gabapentin (NEURONTIN) 300 MG capsule 1 tab by mouth three times daily 270 capsule 1  . iron polysaccharides (NU-IRON) 150 MG capsule Take 1 capsule (150 mg total) by mouth daily. 90 capsule 1  . Lancets Misc. MISC Use as directed twice daily 200 each 11  . losartan (COZAAR) 25 MG tablet Take 1 tablet (25 mg total) by mouth daily. 90 tablet 3  . rosuvastatin (CRESTOR) 40 MG tablet Take 1 tablet (40 mg total) by mouth daily. 90 tablet 3   No current facility-administered medications on file prior to visit.    Allergies  Allergen Reactions  . Penicillins Other (See Comments)    From  childhood: Has patient had a PCN reaction causing immediate rash, facial/tongue/throat swelling, SOB or lightheadedness with hypotension: Unk Has patient had a PCN reaction causing severe rash involving mucus membranes or skin necrosis: Unk Has patient had a PCN reaction that required hospitalization: Unk Has patient had a PCN reaction occurring within the last 10 years: No If all of the above answers are "NO", then may proceed with Cephalosporin use.     Family History  Problem Relation Age of Onset  . Diabetes Other     BP 120/84   Pulse 89   Ht 6' (1.829 m)   Wt 281 lb (127.5 kg)   SpO2 94%   BMI 38.11 kg/m     Review of Systems denies weight loss, blurry vision, chest pain, sob, n/v, urinary frequency, and depression.      Objective:   Physical Exam VS: see vs page GEN: no distress HEAD: head: no deformity eyes: no periorbital swelling, no proptosis.   external nose and ears are normal.   NECK: supple, thyroid is not enlarged.   CHEST WALL: no deformity BREASTS: bilat pseudogynecomastia.   LUNGS: clear to auscultation CV: reg rate and rhythm, no murmur MUSCULOSKELETAL: muscle bulk and strength are grossly normal.  no obvious joint swelling.  gait is normal and steady.   EXTEMITIES: no deformity.  no ulcer on the feet, but the skin is scaly.  feet are of normal color and temp.  no leg edema.  there is bilateral onychomycosis of the toenails PULSES: dorsalis pedis intact bilat.  no carotid bruit NEURO:  cn 2-12 grossly intact.   readily moves all 4's.  sensation is intact to touch on the feet.   SKIN:  Normal texture and temperature.  No rash or suspicious lesion is visible.   NODES:  None palpable at the neck PSYCH: alert, well-oriented.  Does not appear anxious nor depressed.   Lab Results  Component Value Date   HGBA1C 10.4 (A) 09/26/2019   Lab Results  Component Value Date   CREATININE 0.88 07/27/2019   BUN 10 07/27/2019   NA 135 07/27/2019   K 4.2  07/27/2019   CL 96 07/27/2019   CO2 32 07/27/2019   I have reviewed outside records, and summarized: Pt was noted to have elevated A1c, and referred here. wellness was also addressed  I personally reviewed electrocardiogram tracing (04/08/17): Indication: femoral fx.  Impression: NSR.  NS-TWA Compared to 2013: no significant change     Assessment & Plan:  Type 2 DM: severe exacerbation. He may not be controllable without insulin.   Noncompliance  with medications, new to me.    Patient Instructions  good diet and exercise significantly improve the control of your diabetes.  please let me know if you wish to be referred to a dietician.  high blood sugar is very risky to your health.  you should see an eye doctor and dentist every year.  It is very important to get all recommended vaccinations.  Controlling your blood pressure and cholesterol drastically reduces the damage diabetes does to your body.  Those who smoke should quit.  Please discuss these with your doctor.  check your blood sugar once a day.  vary the time of day when you check, between before the 3 meals, and at bedtime.  also check if you have symptoms of your blood sugar being too high or too low.  please keep a record of the readings and bring it to your next appointment here (or you can bring the meter itself).  You can write it on any piece of paper.  please call us sooner if your blood sugar goes below 70, or if you have a lot of readings over 200.  I have sent a prescription to your pharmacy, to add "Rybelsus." Also, I have sent a prescription to your pharmacy, to change your meds so that you can crush then.  You can put the power in anything or on anything. Please come back for a follow-up appointment in 1 month.

## 2019-10-02 ENCOUNTER — Other Ambulatory Visit: Payer: Self-pay

## 2019-10-02 ENCOUNTER — Telehealth: Payer: Self-pay | Admitting: Endocrinology

## 2019-10-02 DIAGNOSIS — E119 Type 2 diabetes mellitus without complications: Secondary | ICD-10-CM

## 2019-10-02 MED ORDER — METFORMIN HCL 1000 MG PO TABS: 1000.0000 mg | ORAL_TABLET | Freq: Two times a day (BID) | ORAL | 3 refills | Status: DC

## 2019-10-02 MED ORDER — GLIPIZIDE 10 MG PO TABS: 10.0000 mg | ORAL_TABLET | Freq: Every day | ORAL | 3 refills | Status: DC

## 2019-10-02 MED ORDER — PIOGLITAZONE HCL 45 MG PO TABS: 45.0000 mg | ORAL_TABLET | Freq: Every day | ORAL | 3 refills | Status: DC

## 2019-10-02 MED ORDER — RYBELSUS 3 MG PO TABS: 3.0000 mg | ORAL_TABLET | Freq: Every day | ORAL | 3 refills | Status: DC

## 2019-10-02 NOTE — Telephone Encounter (Signed)
Patient called to request the following 4 RX's be sent to the [facility] listed below due to [facility] will not do 90 day RX's:  Medication RX Request  Did you call your pharmacy and request this refill first?Yes  If patient has not contacted pharmacy first, instruct them to do so for future refills.   Remind them that contacting the pharmacy for their refill is the quickest method to get the refill.   Refill policy also stated that it will take anywhere between 24-72 hours to receive the refill.    Name of medication?  metFORMIN (GLUCOPHAGE) 1000 MG tablet  glipiZIDE (GLUCOTROL) 10 MG tablet  Semaglutide (RYBELSUS) 3 MG TABS  AND   pioglitazone (ACTOS) 45 MG tablet  Is this a 90 day supply? Yes -All 4 medications listed above  Name and location of pharmacy?  [id number] [place], [place] - [address] Phone:  [phone]  Fax:  [phone]       Is the request for diabetes test strips? No  If yes, what brand? N/A

## 2019-10-02 NOTE — Telephone Encounter (Signed)
[facility] on [address]

## 2019-10-03 ENCOUNTER — Telehealth: Payer: Self-pay

## 2019-10-03 NOTE — Telephone Encounter (Signed)
PA is required for Rybelsus 3mg  tabs. Would you like to complete PA or change medication?

## 2019-10-04 NOTE — Telephone Encounter (Signed)
APPROVAL  Medication: Rybelsus 3mg  Insurance Company: [employer] PA response: APPROVED UNABLE TO BE FILLED UNTIL 10/21/19  Document has been labeled and placed in scan file for HIM and for our future reference.  PRIOR AUTHORIZATION  PA initiation date: 10/04/19  Medication: Rybelsus 3mg  Insurance Company: [facility] 12/04/19 Submission completed electronically through My Meds: Yes PA form completed and faxed to [employer]: Yes  Will await insurance response re: approval/denial.

## 2019-10-21 ENCOUNTER — Other Ambulatory Visit: Payer: Self-pay | Admitting: Internal Medicine

## 2019-10-21 NOTE — Telephone Encounter (Signed)
Please change to OTC Vitamin D3 at 2000 units per day, indefinitely.  

## 2019-10-26 ENCOUNTER — Ambulatory Visit: Payer: 59 | Admitting: Endocrinology

## 2020-09-25 ENCOUNTER — Other Ambulatory Visit: Payer: Self-pay | Admitting: Internal Medicine

## 2020-09-25 DIAGNOSIS — E119 Type 2 diabetes mellitus without complications: Secondary | ICD-10-CM

## 2020-09-25 NOTE — Telephone Encounter (Signed)
Please refill as per office routine med refill policy (all routine meds refilled for 3 mo or monthly per pt preference up to one year from last visit, then month to month grace period for 3 mo, then further med refills will have to be denied)  

## 2020-09-28 NOTE — Telephone Encounter (Signed)
Ok I refilled 1 mo only actos  Please ask pt - needs ROV for further refills

## 2020-09-30 NOTE — Telephone Encounter (Signed)
Patient scheduled for 10/04/20

## 2020-10-03 ENCOUNTER — Other Ambulatory Visit: Payer: Self-pay

## 2020-10-03 ENCOUNTER — Other Ambulatory Visit: Payer: Self-pay | Admitting: Internal Medicine

## 2020-10-03 ENCOUNTER — Other Ambulatory Visit (INDEPENDENT_AMBULATORY_CARE_PROVIDER_SITE_OTHER): Payer: 59

## 2020-10-03 ENCOUNTER — Other Ambulatory Visit: Payer: 59

## 2020-10-03 DIAGNOSIS — E119 Type 2 diabetes mellitus without complications: Secondary | ICD-10-CM

## 2020-10-03 DIAGNOSIS — E559 Vitamin D deficiency, unspecified: Secondary | ICD-10-CM | POA: Diagnosis not present

## 2020-10-03 DIAGNOSIS — Z125 Encounter for screening for malignant neoplasm of prostate: Secondary | ICD-10-CM | POA: Diagnosis not present

## 2020-10-03 DIAGNOSIS — Z0000 Encounter for general adult medical examination without abnormal findings: Secondary | ICD-10-CM

## 2020-10-03 DIAGNOSIS — E538 Deficiency of other specified B group vitamins: Secondary | ICD-10-CM

## 2020-10-03 LAB — URINALYSIS, ROUTINE W REFLEX MICROSCOPIC
Bilirubin Urine: NEGATIVE
Ketones, ur: NEGATIVE
Nitrite: NEGATIVE
Specific Gravity, Urine: 1.01 (ref 1.000–1.030)
Total Protein, Urine: NEGATIVE
Urine Glucose: >=1000 — AB
Urobilinogen, UA: 0.2 (ref 0.0–1.0)
pH: 6 (ref 5.0–8.0)

## 2020-10-03 LAB — HEPATIC FUNCTION PANEL
ALT: 25 U/L (ref 0–53)
AST: 16 U/L (ref 0–37)
Albumin: 4 g/dL (ref 3.5–5.2)
Alkaline Phosphatase: 66 U/L (ref 39–117)
Bilirubin, Direct: 0.1 mg/dL (ref 0.0–0.3)
Total Bilirubin: 0.6 mg/dL (ref 0.2–1.2)
Total Protein: 7 g/dL (ref 6.0–8.3)

## 2020-10-03 LAB — BASIC METABOLIC PANEL
BUN: 10 mg/dL (ref 6–23)
CO2: 26 mEq/L (ref 19–32)
Calcium: 8.9 mg/dL (ref 8.4–10.5)
Chloride: 99 mEq/L (ref 96–112)
Creatinine, Ser: 0.88 mg/dL (ref 0.40–1.50)
GFR: 100.71 mL/min (ref >60.00)
Glucose, Bld: 314 mg/dL — ABNORMAL HIGH (ref 70–99)
Potassium: 4 mEq/L (ref 3.5–5.1)
Sodium: 136 mEq/L (ref 135–145)

## 2020-10-03 LAB — VITAMIN D 25 HYDROXY (VIT D DEFICIENCY, FRACTURES): VITD: 16.71 ng/mL — ABNORMAL LOW (ref 30.00–100.00)

## 2020-10-03 LAB — CBC WITH DIFFERENTIAL/PLATELET
Basophils Absolute: 0 10*3/uL (ref 0.0–0.1)
Basophils Relative: 0.5 % (ref 0.0–3.0)
Eosinophils Absolute: 0.1 10*3/uL (ref 0.0–0.7)
Eosinophils Relative: 1.1 % (ref 0.0–5.0)
HCT: 41.5 % (ref 39.0–52.0)
Hemoglobin: 14.3 g/dL (ref 13.0–17.0)
Lymphocytes Relative: 45.9 % (ref 12.0–46.0)
Lymphs Abs: 2.6 10*3/uL (ref 0.7–4.0)
MCHC: 34.5 g/dL (ref 30.0–36.0)
MCV: 85.1 fl (ref 78.0–100.0)
Monocytes Absolute: 0.3 10*3/uL (ref 0.1–1.0)
Monocytes Relative: 5.6 % (ref 3.0–12.0)
Neutro Abs: 2.7 10*3/uL (ref 1.4–7.7)
Neutrophils Relative %: 46.9 % (ref 43.0–77.0)
Platelets: 169 10*3/uL (ref 150.0–400.0)
RBC: 4.87 Mil/uL (ref 4.22–5.81)
RDW: 13.5 % (ref 11.5–15.5)
WBC: 5.8 10*3/uL (ref 4.0–10.5)

## 2020-10-03 LAB — TSH: TSH: 1.61 u[IU]/mL (ref 0.35–4.50)

## 2020-10-03 LAB — LIPID PANEL
Cholesterol: 206 mg/dL — ABNORMAL HIGH (ref 0–200)
HDL: 32 mg/dL — ABNORMAL LOW (ref >39.00)
NonHDL: 174.48
Total CHOL/HDL Ratio: 6
Triglycerides: 372 mg/dL — ABNORMAL HIGH (ref 0.0–149.0)
VLDL: 74.4 mg/dL — ABNORMAL HIGH (ref 0.0–40.0)

## 2020-10-03 LAB — VITAMIN B12: Vitamin B-12: 326 pg/mL (ref 211–911)

## 2020-10-03 LAB — LDL CHOLESTEROL, DIRECT: Direct LDL: 104 mg/dL

## 2020-10-03 LAB — MICROALBUMIN / CREATININE URINE RATIO
Creatinine,U: 61.2 mg/dL
Microalb Creat Ratio: 1.1 mg/g (ref 0.0–30.0)
Microalb, Ur: <0.7 mg/dL (ref 0.0–1.9)

## 2020-10-03 LAB — PSA: PSA: 0.45 ng/mL (ref 0.10–4.00)

## 2020-10-03 NOTE — Addendum Note (Signed)
Addended by: Elita Boone E on: 10/03/2020 04:55 PM   Modules accepted: Orders

## 2020-10-04 ENCOUNTER — Encounter: Payer: Self-pay | Admitting: Internal Medicine

## 2020-10-04 ENCOUNTER — Ambulatory Visit (INDEPENDENT_AMBULATORY_CARE_PROVIDER_SITE_OTHER): Payer: 59 | Admitting: Internal Medicine

## 2020-10-04 VITALS — BP 132/88 | HR 87 | Temp 98.2°F | Ht 72.0 in | Wt 268.8 lb

## 2020-10-04 DIAGNOSIS — E559 Vitamin D deficiency, unspecified: Secondary | ICD-10-CM

## 2020-10-04 DIAGNOSIS — Z1159 Encounter for screening for other viral diseases: Secondary | ICD-10-CM | POA: Diagnosis not present

## 2020-10-04 DIAGNOSIS — E785 Hyperlipidemia, unspecified: Secondary | ICD-10-CM | POA: Diagnosis not present

## 2020-10-04 DIAGNOSIS — Z0001 Encounter for general adult medical examination with abnormal findings: Secondary | ICD-10-CM | POA: Diagnosis not present

## 2020-10-04 DIAGNOSIS — E1165 Type 2 diabetes mellitus with hyperglycemia: Secondary | ICD-10-CM

## 2020-10-04 DIAGNOSIS — I10 Essential (primary) hypertension: Secondary | ICD-10-CM | POA: Diagnosis not present

## 2020-10-04 DIAGNOSIS — E119 Type 2 diabetes mellitus without complications: Secondary | ICD-10-CM

## 2020-10-04 LAB — HEMOGLOBIN A1C
Hgb A1c MFr Bld: 13.4 % of total Hgb — ABNORMAL HIGH (ref <5.7)
Mean Plasma Glucose: 338 mg/dL
eAG (mmol/L): 18.7 mmol/L

## 2020-10-04 MED ORDER — FENOFIBRATE 160 MG PO TABS: 160.0000 mg | ORAL_TABLET | Freq: Every day | ORAL | 3 refills | Status: DC

## 2020-10-04 MED ORDER — RYBELSUS 3 MG PO TABS: 3.0000 mg | ORAL_TABLET | Freq: Every day | ORAL | 3 refills | Status: DC

## 2020-10-04 MED ORDER — ROSUVASTATIN CALCIUM 40 MG PO TABS: 40.0000 mg | ORAL_TABLET | Freq: Every day | ORAL | 3 refills | Status: DC

## 2020-10-04 MED ORDER — PIOGLITAZONE HCL 45 MG PO TABS: 45.0000 mg | ORAL_TABLET | Freq: Every day | ORAL | 3 refills | Status: DC

## 2020-10-04 MED ORDER — GLIPIZIDE ER 10 MG PO TB24: 10.0000 mg | ORAL_TABLET | Freq: Every day | ORAL | 3 refills | Status: DC

## 2020-10-04 MED ORDER — FREESTYLE LIBRE 14 DAY SENSOR MISC: 3 refills | Status: DC

## 2020-10-04 MED ORDER — METFORMIN HCL 1000 MG PO TABS
1000.0000 mg | ORAL_TABLET | Freq: Two times a day (BID) | ORAL | 3 refills | Status: DC
Start: 2020-10-04 — End: 2022-01-20

## 2020-10-04 MED ORDER — LOSARTAN POTASSIUM 25 MG PO TABS: 25.0000 mg | ORAL_TABLET | Freq: Every day | ORAL | 3 refills | Status: DC

## 2020-10-04 MED ORDER — FREESTYLE LIBRE 2 READER DEVI: 2 refills | Status: AC

## 2020-10-04 NOTE — Patient Instructions (Addendum)
Please remember to make your yearly eye exam appt, and the colonoscopy as you mentioned  Please take OTC Vitamin D3 at 2000 units per day, indefinitely  Please continue all other medications as before, including restarting all the medications on your list  I will also send the prescription for the [facility] machine and sensors  Please have the pharmacy call with any other refills you may need.  Please continue your efforts at being more active, low cholesterol diet, and weight control.  You are otherwise up to date with prevention measures today.  Please keep your appointments with your specialists as you may have planned - Dr Everardo All for sugar  Please make an Appointment to return in 6 months, or sooner if needed, also with Lab Appointment for testing done 3-5 days before at the FIRST FLOOR Lab (so this is for TWO appointments - please see the scheduling desk as you leave)  Due to the ongoing Covid 19 pandemic, our lab now requires an appointment for any labs done at our office.  If you need labs done and do not have an appointment, please call our office ahead of time to schedule before presenting to the lab for your testing.

## 2020-10-04 NOTE — Progress Notes (Signed)
Chief Complaint:: wellness exam and dm, hld, lov vit d, htn       HPI:  Dylan [name] is a 50 y.o. male here for wellness exam; due for hep c screen, declines referral optho and colonscopy as he plans to call himself for these; o/w up to date with preventive referrals and immunizations                        Also admits to med non compliance - "I just dont like to take pills" for DM, hld, and vit d, but after discussion today seems more willing to start.  Pt denies chest pain, increased sob or doe, wheezing, orthopnea, PND, increased LE swelling, palpitations, dizziness or syncope.   Pt denies polydipsia, polyuria, or new focal neuro s/s.   Pt denies fever, wt loss, night sweats, loss of appetite, or other constitutional symptoms   No other new complaints      Wt Readings from Last 3 Encounters:  10/04/20 268 lb 12.8 oz (121.9 kg)  09/26/19 281 lb (127.5 kg)  07/27/19 275 lb 6.4 oz (124.9 kg)   BP Readings from Last 3 Encounters:  10/04/20 132/88  09/26/19 120/84  07/27/19 128/82   Immunization History  Administered Date(s) Administered   Influenza Split 12/31/2011   Influenza Whole 05/10/2009   Influenza,inj,Quad PF,6+ Mos 02/07/2013, 06/20/2014, 01/15/2017, 01/03/2018   Influenza,inj,quad, With Preservative 01/25/2017   Influenza-Unspecified 07/05/2015   Pneumococcal Polysaccharide-23 04/25/2008, 01/15/2017   Td 04/27/2004   Tdap 06/20/2014   There are no preventive care reminders to display for this patient.     Past Medical History:  Diagnosis Date   ALLERGIC RHINITIS 11/25/2007   ASTHMA 11/25/2007   Asthma    CARPAL TUNNEL SYNDROME, LEFT, MILD 11/25/2007   DIABETES MELLITUS, TYPE II 11/25/2007   Femur fracture, left (HCC) 04/07/2017   HYPERLIPIDEMIA 11/25/2007   Metabolic syndrome 04/09/2017   NECK PAIN 08/27/2009   Obesity    Other specified forms of hearing loss 05/10/2009   Other symptoms referable to lower leg joint 11/25/2007   SHOULDER PAIN, LEFT 08/27/2009    Unspecified hearing loss 04/25/2008   Vitamin D deficiency 04/09/2017   Past Surgical History:  Procedure Laterality Date   BACK SURGERY     FEMUR IM NAIL Left 04/08/2017   Procedure: INTRAMEDULLARY (IM) RETROGRADE FEMORAL NAILING;  Surgeon: Myrene Galas, MD;  Location: [facility] OR;  Service: Orthopedics;  Laterality: Left;   s/p back surgury  2002   Lumbar disc    reports that he has never smoked. He has never used smokeless tobacco. He reports that he does not drink alcohol and does not use drugs. family history includes Diabetes in an other family member. Allergies  Allergen Reactions   Penicillins Other (See Comments)    From childhood: Has patient had a PCN reaction causing immediate rash, facial/tongue/throat swelling, SOB or lightheadedness with hypotension: Unk Has patient had a PCN reaction causing severe rash involving mucus membranes or skin necrosis: Unk Has patient had a PCN reaction that required hospitalization: Unk Has patient had a PCN reaction occurring within the last 10 years: No If all of the above answers are "NO", then may proceed with Cephalosporin use.    Current Outpatient Medications on File Prior to Visit  Medication Sig Dispense Refill   ACCU-CHEK GUIDE test strip 1 each by Other route daily. And lancets 1/day 90 strip 3   acetaminophen (TYLENOL) 325  MG tablet Take 650 mg by mouth every 6 (six) hours as needed for moderate pain.     cholecalciferol 5000 units TABS Take 0.4 tablets (2,000 Units total) by mouth daily. 30 tablet 2   Lancets Misc. MISC Use as directed twice daily 200 each 11   No current facility-administered medications on file prior to visit.        ROS:  All others reviewed and negative.  Objective        PE:  BP 132/88 (BP Location: Left Arm, Patient Position: Sitting, Cuff Size: Large)   Pulse 87   Temp 98.2 F (36.8 C) (Oral)   Ht 6' (1.829 m)   Wt 268 lb 12.8 oz (121.9 kg)   SpO2 99%   BMI 36.46 kg/m                  Constitutional: Pt appears in NAD               HENT: Head: NCAT.                Right Ear: External ear normal.                 Left Ear: External ear normal.                Eyes: . Pupils are equal, round, and reactive to light. Conjunctivae and EOM are normal               Nose: without d/c or deformity               Neck: Neck supple. Gross normal ROM               Cardiovascular: Normal rate and regular rhythm.                 Pulmonary/Chest: Effort normal and breath sounds without rales or wheezing.                Abd:  Soft, NT, ND, + BS, no organomegaly               Neurological: Pt is alert. At baseline orientation, motor grossly intact               Skin: Skin is warm. No rashes, no other new lesions, LE edema - none               Psychiatric: Pt behavior is normal without agitation   Micro: none  Cardiac tracings I have personally interpreted today:  none  Pertinent Radiological findings (summarize): none   Lab Results  Component Value Date   WBC 5.8 10/03/2020   HGB 14.3 10/03/2020   HCT 41.5 10/03/2020   PLT 169.0 10/03/2020   GLUCOSE 314 (H) 10/03/2020   CHOL 206 (H) 10/03/2020   TRIG 372.0 (H) 10/03/2020   HDL 32.00 (L) 10/03/2020   LDLDIRECT 104.0 10/03/2020   LDLCALC 56 12/31/2017   ALT 25 10/03/2020   AST 16 10/03/2020   NA 136 10/03/2020   K 4.0 10/03/2020   CL 99 10/03/2020   CREATININE 0.88 10/03/2020   BUN 10 10/03/2020   CO2 26 10/03/2020   TSH 1.61 10/03/2020   PSA 0.45 10/03/2020   INR 1.03 04/07/2017   HGBA1C 13.4 (H) 10/03/2020   MICROALBUR <0.7 10/03/2020   Assessment/Plan:  Dylan [name] is a 50 y.o. Black or African American [2] male with  has a past medical history of ALLERGIC RHINITIS (  11/25/2007), ASTHMA (11/25/2007), Asthma, CARPAL TUNNEL SYNDROME, LEFT, MILD (11/25/2007), DIABETES MELLITUS, TYPE II (11/25/2007), Femur fracture, left (HCC) (04/07/2017), HYPERLIPIDEMIA (11/25/2007), Metabolic syndrome (04/09/2017), NECK PAIN (08/27/2009),  Obesity, Other specified forms of hearing loss (05/10/2009), Other symptoms referable to lower leg joint (11/25/2007), SHOULDER PAIN, LEFT (08/27/2009), Unspecified hearing loss (04/25/2008), and Vitamin D deficiency (04/09/2017).  Encounter for well adult exam with abnormal findings Age and sex appropriate education and counseling updated with regular exercise and diet Referrals for preventative services - pt states will call for eye exam and colonoscopy himself, due for hep c screen Immunizations addressed - none needed Smoking counseling  - none needed Evidence for depression or other mood disorder - none significant Most recent labs reviewed. I have personally reviewed and have noted: 1) the patient's medical and social history 2) The patient's current medications and supplements 3) The patient's height, weight, and BMI have been recorded in the chart   Hyperlipidemia Uncontrolled likely currently due to not taking meds -  Lab Results  Component Value Date   LDLCALC 56 12/31/2017   Pt to restart statin crestor 40   Essential hypertension BP Readings from Last 3 Encounters:  10/04/20 132/88  09/26/19 120/84  07/27/19 128/82   Stable, pt to continue medical treatment losartan 25   Diabetes (HCC) Severe uncontrolled -  Lab Results  Component Value Date   HGBA1C 13.4 (H) 10/03/2020   Stable, pt to restart current medical treatment glucotrol, metformn, actos, rybelsus and f/u endo - pt states will call to do this   Vitamin D deficiency Last vitamin D Lab Results  Component Value Date   VD25OH 16.71 (L) 10/03/2020   Low uncontrolled, to start oral replacement  Followup: Return in about 6 months (around 04/05/2021).  Oliver Barre, MD 10/06/2020 11:24 AM [facility]

## 2020-10-06 ENCOUNTER — Encounter: Payer: Self-pay | Admitting: Internal Medicine

## 2020-10-06 NOTE — Assessment & Plan Note (Signed)
BP Readings from Last 3 Encounters:  10/04/20 132/88  09/26/19 120/84  07/27/19 128/82   Stable, pt to continue medical treatment losartan 25

## 2020-10-06 NOTE — Assessment & Plan Note (Signed)
Age and sex appropriate education and counseling updated with regular exercise and diet Referrals for preventative services - pt states will call for eye exam and colonoscopy himself, due for hep c screen Immunizations addressed - none needed Smoking counseling  - none needed Evidence for depression or other mood disorder - none significant Most recent labs reviewed. I have personally reviewed and have noted: 1) the patient's medical and social history 2) The patient's current medications and supplements 3) The patient's height, weight, and BMI have been recorded in the chart

## 2020-10-06 NOTE — Assessment & Plan Note (Signed)
Severe uncontrolled -  Lab Results  Component Value Date   HGBA1C 13.4 (H) 10/03/2020   Stable, pt to restart current medical treatment glucotrol, metformn, actos, rybelsus and f/u endo - pt states will call to do this

## 2020-10-06 NOTE — Assessment & Plan Note (Signed)
Last vitamin D Lab Results  Component Value Date   VD25OH 16.71 (L) 10/03/2020   Low uncontrolled, to start oral replacement

## 2020-10-06 NOTE — Assessment & Plan Note (Signed)
Uncontrolled likely currently due to not taking meds -  Lab Results  Component Value Date   LDLCALC 56 12/31/2017   Pt to restart statin crestor 40

## 2020-10-08 ENCOUNTER — Other Ambulatory Visit: Payer: Self-pay | Admitting: Internal Medicine

## 2020-10-08 MED ORDER — FREESTYLE LIBRE 2 SENSOR MISC: 3 refills | Status: AC

## 2022-01-16 ENCOUNTER — Other Ambulatory Visit (INDEPENDENT_AMBULATORY_CARE_PROVIDER_SITE_OTHER): Payer: 59

## 2022-01-16 ENCOUNTER — Other Ambulatory Visit: Payer: 59

## 2022-01-16 ENCOUNTER — Other Ambulatory Visit: Payer: Self-pay | Admitting: Internal Medicine

## 2022-01-16 DIAGNOSIS — E1165 Type 2 diabetes mellitus with hyperglycemia: Secondary | ICD-10-CM | POA: Diagnosis not present

## 2022-01-16 DIAGNOSIS — E538 Deficiency of other specified B group vitamins: Secondary | ICD-10-CM

## 2022-01-16 DIAGNOSIS — Z125 Encounter for screening for malignant neoplasm of prostate: Secondary | ICD-10-CM | POA: Diagnosis not present

## 2022-01-16 DIAGNOSIS — E559 Vitamin D deficiency, unspecified: Secondary | ICD-10-CM | POA: Diagnosis not present

## 2022-01-16 LAB — CBC WITH DIFFERENTIAL/PLATELET
Basophils Absolute: 0 10*3/uL (ref 0.0–0.1)
Basophils Relative: 0.8 % (ref 0.0–3.0)
Eosinophils Absolute: 0 10*3/uL (ref 0.0–0.7)
Eosinophils Relative: 0.6 % (ref 0.0–5.0)
HCT: 41.9 % (ref 39.0–52.0)
Hemoglobin: 14.4 g/dL (ref 13.0–17.0)
Lymphocytes Relative: 54.9 % — ABNORMAL HIGH (ref 12.0–46.0)
Lymphs Abs: 2.5 10*3/uL (ref 0.7–4.0)
MCHC: 34.3 g/dL (ref 30.0–36.0)
MCV: 86 fl (ref 78.0–100.0)
Monocytes Absolute: 0.3 10*3/uL (ref 0.1–1.0)
Monocytes Relative: 6.8 % (ref 3.0–12.0)
Neutro Abs: 1.7 10*3/uL (ref 1.4–7.7)
Neutrophils Relative %: 36.9 % — ABNORMAL LOW (ref 43.0–77.0)
Platelets: 172 10*3/uL (ref 150.0–400.0)
RBC: 4.87 Mil/uL (ref 4.22–5.81)
RDW: 13.3 % (ref 11.5–15.5)
WBC: 4.5 10*3/uL (ref 4.0–10.5)

## 2022-01-16 LAB — URINALYSIS, ROUTINE W REFLEX MICROSCOPIC
Bilirubin Urine: NEGATIVE
Hgb urine dipstick: NEGATIVE
Ketones, ur: NEGATIVE
Leukocytes,Ua: NEGATIVE
Nitrite: NEGATIVE
RBC / HPF: NONE SEEN (ref 0–?)
Specific Gravity, Urine: 1.01 (ref 1.000–1.030)
Total Protein, Urine: NEGATIVE
Urine Glucose: >=1000 — AB
Urobilinogen, UA: 0.2 (ref 0.0–1.0)
pH: 6 (ref 5.0–8.0)

## 2022-01-16 LAB — TSH: TSH: 2.06 u[IU]/mL (ref 0.35–5.50)

## 2022-01-16 LAB — LIPID PANEL
Cholesterol: 136 mg/dL (ref 0–200)
HDL: 43.5 mg/dL (ref >39.00)
LDL Cholesterol: 71 mg/dL (ref 0–99)
NonHDL: 92.7
Total CHOL/HDL Ratio: 3
Triglycerides: 109 mg/dL (ref 0.0–149.0)
VLDL: 21.8 mg/dL (ref 0.0–40.0)

## 2022-01-16 LAB — HEPATIC FUNCTION PANEL
ALT: 19 U/L (ref 0–53)
AST: 14 U/L (ref 0–37)
Albumin: 4 g/dL (ref 3.5–5.2)
Alkaline Phosphatase: 55 U/L (ref 39–117)
Bilirubin, Direct: 0.1 mg/dL (ref 0.0–0.3)
Total Bilirubin: 0.4 mg/dL (ref 0.2–1.2)
Total Protein: 7.2 g/dL (ref 6.0–8.3)

## 2022-01-16 LAB — VITAMIN D 25 HYDROXY (VIT D DEFICIENCY, FRACTURES): VITD: 22.24 ng/mL — ABNORMAL LOW (ref 30.00–100.00)

## 2022-01-16 LAB — BASIC METABOLIC PANEL
BUN: 14 mg/dL (ref 6–23)
CO2: 26 mEq/L (ref 19–32)
Calcium: 9 mg/dL (ref 8.4–10.5)
Chloride: 102 mEq/L (ref 96–112)
Creatinine, Ser: 0.94 mg/dL (ref 0.40–1.50)
GFR: 94.09 mL/min (ref >60.00)
Glucose, Bld: 218 mg/dL — ABNORMAL HIGH (ref 70–99)
Potassium: 4 mEq/L (ref 3.5–5.1)
Sodium: 137 mEq/L (ref 135–145)

## 2022-01-16 LAB — VITAMIN B12: Vitamin B-12: 403 pg/mL (ref 211–911)

## 2022-01-16 LAB — MICROALBUMIN / CREATININE URINE RATIO
Creatinine,U: 72.4 mg/dL
Microalb Creat Ratio: 9.5 mg/g (ref 0.0–30.0)
Microalb, Ur: 6.9 mg/dL — ABNORMAL HIGH (ref 0.0–1.9)

## 2022-01-16 LAB — PSA: PSA: 0.48 ng/mL (ref 0.10–4.00)

## 2022-01-19 ENCOUNTER — Other Ambulatory Visit: Payer: 59

## 2022-01-19 DIAGNOSIS — E1100 Type 2 diabetes mellitus with hyperosmolarity without nonketotic hyperglycemic-hyperosmolar coma (NKHHC): Secondary | ICD-10-CM

## 2022-01-20 ENCOUNTER — Ambulatory Visit (INDEPENDENT_AMBULATORY_CARE_PROVIDER_SITE_OTHER): Payer: 59 | Admitting: Internal Medicine

## 2022-01-20 VITALS — BP 130/82 | HR 80 | Temp 98.2°F | Ht 72.0 in | Wt 260.0 lb

## 2022-01-20 DIAGNOSIS — Z0001 Encounter for general adult medical examination with abnormal findings: Secondary | ICD-10-CM | POA: Diagnosis not present

## 2022-01-20 DIAGNOSIS — E119 Type 2 diabetes mellitus without complications: Secondary | ICD-10-CM

## 2022-01-20 DIAGNOSIS — Z23 Encounter for immunization: Secondary | ICD-10-CM | POA: Diagnosis not present

## 2022-01-20 DIAGNOSIS — E785 Hyperlipidemia, unspecified: Secondary | ICD-10-CM

## 2022-01-20 DIAGNOSIS — E1165 Type 2 diabetes mellitus with hyperglycemia: Secondary | ICD-10-CM

## 2022-01-20 DIAGNOSIS — Z1211 Encounter for screening for malignant neoplasm of colon: Secondary | ICD-10-CM

## 2022-01-20 DIAGNOSIS — I10 Essential (primary) hypertension: Secondary | ICD-10-CM

## 2022-01-20 DIAGNOSIS — E559 Vitamin D deficiency, unspecified: Secondary | ICD-10-CM | POA: Diagnosis not present

## 2022-01-20 DIAGNOSIS — H6123 Impacted cerumen, bilateral: Secondary | ICD-10-CM | POA: Diagnosis not present

## 2022-01-20 DIAGNOSIS — Z1159 Encounter for screening for other viral diseases: Secondary | ICD-10-CM | POA: Diagnosis not present

## 2022-01-20 LAB — HEMOGLOBIN A1C
Hgb A1c MFr Bld: 12.1 % of total Hgb — ABNORMAL HIGH (ref <5.7)
Mean Plasma Glucose: 301 mg/dL
eAG (mmol/L): 16.6 mmol/L

## 2022-01-20 MED ORDER — FENOFIBRATE 160 MG PO TABS: 160.0000 mg | ORAL_TABLET | Freq: Every day | ORAL | 3 refills | Status: DC

## 2022-01-20 MED ORDER — METFORMIN HCL ER 500 MG PO TB24: 2000.0000 mg | ORAL_TABLET | Freq: Every day | ORAL | 3 refills | Status: DC

## 2022-01-20 MED ORDER — PIOGLITAZONE HCL 45 MG PO TABS: 45.0000 mg | ORAL_TABLET | Freq: Every day | ORAL | 3 refills | Status: DC

## 2022-01-20 MED ORDER — DAPAGLIFLOZIN PROPANEDIOL 10 MG PO TABS: 10.0000 mg | ORAL_TABLET | Freq: Every day | ORAL | 3 refills | Status: DC

## 2022-01-20 MED ORDER — LOSARTAN POTASSIUM 25 MG PO TABS: 25.0000 mg | ORAL_TABLET | Freq: Every day | ORAL | 3 refills | Status: DC

## 2022-01-20 MED ORDER — TIRZEPATIDE 2.5 MG/0.5ML ~~LOC~~ SOAJ: 2.5000 mg | SUBCUTANEOUS | 11 refills | Status: DC

## 2022-01-20 MED ORDER — ROSUVASTATIN CALCIUM 40 MG PO TABS: 40.0000 mg | ORAL_TABLET | Freq: Every day | ORAL | 3 refills | Status: DC

## 2022-01-20 NOTE — Patient Instructions (Addendum)
Please have your Shingrix (shingles) shots done at your local pharmacy.  You had the flu shot today  Your ears were irrigated of wax today  You will be contacted regarding the referral for: podiatry, and colonoscopy  Ok to increase the Vitamin D3 to 4000 units per day  Ok to STOP the glipizide and rybelsus for now  [facility] to change the metformin to the ER 500 mg - 4 pills in the AM  Please take all new medication as prescribed - the Farxiga 10 mg per day, and Mounjaro 2.5 mg weekly  Please continue all other medications as before, and refills have been done if requested.  Please have the pharmacy call with any other refills you may need.  Please continue your efforts at being more active, low cholesterol diet, and weight control.  You are otherwise up to date with prevention measures today.  Please keep your appointments with your specialists as you may have planned  Please make an Appointment to return in 3 months, or sooner if needed, also with Lab Appointment for testing done 3-5 days before at the [place] (so this is for TWO appointments - please see the scheduling desk as you leave)

## 2022-01-20 NOTE — Progress Notes (Signed)
Patient ID: Dylan [name], male   DOB: [birth year], 51 y.o.   MRN: [id number]         Chief Complaint:: wellness exam and low vit d, dm. Bilateral ear wax impactions       HPI:  Dylan [name] is a 52 y.o. male here for wellness exam; declines all vax except flu shot, and colonoscopy o/w up to date                        Also Pt denies chest pain, increased sob or doe, wheezing, orthopnea, PND, increased LE swelling, palpitations, dizziness or syncope.  Has reduced hearing bilateral in the past wk possible wax impactions.   Pt denies polydipsia, polyuria, or new focal neuro s/s.    Pt denies fever, wt loss, night sweats, loss of appetite, or other constitutional symptoms      Wt Readings from Last 3 Encounters:  01/20/22 260 lb (117.9 kg)  10/04/20 268 lb 12.8 oz (121.9 kg)  09/26/19 281 lb (127.5 kg)   BP Readings from Last 3 Encounters:  01/20/22 130/82  10/04/20 132/88  09/26/19 120/84   Immunization History  Administered Date(s) Administered   Influenza Split 12/31/2011   Influenza Whole 05/10/2009   Influenza,inj,Quad PF,6+ Mos 02/07/2013, 06/20/2014, 01/15/2017, 01/03/2018, 01/20/2022   Influenza,inj,quad, With Preservative 01/25/2017   Influenza-Unspecified 07/05/2015   Pneumococcal Polysaccharide-23 04/25/2008, 01/15/2017   Td 04/27/2004   Tdap 06/20/2014   There are no preventive care reminders to display for this patient.     Past Medical History:  Diagnosis Date   ALLERGIC RHINITIS 11/25/2007   ASTHMA 11/25/2007   Asthma    CARPAL TUNNEL SYNDROME, LEFT, MILD 11/25/2007   DIABETES MELLITUS, TYPE II 11/25/2007   Femur fracture, left ([place]) 04/07/2017   HYPERLIPIDEMIA Q000111Q   Metabolic syndrome Q000111Q   NECK PAIN 08/27/2009   Obesity    Other specified forms of hearing loss 05/10/2009   Other symptoms referable to lower leg joint 11/25/2007   SHOULDER PAIN, LEFT 08/27/2009   Unspecified hearing loss 04/25/2008   Vitamin D deficiency 04/09/2017   Past  Surgical History:  Procedure Laterality Date   BACK SURGERY     FEMUR IM NAIL Left 04/08/2017   Procedure: INTRAMEDULLARY (IM) RETROGRADE FEMORAL NAILING;  Surgeon: Altamese [place], MD;  Location: [facility];  Service: Orthopedics;  Laterality: Left;   s/p back surgury  2002   Lumbar disc    reports that he has never smoked. He has never used smokeless tobacco. He reports that he does not drink alcohol and does not use drugs. family history includes Diabetes in an other family member. Allergies  Allergen Reactions   Penicillins Other (See Comments)    From childhood: Has patient had a PCN reaction causing immediate rash, facial/tongue/throat swelling, SOB or lightheadedness with hypotension: Unk Has patient had a PCN reaction causing severe rash involving mucus membranes or skin necrosis: Unk Has patient had a PCN reaction that required hospitalization: Unk Has patient had a PCN reaction occurring within the last 10 years: No If all of the above answers are "NO", then may proceed with Cephalosporin use.    Current Outpatient Medications on File Prior to Visit  Medication Sig Dispense Refill   acetaminophen (TYLENOL) 325 MG tablet Take 650 mg by mouth every 6 (six) hours as needed for moderate pain.     Cholecalciferol 50 MCG (2000 UT) CAPS      cholecalciferol 5000 units TABS Take  0.4 tablets (2,000 Units total) by mouth daily. 30 tablet 2   Continuous Blood Gluc Receiver (FREESTYLE LIBRE 2 READER) DEVI Use as directed twice daily E11.9 1 each 2   Continuous Blood Gluc Sensor (FREESTYLE LIBRE 2 SENSOR) MISC Use as directed twice per day E11.9 6 each 3   Lancets Misc. MISC Use as directed twice daily 200 each 11   No current facility-administered medications on file prior to visit.        ROS:  All others reviewed and negative.  Objective        PE:  BP 130/82 (BP Location: Left Arm, Patient Position: Sitting, Cuff Size: Large)   Pulse 80   Temp 98.2 F (36.8 C) (Oral)   Ht 6' (1.829  m)   Wt 260 lb (117.9 kg)   SpO2 98%   BMI 35.26 kg/m                 Constitutional: Pt appears in NAD               HENT: Head: NCAT.                Right Ear: External ear normal.                 Left Ear: External ear normal.                Eyes: . Pupils are equal, round, and reactive to light. Conjunctivae and EOM are normal               Nose: without d/c or deformity               Neck: Neck supple. Gross normal ROM               Cardiovascular: Normal rate and regular rhythm.                 Pulmonary/Chest: Effort normal and breath sounds without rales or wheezing.                Abd:  Soft, NT, ND, + BS, no organomegaly               Neurological: Pt is alert. At baseline orientation, motor grossly intact               Skin: Skin is warm. No rashes, no other new lesions, LE edema - none               Psychiatric: Pt behavior is normal without agitation   Micro: none  Cardiac tracings I have personally interpreted today:  none  Pertinent Radiological findings (summarize): none   Lab Results  Component Value Date   WBC 4.5 01/16/2022   HGB 14.4 01/16/2022   HCT 41.9 01/16/2022   PLT 172.0 01/16/2022   GLUCOSE 218 (H) 01/16/2022   CHOL 136 01/16/2022   TRIG 109.0 01/16/2022   HDL 43.50 01/16/2022   LDLDIRECT 104.0 10/03/2020   LDLCALC 71 01/16/2022   ALT 19 01/16/2022   AST 14 01/16/2022   NA 137 01/16/2022   K 4.0 01/16/2022   CL 102 01/16/2022   CREATININE 0.94 01/16/2022   BUN 14 01/16/2022   CO2 26 01/16/2022   TSH 2.06 01/16/2022   PSA 0.48 01/16/2022   INR 1.03 04/07/2017   HGBA1C 12.1 (H) 01/19/2022   MICROALBUR 6.9 (H) 01/16/2022   Assessment/Plan:  Dylan [name] is a 51 y.o. Black or African American [  2] male with  has a past medical history of ALLERGIC RHINITIS (11/25/2007), ASTHMA (11/25/2007), Asthma, CARPAL TUNNEL SYNDROME, LEFT, MILD (11/25/2007), DIABETES MELLITUS, TYPE II (11/25/2007), Femur fracture, left ([place]) (04/07/2017), HYPERLIPIDEMIA  (Q000111Q), Metabolic syndrome (Q000111Q), NECK PAIN (08/27/2009), Obesity, Other specified forms of hearing loss (05/10/2009), Other symptoms referable to lower leg joint (11/25/2007), SHOULDER PAIN, LEFT (08/27/2009), Unspecified hearing loss (04/25/2008), and Vitamin D deficiency (04/09/2017).  Encounter for well adult exam with abnormal findings Age and sex appropriate education and counseling updated with regular exercise and diet Referrals for preventative services - for colonoscopy Immunizations addressed - declines covid booster, tdap, shingrix, for flu shot Smoking counseling  - none needed Evidence for depression or other mood disorder - none significant Most recent labs reviewed. I have personally reviewed and have noted: 1) the patient's medical and social history 2) The patient's current medications and supplements 3) The patient's height, weight, and BMI have been recorded in the chart   Vitamin D deficiency Last vitamin D Lab Results  Component Value Date   VD25OH 22.24 (L) 01/16/2022   Low, to increase oral replacement to 4000 u qd   Hyperlipidemia Lab Results  Component Value Date   LDLCALC 71 01/16/2022   Uncontrolled,  pt to continue current crestor 40 mg qd, lower chol diet, as declines change for now   Essential hypertension BP Readings from Last 3 Encounters:  01/20/22 130/82  10/04/20 132/88  09/26/19 120/84   Stable, pt to continue medical treatment losartan 25 mg qd   Diabetes ([place]) Lab Results  Component Value Date   HGBA1C 12.1 (H) 01/19/2022   Severe uncontrolled, to d/c glipziide, and rybelsus, cont actos 45 mh,, to start farxiga 10 mg qd, mounjaro 2.5 mg weekly, and change the metformin 1000 mg pills to the metformin ER 500 mg - 4 tabs in the am and has stopped taking the larger pills   Impacted cerumen, bilateral Resolved with irrigation  Ceruminosis is noted.  Wax is removed by syringing and manual debridement. Instructions for home  care to prevent wax buildup are given.  Followup: Return in about 3 months (around 04/21/2022).  Cathlean Cower, MD 01/24/2022 8:55 PM [facility]

## 2022-01-21 ENCOUNTER — Telehealth: Payer: Self-pay | Admitting: Internal Medicine

## 2022-01-21 MED ORDER — EMPAGLIFLOZIN 25 MG PO TABS: 25.0000 mg | ORAL_TABLET | Freq: Every day | ORAL | 3 refills | Status: DC

## 2022-01-21 NOTE — Telephone Encounter (Signed)
Patient called and said that you had discussed medications with him yesterday and said that if one was too high you could call in something else for the farxiga and the monjaro.  Please advise.

## 2022-01-21 NOTE — Telephone Encounter (Signed)
Please advise 

## 2022-01-21 NOTE — Telephone Encounter (Signed)
Ok to try jardiance 25 mg per day - done erx  Ok to Not take the farxiga and mounjaro due to being too expensive

## 2022-01-21 NOTE — Telephone Encounter (Signed)
Patient wants to know if he should take the jardiance and actos together. But for farxiga and mounjara, was he supposed to take those both for his diabetes or were they supposed to do two different things.

## 2022-01-21 NOTE — Telephone Encounter (Signed)
Yes ok to take all as rx

## 2022-01-24 ENCOUNTER — Encounter: Payer: Self-pay | Admitting: Internal Medicine

## 2022-01-24 DIAGNOSIS — H6123 Impacted cerumen, bilateral: Secondary | ICD-10-CM | POA: Insufficient documentation

## 2022-01-24 NOTE — Assessment & Plan Note (Signed)
Lab Results  Component Value Date   HGBA1C 12.1 (H) 01/19/2022   Severe uncontrolled, to d/c glipziide, and rybelsus, cont actos 45 mh,, to start farxiga 10 mg qd, mounjaro 2.5 mg weekly, and change the metformin 1000 mg pills to the metformin ER 500 mg - 4 tabs in the am and has stopped taking the larger pills

## 2022-01-24 NOTE — Assessment & Plan Note (Signed)
BP Readings from Last 3 Encounters:  01/20/22 130/82  10/04/20 132/88  09/26/19 120/84   Stable, pt to continue medical treatment losartan 25 mg qd

## 2022-01-24 NOTE — Assessment & Plan Note (Addendum)
Lab Results  Component Value Date   LDLCALC 71 01/16/2022   Uncontrolled,  pt to continue current crestor 40 mg qd, lower chol diet, as declines change for now

## 2022-01-24 NOTE — Assessment & Plan Note (Signed)
Age and sex appropriate education and counseling updated with regular exercise and diet Referrals for preventative services - for colonoscopy Immunizations addressed - declines covid booster, tdap, shingrix, for flu shot Smoking counseling  - none needed Evidence for depression or other mood disorder - none significant Most recent labs reviewed. I have personally reviewed and have noted: 1) the patient's medical and social history 2) The patient's current medications and supplements 3) The patient's height, weight, and BMI have been recorded in the chart

## 2022-01-24 NOTE — Assessment & Plan Note (Addendum)
Last vitamin D Lab Results  Component Value Date   VD25OH 22.24 (L) 01/16/2022   Low, to increase oral replacement to 4000 u qd

## 2022-01-24 NOTE — Assessment & Plan Note (Signed)
Resolved with irrigation  Ceruminosis is noted.  Wax is removed by syringing and manual debridement. Instructions for home care to prevent wax buildup are given.  

## 2022-02-02 NOTE — Telephone Encounter (Signed)
Current med list is consistent with jardiance, metformin and actos

## 2022-02-02 NOTE — Telephone Encounter (Signed)
Patient calls back and would like to specify which meds to take and which ones not to take?

## 2022-02-05 ENCOUNTER — Ambulatory Visit (INDEPENDENT_AMBULATORY_CARE_PROVIDER_SITE_OTHER): Payer: 59 | Admitting: Podiatry

## 2022-02-05 DIAGNOSIS — M792 Neuralgia and neuritis, unspecified: Secondary | ICD-10-CM

## 2022-02-05 DIAGNOSIS — E1142 Type 2 diabetes mellitus with diabetic polyneuropathy: Secondary | ICD-10-CM

## 2022-02-05 MED ORDER — GABAPENTIN 300 MG PO CAPS: 300.0000 mg | ORAL_CAPSULE | Freq: Three times a day (TID) | ORAL | 3 refills | Status: DC

## 2022-02-05 NOTE — Progress Notes (Signed)
Subjective:  Patient ID: Dylan [name], male    DOB: Sep 10, 1970,  MRN: [id number]  Chief Complaint  Patient presents with   Foot Pain    Room 15  Pt states he has been having tinging is both feet for about 3 weeks     51 y.o. male presents with concern for tingling pins-and-needles sensation and some burning in both feet for about 3 weeks.  She does have a history of diabetes more recently diagnosed.  She has never been diagnosed with neuropathy previously.  She does report this pain is worse at night feels like there is a burning sensation.  She also reports pins-and-needles and tingling this is sporadic she is not sure what brings it on.  Nothing seems to resolve it.  Past Medical History:  Diagnosis Date   ALLERGIC RHINITIS 11/25/2007   ASTHMA 11/25/2007   Asthma    CARPAL TUNNEL SYNDROME, LEFT, MILD 11/25/2007   DIABETES MELLITUS, TYPE II 11/25/2007   Femur fracture, left ([place]) 04/07/2017   HYPERLIPIDEMIA 08/13/6220   Metabolic syndrome 97/98/9211   NECK PAIN 08/27/2009   Obesity    Other specified forms of hearing loss 05/10/2009   Other symptoms referable to lower leg joint 11/25/2007   SHOULDER PAIN, LEFT 08/27/2009   Unspecified hearing loss 04/25/2008   Vitamin D deficiency 04/09/2017    Allergies  Allergen Reactions   Penicillins Other (See Comments)    From childhood: Has patient had a PCN reaction causing immediate rash, facial/tongue/throat swelling, SOB or lightheadedness with hypotension: Unk Has patient had a PCN reaction causing severe rash involving mucus membranes or skin necrosis: Unk Has patient had a PCN reaction that required hospitalization: Unk Has patient had a PCN reaction occurring within the last 10 years: No If all of the above answers are "NO", then may proceed with Cephalosporin use.     ROS: Negative except as per HPI above  Objective:  General: AAO x3, NAD  Dermatological: With inspection and palpation of the right and left lower extremities  there are no open sores, no preulcerative lesions, no rash or signs of infection present. Nails are of normal length thickness and coloration.   Vascular:  Dorsalis Pedis artery and Posterior Tibial artery pedal pulses are 2/4 bilateral.  Capillary fill time < 3 sec to all digits.   Neruologic: Grossly diminished via light touch bilateral. Protective threshold intact to all sites bilateral.   Musculoskeletal: No gross boney pedal deformities bilateral. No pain, crepitus, or limitation noted with foot and ankle range of motion bilateral. Muscular strength 5/5 in all groups tested bilateral.  Gait: Unassisted, Nonantalgic.   No images are attached to the encounter.   Assessment:   1. Neuropathic pain   2. DM type 2 with diabetic peripheral neuropathy ([place])      Plan:  Patient was evaluated and treated and all questions answered.  #Neuropathic pain secondary to diabetes type 2 with peripheral neuropathy -Discussed with the patient I believe her burning tingling and pins and needle sensation is related to neuropathic pain related to her diabetes. -I discussed conservative treatment options including continued monitoring versus medication management. -Prescription for gabapentin 300 mg 3 times daily sent to the patient's pharmacy. -Discussed side effects and possible complications associated with using gabapentin including drowsiness.  I recommend she begin by taking this tablet only at night and see if that resolves her issue and then slowly titrate up from there as needed for effect.  Patient understands this plan for slow  increase in use of the medication. -I will see the patient back in 3 months for recheck of neuropathic pain.  If she has any trouble with the medication in the interim she should discontinue its use and follow-up further with primary care. -I also recommend strict blood glucose control diet and exercise.  No follow-ups on file.          Corinna Gab, DPM [facility]

## 2022-05-14 ENCOUNTER — Ambulatory Visit: Payer: 59 | Admitting: Podiatry

## 2022-05-19 ENCOUNTER — Other Ambulatory Visit (INDEPENDENT_AMBULATORY_CARE_PROVIDER_SITE_OTHER): Payer: 59

## 2022-05-19 ENCOUNTER — Other Ambulatory Visit: Payer: Self-pay | Admitting: Internal Medicine

## 2022-05-19 DIAGNOSIS — E1165 Type 2 diabetes mellitus with hyperglycemia: Secondary | ICD-10-CM

## 2022-05-19 DIAGNOSIS — E538 Deficiency of other specified B group vitamins: Secondary | ICD-10-CM

## 2022-05-19 DIAGNOSIS — Z125 Encounter for screening for malignant neoplasm of prostate: Secondary | ICD-10-CM

## 2022-05-19 DIAGNOSIS — E559 Vitamin D deficiency, unspecified: Secondary | ICD-10-CM | POA: Diagnosis not present

## 2022-05-19 DIAGNOSIS — Z1159 Encounter for screening for other viral diseases: Secondary | ICD-10-CM

## 2022-05-19 LAB — HEPATIC FUNCTION PANEL
ALT: 21 U/L (ref 0–53)
AST: 17 U/L (ref 0–37)
Albumin: 4.1 g/dL (ref 3.5–5.2)
Alkaline Phosphatase: 50 U/L (ref 39–117)
Bilirubin, Direct: 0.1 mg/dL (ref 0.0–0.3)
Total Bilirubin: 0.6 mg/dL (ref 0.2–1.2)
Total Protein: 7 g/dL (ref 6.0–8.3)

## 2022-05-19 LAB — CBC WITH DIFFERENTIAL/PLATELET
Basophils Absolute: 0 10*3/uL (ref 0.0–0.1)
Basophils Relative: 0.8 % (ref 0.0–3.0)
Eosinophils Absolute: 0 10*3/uL (ref 0.0–0.7)
Eosinophils Relative: 0.6 % (ref 0.0–5.0)
HCT: 42.3 % (ref 39.0–52.0)
Hemoglobin: 14.8 g/dL (ref 13.0–17.0)
Lymphocytes Relative: 58.7 % — ABNORMAL HIGH (ref 12.0–46.0)
Lymphs Abs: 2.7 10*3/uL (ref 0.7–4.0)
MCHC: 34.9 g/dL (ref 30.0–36.0)
MCV: 85.3 fl (ref 78.0–100.0)
Monocytes Absolute: 0.3 10*3/uL (ref 0.1–1.0)
Monocytes Relative: 7.1 % (ref 3.0–12.0)
Neutro Abs: 1.5 10*3/uL (ref 1.4–7.7)
Neutrophils Relative %: 32.8 % — ABNORMAL LOW (ref 43.0–77.0)
Platelets: 196 10*3/uL (ref 150.0–400.0)
RBC: 4.96 Mil/uL (ref 4.22–5.81)
RDW: 13.3 % (ref 11.5–15.5)
WBC: 4.5 10*3/uL (ref 4.0–10.5)

## 2022-05-19 LAB — LIPID PANEL
Cholesterol: 158 mg/dL (ref 0–200)
HDL: 40.1 mg/dL (ref >39.00)
LDL Cholesterol: 94 mg/dL (ref 0–99)
NonHDL: 118.18
Total CHOL/HDL Ratio: 4
Triglycerides: 122 mg/dL (ref 0.0–149.0)
VLDL: 24.4 mg/dL (ref 0.0–40.0)

## 2022-05-19 LAB — HEMOGLOBIN A1C: Hgb A1c MFr Bld: 8.9 % — ABNORMAL HIGH (ref 4.6–6.5)

## 2022-05-19 LAB — BASIC METABOLIC PANEL
BUN: 7 mg/dL (ref 6–23)
CO2: 27 mEq/L (ref 19–32)
Calcium: 8.8 mg/dL (ref 8.4–10.5)
Chloride: 102 mEq/L (ref 96–112)
Creatinine, Ser: 0.6 mg/dL (ref 0.40–1.50)
GFR: 111.78 mL/min (ref >60.00)
Glucose, Bld: 143 mg/dL — ABNORMAL HIGH (ref 70–99)
Potassium: 4 mEq/L (ref 3.5–5.1)
Sodium: 138 mEq/L (ref 135–145)

## 2022-05-19 LAB — VITAMIN D 25 HYDROXY (VIT D DEFICIENCY, FRACTURES): VITD: 21.95 ng/mL — ABNORMAL LOW (ref 30.00–100.00)

## 2022-05-19 LAB — PSA: PSA: 0.54 ng/mL (ref 0.10–4.00)

## 2022-05-19 LAB — VITAMIN B12: Vitamin B-12: 401 pg/mL (ref 211–911)

## 2022-05-19 LAB — TSH: TSH: 2.8 u[IU]/mL (ref 0.35–5.50)

## 2022-05-20 ENCOUNTER — Other Ambulatory Visit: Payer: 59

## 2022-05-20 ENCOUNTER — Ambulatory Visit (INDEPENDENT_AMBULATORY_CARE_PROVIDER_SITE_OTHER): Payer: 59 | Admitting: Internal Medicine

## 2022-05-20 VITALS — BP 138/88 | HR 92 | Temp 98.4°F | Ht 72.0 in | Wt 252.5 lb

## 2022-05-20 DIAGNOSIS — H6123 Impacted cerumen, bilateral: Secondary | ICD-10-CM

## 2022-05-20 DIAGNOSIS — Z1159 Encounter for screening for other viral diseases: Secondary | ICD-10-CM | POA: Diagnosis not present

## 2022-05-20 DIAGNOSIS — E119 Type 2 diabetes mellitus without complications: Secondary | ICD-10-CM

## 2022-05-20 DIAGNOSIS — Z0001 Encounter for general adult medical examination with abnormal findings: Secondary | ICD-10-CM

## 2022-05-20 DIAGNOSIS — E785 Hyperlipidemia, unspecified: Secondary | ICD-10-CM | POA: Diagnosis not present

## 2022-05-20 DIAGNOSIS — I10 Essential (primary) hypertension: Secondary | ICD-10-CM

## 2022-05-20 DIAGNOSIS — E559 Vitamin D deficiency, unspecified: Secondary | ICD-10-CM

## 2022-05-20 DIAGNOSIS — M542 Cervicalgia: Secondary | ICD-10-CM

## 2022-05-20 LAB — URINALYSIS, ROUTINE W REFLEX MICROSCOPIC
Bilirubin Urine: NEGATIVE
Hgb urine dipstick: NEGATIVE
Ketones, ur: NEGATIVE
Leukocytes,Ua: NEGATIVE
Nitrite: NEGATIVE
RBC / HPF: NONE SEEN (ref 0–?)
Specific Gravity, Urine: 1.02 (ref 1.000–1.030)
Total Protein, Urine: NEGATIVE
Urine Glucose: >=1000 — AB
Urobilinogen, UA: 0.2 (ref 0.0–1.0)
pH: 6 (ref 5.0–8.0)

## 2022-05-20 LAB — HEPATITIS C ANTIBODY: Hepatitis C Ab: NONREACTIVE

## 2022-05-20 LAB — MICROALBUMIN / CREATININE URINE RATIO
Creatinine,U: 86.5 mg/dL
Microalb Creat Ratio: 0.8 mg/g (ref 0.0–30.0)
Microalb, Ur: <0.7 mg/dL (ref 0.0–1.9)

## 2022-05-20 MED ORDER — TIRZEPATIDE 5 MG/0.5ML ~~LOC~~ SOAJ: 5.0000 mg | SUBCUTANEOUS | 3 refills | Status: DC

## 2022-05-20 MED ORDER — PIOGLITAZONE HCL 45 MG PO TABS: 45.0000 mg | ORAL_TABLET | Freq: Every day | ORAL | 3 refills | Status: DC

## 2022-05-20 NOTE — Patient Instructions (Signed)
Ok to increase the Mounjaro to 5 mg weekly  Your ears were irrigated of wax today  Please continue all other medications as before, and refills have been done if requested.  Please have the pharmacy call with any other refills you may need.  Please continue your efforts at being more active, low cholesterol diet, and weight control.  You are otherwise up to date with prevention measures today.  Please keep your appointments with your specialists as you may have planned  Please make an Appointment to return in 6 months, or sooner if needed, also with Lab Appointment for testing done 3-5 days before at the [facility] (so this is for TWO appointments - please see the scheduling desk as you leave)

## 2022-05-20 NOTE — Progress Notes (Unsigned)
Patient ID: Dylan [name], male   DOB: [birth year], 52 y.o.   MRN: [id number]         Chief Complaint:: wellness exam and bilateral ear wax impactions, left neck upper back pain, dm, htn, low vit d       HPI:  Dylan [name] is a 52 y.o. male here for wellness exam; declines covid booster, for shingrix at pharmacy, declines colonoscopy o/w up to date               Also Lost wt with better diet and start mounjaro last visit 2.5 gm , tolerating well. Pt denies chest pain, increased sob or doe, wheezing, orthopnea, PND, increased LE swelling, palpitations, dizziness or syncope.   Pt denies polydipsia, polyuria, or new focal neuro s/s.    Pt denies fever, wt loss, night sweats, loss of appetite, or other constitutional symptoms  Has bialteral ear wax impactions worsening with hearing loss in the past week.  Also has 2 mo mild to mod recurrent dull left neck pain  with radiation to the left upper back, maybe to the upper arm as well.  No UE weakness.     Wt Readings from Last 3 Encounters:  05/20/22 252 lb 8 oz (114.5 kg)  01/20/22 260 lb (117.9 kg)  10/04/20 268 lb 12.8 oz (121.9 kg)   BP Readings from Last 3 Encounters:  05/20/22 138/88  01/20/22 130/82  10/04/20 132/88   Immunization History  Administered Date(s) Administered   Influenza Split 12/31/2011   Influenza Whole 05/10/2009   Influenza,inj,Quad PF,6+ Mos 02/07/2013, 06/20/2014, 01/15/2017, 01/03/2018, 01/20/2022   Influenza,inj,quad, With Preservative 01/25/2017   Influenza-Unspecified 07/05/2015   Pneumococcal Polysaccharide-23 04/25/2008, 01/15/2017   Td 04/27/2004   Tdap 06/20/2014   Health Maintenance Due  Topic Date Due   COVID-19 Vaccine (1) Never done   Zoster Vaccines- Shingrix (1 of 2) Never done      Past Medical History:  Diagnosis Date   ALLERGIC RHINITIS 11/25/2007   ASTHMA 11/25/2007   Asthma    CARPAL TUNNEL SYNDROME, LEFT, MILD 11/25/2007   DIABETES MELLITUS, TYPE II 11/25/2007   Femur fracture, left  (HCC) 04/07/2017   HYPERLIPIDEMIA 11/25/2007   Metabolic syndrome 04/09/2017   NECK PAIN 08/27/2009   Obesity    Other specified forms of hearing loss 05/10/2009   Other symptoms referable to lower leg joint 11/25/2007   SHOULDER PAIN, LEFT 08/27/2009   Unspecified hearing loss 04/25/2008   Vitamin D deficiency 04/09/2017   Past Surgical History:  Procedure Laterality Date   BACK SURGERY     FEMUR IM NAIL Left 04/08/2017   Procedure: INTRAMEDULLARY (IM) RETROGRADE FEMORAL NAILING;  Surgeon: Myrene Galas, MD;  Location: [facility] OR;  Service: Orthopedics;  Laterality: Left;   s/p back surgury  2002   Lumbar disc    reports that he has never smoked. He has never used smokeless tobacco. He reports that he does not drink alcohol and does not use drugs. family history includes Diabetes in an other family member. Allergies  Allergen Reactions   Penicillins Other (See Comments)    From childhood: Has patient had a PCN reaction causing immediate rash, facial/tongue/throat swelling, SOB or lightheadedness with hypotension: Unk Has patient had a PCN reaction causing severe rash involving mucus membranes or skin necrosis: Unk Has patient had a PCN reaction that required hospitalization: Unk Has patient had a PCN reaction occurring within the last 10 years: No If all of the above answers are "NO", then  may proceed with Cephalosporin use.    Current Outpatient Medications on File Prior to Visit  Medication Sig Dispense Refill   acetaminophen (TYLENOL) 325 MG tablet Take 650 mg by mouth every 6 (six) hours as needed for moderate pain.     Cholecalciferol 50 MCG (2000 UT) CAPS      cholecalciferol 5000 units TABS Take 0.4 tablets (2,000 Units total) by mouth daily. 30 tablet 2   Continuous Blood Gluc Receiver (FREESTYLE LIBRE 2 READER) DEVI Use as directed twice daily E11.9 1 each 2   Continuous Blood Gluc Sensor (FREESTYLE LIBRE 2 SENSOR) MISC Use as directed twice per day E11.9 6 each 3    empagliflozin (JARDIANCE) 25 MG TABS tablet Take 1 tablet (25 mg total) by mouth daily before breakfast. 90 tablet 3   fenofibrate 160 MG tablet Take 1 tablet (160 mg total) by mouth daily. 90 tablet 3   gabapentin (NEURONTIN) 300 MG capsule Take 1 capsule (300 mg total) by mouth 3 (three) times daily. 90 capsule 3   Lancets Misc. MISC Use as directed twice daily 200 each 11   losartan (COZAAR) 25 MG tablet Take 1 tablet (25 mg total) by mouth daily. 90 tablet 3   metFORMIN (GLUCOPHAGE-XR) 500 MG 24 hr tablet Take 4 tablets (2,000 mg total) by mouth daily with breakfast. 360 tablet 3   rosuvastatin (CRESTOR) 40 MG tablet Take 1 tablet (40 mg total) by mouth daily. 90 tablet 3   No current facility-administered medications on file prior to visit.        ROS:  All others reviewed and negative.  Objective        PE:  BP 138/88   Pulse 92   Temp 98.4 F (36.9 C) (Temporal)   Ht 6' (1.829 m)   Wt 252 lb 8 oz (114.5 kg)   SpO2 98%   BMI 34.25 kg/m                 Constitutional: Pt appears in NAD               HENT: Head: NCAT.                Right Ear: External ear normal.                 Left Ear: External ear normal. Bilateral wax impactions resolved with irrigation               Eyes: . Pupils are equal, round, and reactive to light. Conjunctivae and EOM are normal               Nose: without d/c or deformity               Neck: Neck supple. Gross normal ROM               Cardiovascular: Normal rate and regular rhythm.                 Pulmonary/Chest: Effort normal and breath sounds without rales or wheezing.                Abd:  Soft, NT, ND, + BS, no organomegaly               Neurological: Pt is alert. At baseline orientation, motor grossly intact               Skin: Skin is warm. No rashes, no other new lesions, LE edema - none  Psychiatric: Pt behavior is normal without agitation   Micro: none  Cardiac tracings I have personally interpreted today:   none  Pertinent Radiological findings (summarize): none   Lab Results  Component Value Date   WBC 4.5 05/19/2022   HGB 14.8 05/19/2022   HCT 42.3 05/19/2022   PLT 196.0 05/19/2022   GLUCOSE 143 (H) 05/19/2022   CHOL 158 05/19/2022   TRIG 122.0 05/19/2022   HDL 40.10 05/19/2022   LDLDIRECT 104.0 10/03/2020   LDLCALC 94 05/19/2022   ALT 21 05/19/2022   AST 17 05/19/2022   NA 138 05/19/2022   K 4.0 05/19/2022   CL 102 05/19/2022   CREATININE 0.60 05/19/2022   BUN 7 05/19/2022   CO2 27 05/19/2022   TSH 2.80 05/19/2022   PSA 0.54 05/19/2022   INR 1.03 04/07/2017   HGBA1C 8.9 (H) 05/19/2022   MICROALBUR <0.7 05/19/2022   Assessment/Plan:  Dylan [name] is a 52 y.o. Black or African American [2] male with  has a past medical history of ALLERGIC RHINITIS (11/25/2007), ASTHMA (11/25/2007), Asthma, CARPAL TUNNEL SYNDROME, LEFT, MILD (11/25/2007), DIABETES MELLITUS, TYPE II (11/25/2007), Femur fracture, left ([place]) (04/07/2017), HYPERLIPIDEMIA (6/[birth year]/1950), Metabolic syndrome (93/26/7124), NECK PAIN (08/27/2009), Obesity, Other specified forms of hearing loss (05/10/2009), Other symptoms referable to lower leg joint (11/25/2007), SHOULDER PAIN, LEFT (08/27/2009), Unspecified hearing loss (04/25/2008), and Vitamin D deficiency (04/09/2017).  Encounter for well adult exam with abnormal findings Age and sex appropriate education and counseling updated with regular exercise and diet Referrals for preventative services - none needed Immunizations addressed - for shingrix at pharmacy, declines covid booster Smoking counseling  - none needed Evidence for depression or other mood disorder - none significant Most recent labs reviewed. I have personally reviewed and have noted: 1) the patient's medical and social history 2) The patient's current medications and supplements 3) The patient's height, weight, and BMI have been recorded in the chart   Diabetes [facility]) Lab Results  Component Value Date    HGBA1C 8.9 (H) 05/19/2022   Uncontrolled bu improved overall, pt to continue current medical treatment jardianc 25 qd, metformin ER 500 - 4 tab qd, actos 45 mg qd, and increase the mounjaro 5 mg weekly    Essential hypertension BP Readings from Last 3 Encounters:  05/20/22 138/88  01/20/22 130/82  10/04/20 132/88   Stable, pt to continue medical treatment losartan 25 mg qd   Vitamin D deficiency Last vitamin D Lab Results  Component Value Date   VD25OH 21.95 (L) 05/19/2022   Low, to start oral replacement   Hyperlipidemia Lab Results  Component Value Date   LDLCALC 94 05/19/2022   Uncontrolled,  pt to continue current statin crestor 40 mg and fenofibrate 160 qd and lower chol diet   Neck pain on left side Mild intermittent, can't r/o radiculitis like pain vs other msk - mild intemittent , for tylenol prn and  to f/u any worsening symptoms or concerns   Cerumen impaction Ceruminosis is noted.  Wax is removed by syringing and manual debridement. Instructions for home care to prevent wax buildup are given.  Followup: Return in about 6 months (around 11/18/2022).  Cathlean Cower, MD 05/21/2022 9:37 PM [facility]

## 2022-05-20 NOTE — Progress Notes (Signed)
PRE-PROCEDURE EXAM: Bilateral TM cannot be visualized due to total occlusion/impaction of the ear canal.  PROCEDURE INDICATION: remove wax to visualize ear drum & relieve discomfort  CONSENT:  Verbal     PROCEDURE NOTE:     Bilateral  EAR:  I used warm water irrigation under direct visualization with the otoscope to free the wax bolus from the ear canal.    POST- PROCEDURE EXAM: Bilateral TMs successfully visualized and found to have no erythema     The patient tolerated the procedure well.   

## 2022-05-21 ENCOUNTER — Encounter: Payer: Self-pay | Admitting: Internal Medicine

## 2022-05-21 DIAGNOSIS — H6120 Impacted cerumen, unspecified ear: Secondary | ICD-10-CM | POA: Insufficient documentation

## 2022-05-21 DIAGNOSIS — M542 Cervicalgia: Secondary | ICD-10-CM | POA: Insufficient documentation

## 2022-05-21 NOTE — Assessment & Plan Note (Signed)
Last vitamin D Lab Results  Component Value Date   VD25OH 21.95 (L) 05/19/2022   Low, to start oral replacement

## 2022-05-21 NOTE — Assessment & Plan Note (Signed)
BP Readings from Last 3 Encounters:  05/20/22 138/88  01/20/22 130/82  10/04/20 132/88   Stable, pt to continue medical treatment losartan 25 mg qd

## 2022-05-21 NOTE — Assessment & Plan Note (Signed)
Lab Results  Component Value Date   LDLCALC 94 05/19/2022   Uncontrolled,  pt to continue current statin crestor 40 mg and fenofibrate 160 qd and lower chol diet

## 2022-05-21 NOTE — Assessment & Plan Note (Signed)
Lab Results  Component Value Date   HGBA1C 8.9 (H) 05/19/2022   Uncontrolled bu improved overall, pt to continue current medical treatment jardianc 25 qd, metformin ER 500 - 4 tab qd, actos 45 mg qd, and increase the mounjaro 5 mg weekly

## 2022-05-21 NOTE — Assessment & Plan Note (Signed)
Ceruminosis is noted.  Wax is removed by syringing and manual debridement. Instructions for home care to prevent wax buildup are given.  

## 2022-05-21 NOTE — Assessment & Plan Note (Signed)
Age and sex appropriate education and counseling updated with regular exercise and diet Referrals for preventative services - none needed Immunizations addressed - for shingrix at pharmacy, declines covid booster Smoking counseling  - none needed Evidence for depression or other mood disorder - none significant Most recent labs reviewed. I have personally reviewed and have noted: 1) the patient's medical and social history 2) The patient's current medications and supplements 3) The patient's height, weight, and BMI have been recorded in the chart  

## 2022-05-21 NOTE — Assessment & Plan Note (Signed)
Mild intermittent, can't r/o radiculitis like pain vs other msk - mild intemittent , for tylenol prn and  to f/u any worsening symptoms or concerns

## 2022-08-10 ENCOUNTER — Other Ambulatory Visit: Payer: Self-pay | Admitting: Podiatry

## 2022-08-19 ENCOUNTER — Other Ambulatory Visit: Payer: Self-pay | Admitting: Internal Medicine

## 2022-09-07 MED ORDER — TIRZEPATIDE 7.5 MG/0.5ML ~~LOC~~ SOAJ
7.5000 mg | SUBCUTANEOUS | 3 refills | Status: DC
Start: 2022-09-07 — End: 2022-10-20

## 2022-09-07 NOTE — Telephone Encounter (Signed)
Ok to let pt know, the mounjaro 5 mg is on back order  So we can try to increase to the 7.5 mg weekly, as this may be easier to get at the pharmacy - done erx  Ok to continue all other meds, as this should continue to also help get the A1c down that was better but still high in jan 2024 at his last visit

## 2022-10-20 ENCOUNTER — Encounter: Payer: Self-pay | Admitting: Internal Medicine

## 2022-10-20 ENCOUNTER — Ambulatory Visit (INDEPENDENT_AMBULATORY_CARE_PROVIDER_SITE_OTHER): Payer: 59 | Admitting: Internal Medicine

## 2022-10-20 ENCOUNTER — Other Ambulatory Visit: Payer: Self-pay | Admitting: Internal Medicine

## 2022-10-20 ENCOUNTER — Other Ambulatory Visit (INDEPENDENT_AMBULATORY_CARE_PROVIDER_SITE_OTHER): Payer: 59

## 2022-10-20 VITALS — BP 130/82 | HR 90 | Temp 97.9°F | Ht 72.0 in | Wt 244.0 lb

## 2022-10-20 DIAGNOSIS — R202 Paresthesia of skin: Secondary | ICD-10-CM | POA: Diagnosis not present

## 2022-10-20 DIAGNOSIS — Z7985 Long-term (current) use of injectable non-insulin antidiabetic drugs: Secondary | ICD-10-CM | POA: Diagnosis not present

## 2022-10-20 DIAGNOSIS — Z1159 Encounter for screening for other viral diseases: Secondary | ICD-10-CM

## 2022-10-20 DIAGNOSIS — E119 Type 2 diabetes mellitus without complications: Secondary | ICD-10-CM

## 2022-10-20 DIAGNOSIS — E559 Vitamin D deficiency, unspecified: Secondary | ICD-10-CM

## 2022-10-20 DIAGNOSIS — E1165 Type 2 diabetes mellitus with hyperglycemia: Secondary | ICD-10-CM

## 2022-10-20 DIAGNOSIS — Z7984 Long term (current) use of oral hypoglycemic drugs: Secondary | ICD-10-CM

## 2022-10-20 DIAGNOSIS — I10 Essential (primary) hypertension: Secondary | ICD-10-CM

## 2022-10-20 DIAGNOSIS — E7800 Pure hypercholesterolemia, unspecified: Secondary | ICD-10-CM

## 2022-10-20 DIAGNOSIS — E538 Deficiency of other specified B group vitamins: Secondary | ICD-10-CM

## 2022-10-20 DIAGNOSIS — Z125 Encounter for screening for malignant neoplasm of prostate: Secondary | ICD-10-CM

## 2022-10-20 LAB — URINALYSIS, ROUTINE W REFLEX MICROSCOPIC
Bilirubin Urine: NEGATIVE
Hgb urine dipstick: NEGATIVE
Ketones, ur: NEGATIVE
Nitrite: NEGATIVE
Specific Gravity, Urine: 1.02 (ref 1.000–1.030)
Total Protein, Urine: NEGATIVE
Urine Glucose: NEGATIVE
Urobilinogen, UA: 0.2 (ref 0.0–1.0)
pH: 6 (ref 5.0–8.0)

## 2022-10-20 LAB — MICROALBUMIN / CREATININE URINE RATIO
Creatinine,U: 192.5 mg/dL
Microalb Creat Ratio: 0.4 mg/g (ref 0.0–30.0)
Microalb, Ur: <0.7 mg/dL (ref 0.0–1.9)

## 2022-10-20 LAB — BASIC METABOLIC PANEL
BUN: 10 mg/dL (ref 6–23)
CO2: 26 mEq/L (ref 19–32)
Calcium: 8.7 mg/dL (ref 8.4–10.5)
Chloride: 100 mEq/L (ref 96–112)
Creatinine, Ser: 0.71 mg/dL (ref 0.40–1.50)
GFR: 105.92 mL/min (ref >60.00)
Glucose, Bld: 154 mg/dL — ABNORMAL HIGH (ref 70–99)
Potassium: 3.5 mEq/L (ref 3.5–5.1)
Sodium: 136 mEq/L (ref 135–145)

## 2022-10-20 LAB — HEPATIC FUNCTION PANEL
ALT: 20 U/L (ref 0–53)
AST: 16 U/L (ref 0–37)
Albumin: 4 g/dL (ref 3.5–5.2)
Alkaline Phosphatase: 54 U/L (ref 39–117)
Bilirubin, Direct: 0.1 mg/dL (ref 0.0–0.3)
Total Bilirubin: 0.4 mg/dL (ref 0.2–1.2)
Total Protein: 7.1 g/dL (ref 6.0–8.3)

## 2022-10-20 LAB — LIPID PANEL
Cholesterol: 134 mg/dL (ref 0–200)
HDL: 28.9 mg/dL — ABNORMAL LOW (ref >39.00)
NonHDL: 104.85
Total CHOL/HDL Ratio: 5
Triglycerides: 215 mg/dL — ABNORMAL HIGH (ref 0.0–149.0)
VLDL: 43 mg/dL — ABNORMAL HIGH (ref 0.0–40.0)

## 2022-10-20 LAB — HEMOGLOBIN A1C: Hgb A1c MFr Bld: 6.8 % — ABNORMAL HIGH (ref 4.6–6.5)

## 2022-10-20 LAB — LDL CHOLESTEROL, DIRECT: Direct LDL: 68 mg/dL

## 2022-10-20 LAB — VITAMIN D 25 HYDROXY (VIT D DEFICIENCY, FRACTURES): VITD: 19.98 ng/mL — ABNORMAL LOW (ref 30.00–100.00)

## 2022-10-20 MED ORDER — FLUOCINONIDE EMULSIFIED BASE 0.05 % EX CREA: 1.0000 | TOPICAL_CREAM | Freq: Two times a day (BID) | CUTANEOUS | 1 refills | Status: DC

## 2022-10-20 MED ORDER — EMPAGLIFLOZIN 25 MG PO TABS: 25.0000 mg | ORAL_TABLET | Freq: Every day | ORAL | 11 refills | Status: DC

## 2022-10-20 MED ORDER — PIOGLITAZONE HCL 30 MG PO TABS: 30.0000 mg | ORAL_TABLET | Freq: Every day | ORAL | 3 refills | Status: DC

## 2022-10-20 MED ORDER — TIRZEPATIDE 7.5 MG/0.5ML ~~LOC~~ SOAJ: 7.5000 mg | SUBCUTANEOUS | 3 refills | Status: DC

## 2022-10-20 MED ORDER — GABAPENTIN 300 MG PO CAPS
600.0000 mg | ORAL_CAPSULE | Freq: Three times a day (TID) | ORAL | 1 refills | Status: DC
Start: 2022-10-20 — End: 2023-01-11

## 2022-10-20 NOTE — Assessment & Plan Note (Signed)
Lab Results  Component Value Date   CHOL 134 10/20/2022   HDL 28.90 (L) 10/20/2022   LDLCALC 94 05/19/2022   LDLDIRECT 68.0 10/20/2022   TRIG 215.0 (H) 10/20/2022   CHOLHDL 5 10/20/2022  Much improved, to continue crestor 40 mg qd

## 2022-10-20 NOTE — Assessment & Plan Note (Signed)
Last vitamin D Lab Results  Component Value Date   VD25OH 19.98 (L) 10/20/2022   Low, to start oral replacement

## 2022-10-20 NOTE — Assessment & Plan Note (Signed)
Lab Results  Component Value Date   HGBA1C 6.8 (H) 10/20/2022   Much improved with wt loss and mounjaro,  pt to continue current medical treatment metformin ER 500 m - 4 every day, ok to increase the mounjaro to 7.5 mg weekly, and reduce actos to 30 mg every day, also continue jardiance 25 qd

## 2022-10-20 NOTE — Assessment & Plan Note (Signed)
I suspect neuropathy - for gabapentin 600 tid, also refer neurology to consider NCS EMG

## 2022-10-20 NOTE — Assessment & Plan Note (Signed)
BP Readings from Last 3 Encounters:  10/20/22 130/82  05/20/22 138/88  01/20/22 130/82   Stable, pt to continue medical treatment losartan 25 mg qd

## 2022-10-20 NOTE — Progress Notes (Signed)
Patient ID: Dylan [name], male   DOB: 11/16/1970, 52 y.o.   MRN: [id number]        Chief Complaint: follow up HTN, HLD and DM and worsening paresthesias       HPI:  Dylan [name] is a 52 y.o. male here with c/o hands and feet painful paresthesias, better after Did see podiatry with bilateral feet and also hand pain, some improved with gabapentin per podiatry who has tx up to 600 mg at bedtime.  Plans to see eye doctor soon.  Please have your Shingrix (shingles) shots done at your local pharmacy.  Has lost significant wt with mounjaro 5 mg, willing to increase as has had no significant GI effects.  Pt denies chest pain, increased sob or doe, wheezing, orthopnea, PND, increased LE swelling, palpitations, dizziness or syncope.   Pt denies polydipsia, polyuria, or new focal neuro s/s.   Does also have worsening eczema rash to distal lateral right leg and bilateral dorsal feet.   Wt Readings from Last 3 Encounters:  10/20/22 244 lb (110.7 kg)  05/20/22 252 lb 8 oz (114.5 kg)  01/20/22 260 lb (117.9 kg)   BP Readings from Last 3 Encounters:  10/20/22 130/82  05/20/22 138/88  01/20/22 130/82         Past Medical History:  Diagnosis Date   ALLERGIC RHINITIS 11/25/2007   ASTHMA 11/25/2007   Asthma    CARPAL TUNNEL SYNDROME, LEFT, MILD 11/25/2007   DIABETES MELLITUS, TYPE II 11/25/2007   Femur fracture, left (HCC) 04/07/2017   HYPERLIPIDEMIA 11/25/2007   Metabolic syndrome 04/09/2017   NECK PAIN 08/27/2009   Obesity    Other specified forms of hearing loss 05/10/2009   Other symptoms referable to lower leg joint 11/25/2007   SHOULDER PAIN, LEFT 08/27/2009   Unspecified hearing loss 04/25/2008   Vitamin D deficiency 04/09/2017   Past Surgical History:  Procedure Laterality Date   BACK SURGERY     FEMUR IM NAIL Left 04/08/2017   Procedure: INTRAMEDULLARY (IM) RETROGRADE FEMORAL NAILING;  Surgeon: Myrene Galas, MD;  Location: [facility] OR;  Service: Orthopedics;  Laterality: Left;   s/p back  surgury  2002   Lumbar disc    reports that he has never smoked. He has never used smokeless tobacco. He reports that he does not drink alcohol and does not use drugs. family history includes Diabetes in an other family member. Allergies  Allergen Reactions   Penicillins Other (See Comments)    From childhood: Has patient had a PCN reaction causing immediate rash, facial/tongue/throat swelling, SOB or lightheadedness with hypotension: Unk Has patient had a PCN reaction causing severe rash involving mucus membranes or skin necrosis: Unk Has patient had a PCN reaction that required hospitalization: Unk Has patient had a PCN reaction occurring within the last 10 years: No If all of the above answers are "NO", then may proceed with Cephalosporin use.    Current Outpatient Medications on File Prior to Visit  Medication Sig Dispense Refill   acetaminophen (TYLENOL) 325 MG tablet Take 650 mg by mouth every 6 (six) hours as needed for moderate pain.     Cholecalciferol 50 MCG (2000 UT) CAPS      cholecalciferol 5000 units TABS Take 0.4 tablets (2,000 Units total) by mouth daily. 30 tablet 2   Continuous Blood Gluc Receiver (FREESTYLE LIBRE 2 READER) DEVI Use as directed twice daily E11.9 1 each 2   Continuous Blood Gluc Sensor (FREESTYLE LIBRE 2 SENSOR) MISC Use as directed  twice per day E11.9 6 each 3   fenofibrate 160 MG tablet Take 1 tablet (160 mg total) by mouth daily. 90 tablet 3   Lancets Misc. MISC Use as directed twice daily 200 each 11   losartan (COZAAR) 25 MG tablet Take 1 tablet (25 mg total) by mouth daily. 90 tablet 3   metFORMIN (GLUCOPHAGE-XR) 500 MG 24 hr tablet Take 4 tablets (2,000 mg total) by mouth daily with breakfast. 360 tablet 3   rosuvastatin (CRESTOR) 40 MG tablet Take 1 tablet (40 mg total) by mouth daily. 90 tablet 3   No current facility-administered medications on file prior to visit.        ROS:  All others reviewed and negative.  Objective        PE:  BP  130/82 (BP Location: Right Arm, Patient Position: Sitting, Cuff Size: Normal)   Pulse 90   Temp 97.9 F (36.6 C) (Oral)   Ht 6' (1.829 m)   Wt 244 lb (110.7 kg)   SpO2 97%   BMI 33.09 kg/m                 Constitutional: Pt appears in NAD               HENT: Head: NCAT.                Right Ear: External ear normal.                 Left Ear: External ear normal.                Eyes: . Pupils are equal, round, and reactive to light. Conjunctivae and EOM are normal               Nose: without d/c or deformity               Neck: Neck supple. Gross normal ROM               Cardiovascular: Normal rate and regular rhythm.                 Pulmonary/Chest: Effort normal and breath sounds without rales or wheezing.                Abd:  Soft, NT, ND, + BS, no organomegaly               Neurological: Pt is alert. At baseline orientation, motor grossly intact               Skin: Skin is warm. No rashes, no other new lesions, LE edema - none               Psychiatric: Pt behavior is normal without agitation   Micro: none  Cardiac tracings I have personally interpreted today:  none  Pertinent Radiological findings (summarize): none   Lab Results  Component Value Date   WBC 4.5 05/19/2022   HGB 14.8 05/19/2022   HCT 42.3 05/19/2022   PLT 196.0 05/19/2022   GLUCOSE 154 (H) 10/20/2022   CHOL 134 10/20/2022   TRIG 215.0 (H) 10/20/2022   HDL 28.90 (L) 10/20/2022   LDLDIRECT 68.0 10/20/2022   LDLCALC 94 05/19/2022   ALT 20 10/20/2022   AST 16 10/20/2022   NA 136 10/20/2022   K 3.5 10/20/2022   CL 100 10/20/2022   CREATININE 0.71 10/20/2022   BUN 10 10/20/2022   CO2 26 10/20/2022   TSH 2.80 05/19/2022  PSA 0.54 05/19/2022   INR 1.03 04/07/2017   HGBA1C 6.8 (H) 10/20/2022   MICROALBUR <0.7 05/19/2022   Assessment/Plan:  Dylan [name] is a 52 y.o. Black or African American [2] male with  has a past medical history of ALLERGIC RHINITIS (11/25/2007), ASTHMA (11/25/2007), Asthma,  CARPAL TUNNEL SYNDROME, LEFT, MILD (11/25/2007), DIABETES MELLITUS, TYPE II (11/25/2007), Femur fracture, left ([facility]) (04/07/2017), HYPERLIPIDEMIA (11/25/2007), Metabolic syndrome (04/09/2017), NECK PAIN (08/27/2009), Obesity, Other specified forms of hearing loss (05/10/2009), Other symptoms referable to lower leg joint (11/25/2007), SHOULDER PAIN, LEFT (08/27/2009), Unspecified hearing loss (04/25/2008), and Vitamin D deficiency (04/09/2017).  Hyperlipidemia Lab Results  Component Value Date   CHOL 134 10/20/2022   HDL 28.90 (L) 10/20/2022   LDLCALC 94 05/19/2022   LDLDIRECT 68.0 10/20/2022   TRIG 215.0 (H) 10/20/2022   CHOLHDL 5 10/20/2022  Much improved, to continue crestor 40 mg qd   Essential hypertension BP Readings from Last 3 Encounters:  10/20/22 130/82  05/20/22 138/88  01/20/22 130/82   Stable, pt to continue medical treatment losartan 25 mg qd   Diabetes (HCC) Lab Results  Component Value Date   HGBA1C 6.8 (H) 10/20/2022   Much improved with wt loss and mounjaro,  pt to continue current medical treatment metformin ER 500 m - 4 every day, ok to increase the mounjaro to 7.5 mg weekly, and reduce actos to 30 mg every day, also continue jardiance 25 qd   Vitamin D deficiency Last vitamin D Lab Results  Component Value Date   VD25OH 19.98 (L) 10/20/2022   Low, to start oral replacement   Paresthesias I suspect neuropathy - for gabapentin 600 tid, also refer neurology to consider NCS EMG  Followup: Return in about 6 months (around 04/21/2023).  Oliver Barre, MD 10/20/2022 1:54 PM [facility]

## 2022-10-20 NOTE — Patient Instructions (Addendum)
Please take all new medication as prescribed  - the gabapentin to 600 mg three times per day  Please take all new medication as prescribed - the cream for the rash  Ok to increase the mounjaro to 7.5 mg weekly  Ok to decrease the actos to 30 mg per day  Please continue all other medications as before, and refills have been done if requested - the monthly jardiance script  Please have the pharmacy call with any other refills you may need.  Please continue your efforts at being more active, low cholesterol diet, and weight control.  You are otherwise up to date with prevention measures today.  Please keep your appointments with your specialists as you may have planned  You will be contacted regarding the referral for: Neurology and the nerve testing   Please make an Appointment to return in 6 months, or sooner if needed, also with Lab Appointment for testing done 3-5 days before at the FIRST FLOOR Lab (so this is for TWO appointments - please see the scheduling desk as you leave)

## 2022-10-21 LAB — HEPATITIS C ANTIBODY: Hepatitis C Ab: NONREACTIVE

## 2022-10-29 ENCOUNTER — Other Ambulatory Visit: Payer: Self-pay | Admitting: Internal Medicine

## 2023-01-09 ENCOUNTER — Other Ambulatory Visit: Payer: Self-pay | Admitting: Internal Medicine

## 2023-01-11 ENCOUNTER — Other Ambulatory Visit: Payer: Self-pay

## 2023-01-21 ENCOUNTER — Ambulatory Visit: Payer: 59 | Admitting: Neurology

## 2023-01-27 ENCOUNTER — Ambulatory Visit (INDEPENDENT_AMBULATORY_CARE_PROVIDER_SITE_OTHER): Payer: 59 | Admitting: Neurology

## 2023-01-27 ENCOUNTER — Encounter: Payer: Self-pay | Admitting: Neurology

## 2023-01-27 VITALS — BP 140/99 | HR 86 | Ht 72.0 in | Wt 235.8 lb

## 2023-01-27 DIAGNOSIS — R202 Paresthesia of skin: Secondary | ICD-10-CM

## 2023-01-27 DIAGNOSIS — R258 Other abnormal involuntary movements: Secondary | ICD-10-CM

## 2023-01-27 DIAGNOSIS — R292 Abnormal reflex: Secondary | ICD-10-CM

## 2023-01-27 DIAGNOSIS — R2990 Unspecified symptoms and signs involving the nervous system: Secondary | ICD-10-CM

## 2023-01-27 DIAGNOSIS — R200 Anesthesia of skin: Secondary | ICD-10-CM | POA: Diagnosis not present

## 2023-01-27 DIAGNOSIS — M542 Cervicalgia: Secondary | ICD-10-CM

## 2023-01-27 DIAGNOSIS — G8929 Other chronic pain: Secondary | ICD-10-CM

## 2023-01-27 MED ORDER — GABAPENTIN 300 MG PO CAPS: 900.0000 mg | ORAL_CAPSULE | Freq: Two times a day (BID) | ORAL | 1 refills | Status: DC

## 2023-01-27 NOTE — Patient Instructions (Addendum)
MRi of the cervical spine Blood work EMG/NCS  -May consider daily alpha lipoic acid which is an antioxidant that may reduce free radical oxidative stress associated with diabetic polyneuropathy, existing evidence suggests that alpha lipoic acid significantly reduces stabbing, lancinating and burning pain and diabetic neuropathy with its onset of action as early as 1-2 weeks or may take several months 400-600mg  twice daily  - refill gabapentin capsules only   Peripheral Neuropathy Peripheral neuropathy is a type of nerve damage. It affects nerves that carry signals between the spinal cord and the arms, legs, and the rest of the body (peripheral nerves). It does not affect nerves in the spinal cord or brain. In peripheral neuropathy, one nerve or a group of nerves may be damaged. Peripheral neuropathy is a broad category that includes many specific nerve disorders, like diabetic neuropathy, hereditary neuropathy, and carpal tunnel syndrome. What are the causes? This condition may be caused by: Certain diseases, such as: Diabetes. This is the most common cause of peripheral neuropathy. Autoimmune diseases, such as rheumatoid arthritis and systemic lupus erythematosus. Nerve diseases that are passed from parent to child (inherited). Kidney disease. Thyroid disease. Other causes may include: Nerve injury. Pressure or stress on a nerve that lasts a long time. Lack (deficiency) of B vitamins. This can result from alcoholism, poor diet, or a restricted diet. Infections. Some medicines, such as cancer medicines (chemotherapy). Poisonous (toxic) substances, such as lead and mercury. Too little blood flowing to the legs. In some cases, the cause of this condition is not known. What are the signs or symptoms? Symptoms of this condition depend on which of your nerves is damaged. Symptoms in the legs, hands, and arms can include: Loss of feeling (numbness) in the feet, hands, or both. Tingling in  the feet, hands, or both. Burning pain. Very sensitive skin. Weakness. Not being able to move a part of the body (paralysis). Clumsiness or poor coordination. Muscle twitching. Loss of balance. Symptoms in other parts of the body can include: Not being able to control your bladder. Feeling dizzy. Sexual problems. How is this diagnosed? Diagnosing and finding the cause of peripheral neuropathy can be difficult. Your health care provider will take your medical history and do a physical exam. A neurological exam will also be done. This involves checking things that are affected by your brain, spinal cord, and nerves (nervous system). For example, your health care provider will check your reflexes, how you move, and what you can feel. You may have other tests, such as: Blood tests. Electromyogram (EMG) and nerve conduction tests. These tests check nerve function and how well the nerves are controlling the muscles. Imaging tests, such as a CT scan or MRI, to rule out other causes of your symptoms. Removing a small piece of nerve to be examined in a lab (nerve biopsy). Removing and examining a small amount of the fluid that surrounds the brain and spinal cord (lumbar puncture). How is this treated? Treatment for this condition may involve: Treating the underlying cause of the neuropathy, such as diabetes, kidney disease, or vitamin deficiencies. Stopping medicines that can cause neuropathy, such as chemotherapy. Medicine to help relieve pain. Medicines may include: Prescription or over-the-counter pain medicine. Anti-seizure medicine. Antidepressants. Pain-relieving patches that are applied to painful areas of skin. Surgery to relieve pressure on a nerve or to destroy a nerve that is causing pain. Physical therapy to help improve movement and balance. Devices to help you move around (assistive devices). Follow these instructions at  home: Medicines Take over-the-counter and prescription  medicines only as told by your health care provider. Do not take any other medicines without first asking your health care provider. Ask your health care provider if the medicine prescribed to you requires you to avoid driving or using machinery. Lifestyle  Do not use any products that contain nicotine or tobacco. These products include cigarettes, chewing tobacco, and vaping devices, such as e-cigarettes. Smoking keeps blood from reaching damaged nerves. If you need help quitting, ask your health care provider. Avoid or limit alcohol. Too much alcohol can cause a vitamin B deficiency, and vitamin B is needed for healthy nerves. Eat a healthy diet. This includes: Eating foods that are high in fiber, such as beans, whole grains, and fresh fruits and vegetables. Limiting foods that are high in fat and processed sugars, such as fried or sweet foods. General instructions  If you have diabetes, work closely with your health care provider to keep your blood sugar under control. If you have numbness in your feet: Check every day for signs of injury or infection. Watch for redness, warmth, and swelling. Wear padded socks and comfortable shoes. These help protect your feet. Develop a good support system. Living with peripheral neuropathy can be stressful. Consider talking with a mental health specialist or joining a support group. Use assistive devices and attend physical therapy as told by your health care provider. This may include using a walker or a cane. Keep all follow-up visits. This is important. Where to find more information [employer] [facility]: [employer] Contact a health care provider if: You have new signs or symptoms of peripheral neuropathy. You are struggling emotionally from dealing with peripheral neuropathy. Your pain is not well controlled. Get help right away if: You have an injury or infection that is not healing normally. You develop new  weakness in an arm or leg. You have fallen or do so frequently. Summary Peripheral neuropathy is when the nerves in the arms or legs are damaged, resulting in numbness, weakness, or pain. There are many causes of peripheral neuropathy, including diabetes, pinched nerves, vitamin deficiencies, autoimmune disease, and hereditary conditions. Diagnosing and finding the cause of peripheral neuropathy can be difficult. Your health care provider will take your medical history, do a physical exam, and do tests, including blood tests and nerve function tests. Treatment involves treating the underlying cause of the neuropathy and taking medicines to help control pain. Physical therapy and assistive devices may also help. This information is not intended to replace advice given to you by your health care provider. Make sure you discuss any questions you have with your health care provider. Document Revised: 12/17/2020 Document Reviewed: 12/17/2020 [employer] Patient Education  2024 [employer]  Electromyoneurogram Electromyoneurogram is a test to check how well your muscles and nerves are working. This procedure includes the combined use of electromyogram (EMG) and nerve conduction study (NCS). EMG is used to evaluate muscles and the nerves that control those muscles. NCS, which is also called electroneurogram, measures how well your nerves conduct electricity. The procedures should be done together to check if your muscles and nerves are healthy. If the results of the tests are abnormal, this may indicate disease or injury, such as a neuromuscular disease or peripheral nerve damage. Tell a health care provider about: Any allergies you have. All medicines you are taking, including vitamins, herbs, eye drops, creams, and over-the-counter medicines. Any bleeding problems you have. Any surgeries you have had. Any medical conditions  you have. What are the risks? Generally, this is a safe procedure. However,  problems may occur, including: Bleeding or bruising. Infection where the electrodes were inserted. What happens before the test? Medicines Take all of your usually prescribed medications before this testing is performed. Do not stop your blood thinners unless advised by your prescribing physician. General instructions Your health care provider may ask you to warm the limb that will be checked with warm water, hot pack, or wrapping the limb in a blanket. Do not use lotions or creams on the same day that you will be having the procedure. What happens during the test? For EMG  Your health care provider will ask you to stay in a position so that the muscle being studied can be accessed. You will be sitting or lying down. You may be given a medicine to numb the area (local anesthetic) and the skin will be disinfected. A very thin needle that has an electrode will be inserted into your muscle, one muscle at a time. Typically, multiple muscles are evaluated during a single study. Another small electrode will be placed on your skin near the muscle. Your health care provider will ask you to continue to remain still. The electrodes will record the electrical activity of your muscles. You may see this on a monitor or hear it in the room. After your muscles have been studied at rest, your health care provider will ask you to contract or flex your muscles. The electrodes will record the electrical activity of your muscles. Your health care provider will remove the electrodes and the electrode needle when the procedure is finished. The procedure may vary among health care providers and hospitals. For NCS  An electrode that records your nerve activity (recording electrode) will be placed on your skin by the muscle that is being studied. An electrode that is used as a reference (reference electrode) will be placed near the recording electrode. A paste or gel will be applied to your skin between the  recording electrode and the reference electrode. Your nerve will be stimulated with a mild shock. The speed of the nerves and strength of response is recorded by the electrodes. Your health care provider will remove the electrodes and the gel when the procedure is finished. The procedure may vary among health care providers and hospitals. What can I expect after the test? It is up to you to get your test results. Ask your health care provider, or the department that is doing the test, when your results will be ready. Your health care provider may: Give you medicines for any pain. Monitor the insertion sites to make sure that bleeding stops. You should be able to drive yourself to and from the test. Discomfort can persist for a few hours after the test, but should be better the next day. Contact a health care provider if: You have swelling, redness, or drainage at any of the insertion sites. Summary Electromyoneurogram is a test to check how well your muscles and nerves are working. If the results of the tests are abnormal, this may indicate disease or injury. This is a safe procedure. However, problems may occur, such as bleeding and infection. Your health care provider will do two tests to complete this procedure. One checks your muscles (EMG) and another checks your nerves (NCS). It is up to you to get your test results. Ask your health care provider, or the department that is doing the test, when your results will be ready. This  information is not intended to replace advice given to you by your health care provider. Make sure you discuss any questions you have with your health care provider. Document Revised: 12/25/2020 Document Reviewed: 11/24/2020 [employer] Patient Education  2024 [employer].

## 2023-01-27 NOTE — Progress Notes (Signed)
[facility]    Provider:  Dr Lucia Gaskins Requesting Provider: Corwin Levins, MD Primary Care Provider:  Corwin Levins, MD  CC:  paresthesias  HPI:  Dylan [name] is a 52 y.o. male here as requested by Corwin Levins, MD for paresthesias. PMHx has Hyperlipidemia; CARPAL TUNNEL SYNDROME, LEFT, MILD; Other specified forms of hearing loss; Essential hypertension; ALLERGIC RHINITIS; Asthma; SHOULDER PAIN, LEFT; Other symptoms referable to lower leg joint; NECK PAIN; Encounter for well adult exam with abnormal findings; Bilateral hearing loss; Psychosis (HCC); Diabetes (HCC); Hearing loss, left; Bilateral knee pain; Left facial swelling; Mood altered; Dysuria; Obesity; Femur fracture, left (HCC); Vitamin D deficiency; Metabolic syndrome; Cervical radiculopathy; Febrile illness; COVID-19 virus infection; DDD (degenerative disc disease), cervical; Facial mass; Impacted cerumen, bilateral; Neck pain on left side; Cerumen impaction; and Paresthesias on their problem list.  I reviewed Dr. Raphael Gibney notes from June 2024, complaining of hands and feet with painful paresthesias, better after seeing podiatry some improvement with gabapentin, has lost significant weight with Mounjaro, physical exam appears normal from notes, last hemoglobin A1c was 6.8, vitamin D 25-hydroxy was very low at 19.98, on prior note January 2024 she also stated she had mild to moderate recurrent dull left neck pain with radiation to the left upper back maybe to the left upper arm as well but reported no upper extremity weakness, her hemoglobin A1c at that time was 8.9 uncontrolled and her vitamin D was still low 21.95.  She has had surgery on her left lower back with orthopedics in 2002,  About 6 months ago symptoms started in all the fingers. He can feel it all the time in the fingers, the tips, in the fingers digits 1-5. Stays in the fingertips. Nothing makes it better or worse. Gabapentin helps. No radiation. Not dropping  things. No weakness in the hands. No neck pain or shooting pain the arms, no cervical radicular symptoms. In all the toes in the tips. Always there. Nothing makes it better or worse. He has back pain but not associated and no radicular symptoms. Gabapentin helps the feet as well. No balance problems, no problems walking, no weakness in the legs or feet. Symmetrical, one side is not worse. Not progressing, not worsening in severity or spreading up the arms and legs. No fhx of nerve diseases. Not concurrent with any event or new medication or illness or vaccine just came out of nowhere. He has had pain in the elbow at the ulnar site in the past. No alcohol use. No other focal neurologic deficits, associated symptoms, inciting events or modifiable factors.    Reviewed notes, labs and imaging from outside physicians, which showed:  03/2017: CT cervical spine: CLINICAL DATA:  Status post fall   EXAM: CT CERVICAL SPINE WITHOUT CONTRAST   TECHNIQUE: Multidetector CT imaging of the cervical spine was performed without intravenous contrast. Multiplanar CT image reconstructions were also generated.   COMPARISON:  None.   FINDINGS: Alignment: Normal.   Skull base and vertebrae: No acute fracture. No primary bone lesion or focal pathologic process.   Soft tissues and spinal canal: No prevertebral fluid or swelling. No visible canal hematoma.   Disc levels: Disc spaces are maintained. Broad-based disc bulge at C3-4. No foraminal or central canal stenosis.   Upper chest: Lung apices are clear.   Other: No fluid collection or hematoma.   IMPRESSION: 1.  No acute osseous injury of the cervical spine.     Latest Ref Rng & Units 10/20/2022  8:52 AM 05/19/2022    2:41 PM 01/16/2022    2:30 PM  CMP  Glucose 70 - 99 mg/dL 846  962  952   BUN 6 - 23 mg/dL 10  7  14    Creatinine 0.40 - 1.50 mg/dL 8.41  3.24  4.01   Sodium 135 - 145 mEq/L 136  138  137   Potassium 3.5 - 5.1 mEq/L 3.5  4.0  4.0    Chloride 96 - 112 mEq/L 100  102  102   CO2 19 - 32 mEq/L 26  27  26    Calcium 8.4 - 10.5 mg/dL 8.7  8.8  9.0   Total Protein 6.0 - 8.3 g/dL 7.1  7.0  7.2   Total Bilirubin 0.2 - 1.2 mg/dL 0.4  0.6  0.4   Alkaline Phos 39 - 117 U/L 54  50  55   AST 0 - 37 U/L 16  17  14    ALT 0 - 53 U/L 20  21  19        Latest Ref Rng & Units 05/19/2022    2:41 PM 01/16/2022    2:30 PM 10/03/2020    3:01 PM  CBC  WBC 4.0 - 10.5 K/uL 4.5  4.5  5.8   Hemoglobin 13.0 - 17.0 g/dL 02.7  25.3  66.4   Hematocrit 39.0 - 52.0 % 42.3  41.9  41.5   Platelets 150.0 - 400.0 K/uL 196.0  172.0  169.0     Review of Systems: Patient complains of symptoms per HPI as well as the following symptoms none. Pertinent negatives and positives per HPI. All others negative.   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    Employer: [employer]  Tobacco Use   Smoking status: Never   Smokeless tobacco: Never  Vaping Use   Vaping status: Never Used  Substance and Sexual Activity   Alcohol use: No   Drug use: No   Sexual activity: Not on file  Other Topics Concern   Not on file  Social History Narrative   Not on file   Social Determinants of Health   Financial Resource Strain: Not on file  Food Insecurity: Not on file  Transportation Needs: Not on file  Physical Activity: Not on file  Stress: Not on file  Social Connections: Not on file  Intimate Partner Violence: Not on file    Family History  Problem Relation Age of Onset   Diabetes Other     Past Medical History:  Diagnosis Date   ALLERGIC RHINITIS 11/25/2007   ASTHMA 11/25/2007   Asthma    CARPAL TUNNEL SYNDROME, LEFT, MILD 11/25/2007   DIABETES MELLITUS, TYPE II 11/25/2007   Femur fracture, left (HCC) 04/07/2017   HYPERLIPIDEMIA  11/25/2007   Metabolic syndrome 04/09/2017   NECK PAIN 08/27/2009   Obesity    Other specified forms of hearing loss 05/10/2009   Other symptoms referable to lower leg joint 11/25/2007   SHOULDER PAIN, LEFT 08/27/2009   Unspecified hearing loss 04/25/2008   Vitamin D deficiency 04/09/2017    Patient Active Problem List   Diagnosis Date Noted   Paresthesias 10/20/2022   Neck pain on left side 05/21/2022   Cerumen impaction 05/21/2022  Impacted cerumen, bilateral 01/24/2022   COVID-19 virus infection 08/23/2018   Febrile illness 08/18/2018   Cervical radiculopathy 07/28/2017   DDD (degenerative disc disease), cervical 07/13/2017   Vitamin D deficiency 04/09/2017   Metabolic syndrome 04/09/2017   Femur fracture, left (HCC) 04/07/2017   Obesity    Dysuria 01/15/2017   Mood altered 07/10/2016   Facial mass 03/02/2016   Left facial swelling 01/10/2016   Bilateral knee pain 07/05/2015   Psychosis (HCC) 02/07/2013   Diabetes (HCC) 02/07/2013   Hearing loss, left 02/07/2013   Bilateral hearing loss 07/21/2012   Encounter for well adult exam with abnormal findings 12/23/2010   SHOULDER PAIN, LEFT 08/27/2009   NECK PAIN 08/27/2009   Other specified forms of hearing loss 05/10/2009   Essential hypertension 10/25/2008   Hyperlipidemia 11/25/2007   CARPAL TUNNEL SYNDROME, LEFT, MILD 11/25/2007   ALLERGIC RHINITIS 11/25/2007   Asthma 11/25/2007   Other symptoms referable to lower leg joint 11/25/2007    Past Surgical History:  Procedure Laterality Date   BACK SURGERY     FEMUR IM NAIL Left 04/08/2017   Procedure: INTRAMEDULLARY (IM) RETROGRADE FEMORAL NAILING;  Surgeon: Myrene Galas, MD;  Location: [facility] OR;  Service: Orthopedics;  Laterality: Left;   s/p back surgury  2002   Lumbar disc    Current Outpatient Medications  Medication Sig Dispense Refill   acetaminophen (TYLENOL) 325 MG tablet Take 650 mg by mouth every 6 (six) hours as needed for moderate pain.     cholecalciferol  5000 units TABS Take 0.4 tablets (2,000 Units total) by mouth daily. 30 tablet 2   Continuous Blood Gluc Receiver (FREESTYLE LIBRE 2 READER) DEVI Use as directed twice daily E11.9 1 each 2   Continuous Blood Gluc Sensor (FREESTYLE LIBRE 2 SENSOR) MISC Use as directed twice per day E11.9 6 each 3   Lancets Misc. MISC Use as directed twice daily 200 each 11   tirzepatide (MOUNJARO) 7.5 MG/0.5ML Pen Inject 7.5 mg into the skin once a week. 6 mL 3   gabapentin (NEURONTIN) 300 MG capsule Take 3 capsules (900 mg total) by mouth 2 (two) times daily. 540 capsule 1   No current facility-administered medications for this visit.    Allergies as of 01/27/2023 - Review Complete 01/27/2023  Allergen Reaction Noted   Penicillins Other (See Comments)     Vitals: BP (!) 140/99   Pulse 86   Ht 6' (1.829 m)   Wt 235 lb 12.8 oz (107 kg)   BMI 31.98 kg/m  Last Weight:  Wt Readings from Last 1 Encounters:  01/27/23 235 lb 12.8 oz (107 kg)   Last Height:   Ht Readings from Last 1 Encounters:  01/27/23 6' (1.829 m)     Physical exam: Exam: Gen: NAD, conversant, well nourised, obese, well groomed                     CV: RRR, no MRG. No Carotid Bruits. No peripheral edema, warm, nontender Eyes: Conjunctivae clear without exudates or hemorrhage  Neuro: Detailed Neurologic Exam  Speech:    Speech is normal; fluent and spontaneous with normal comprehension.  Cognition:    The patient is oriented to person, place, and time;     recent and remote memory intact;     language fluent;     normal attention, concentration,     fund of knowledge Cranial Nerves:    The pupils are equal, round, and reactive to light. Small pupils, attempted  but could not visualize fundi. Visual fields are full to finger confrontation. Extraocular movements are intact. Trigeminal sensation is intact and the muscles of mastication are normal. The face is symmetric. The palate elevates in the midline. Hearing intact. Voice  is normal. Shoulder shrug is normal. The tongue has normal motion without fasciculations.   Coordination: nml  Gait: nml  Motor Observation:    No asymmetry, no atrophy, and no involuntary movements noted. Tone:    Normal muscle tone.    Posture:    Posture is normal. normal erect    Strength: left deltoid 4/5, right deltoid 4+/5    Strength is V/V in the upper and lower limbs.      Sensation: decreased pp to the ankles in a gradient fashion and decreased in the finger tips     Reflex Exam:  DTR's:    Deep tendon reflexes in the upper and lower extremities are brisk bilaterally.  Right more brisk than left.  Toes:    The toes are equivocal bilaterally.   Clonus: 2 beats clonus right AJ    Assessment/Plan:   52 y/o patient complaining of hands and feet with painful paresthesias, better after seeing podiatry some improvement with gabapentin, has lost significant weight with Mounjaro, physical exam appears normal from notes, last hemoglobin A1c was 6.8, vitamin D 25-hydroxy was very low at 19.98, on prior note January 2024 she also stated she had mild to moderate recurrent dull left neck pain with radiation to the left upper back maybe to the left upper arm as well but reported no upper extremity weakness, her hemoglobin A1c at that time was 8.9 uncontrolled and her vitamin D was still low 21.95.  She has had surgery on her left lower back with orthopedics in 2002,  MRi of the cervical spine Blood work EMG/NCS  -May consider daily alpha lipoic acid which is an antioxidant that may reduce free radical oxidative stress associated with diabetic polyneuropathy, existing evidence suggests that alpha lipoic acid significantly reduces stabbing, lancinating and burning pain and diabetic neuropathy with its onset of action as early as 1-2 weeks or may take several months 400-600mg  twice daily  - refill gabapentin (capsules only cannot swallow pills)  MRi of the cervical spine due to  right > left hyperreflexia and right Aj 2 beats to evaluate for myelopathy, encouraged but he prefers not to at this time Blood work - had B12 completed in June not resulted will ask, may consider vitamin b1 and b6 as well EMG/NCS - patient agreed to testing. May be diabetic cause (hgba1c better controlled after mounjaro but was 8.9 earlier this year) unfortunately you can get a generalized neuropathy from diabets but also from normalization of glucose too fast. May also consider 2 etiologies such as CTS in the upper extremities and/or diabetic polyneuropathy in the lower extremities.   -May consider daily alpha lipoic acid which is an antioxidant that may reduce free radical oxidative stress associated with diabetic polyneuropathy, existing evidence suggests that alpha lipoic acid significantly reduces stabbing, lancinating and burning pain and diabetic neuropathy with its onset of action as early as 1-2 weeks or may take several months 400-600mg  twice daily  - refill gabapentin capsules only  He would only like emg/ncs ordered at this time  Orders Placed This Encounter  Procedures   MR CERVICAL SPINE WO CONTRAST   B12 and Folate Panel   Methylmalonic acid, serum   Vitamin B1   Vitamin B6   NCV with EMG(electromyography)  Meds ordered this encounter  Medications   gabapentin (NEURONTIN) 300 MG capsule    Sig: Take 3 capsules (900 mg total) by mouth 2 (two) times daily.    Dispense:  540 capsule    Refill:  1    Cc: Corwin Levins, MD,  Corwin Levins, MD  Naomie Dean, MD  [facility] [address] [place], [place] [address]  Phone [phone] Fax [phone]

## 2023-01-31 ENCOUNTER — Encounter: Payer: Self-pay | Admitting: Neurology

## 2023-02-01 ENCOUNTER — Telehealth: Payer: Self-pay | Admitting: Internal Medicine

## 2023-02-01 DIAGNOSIS — F32A Depression, unspecified: Secondary | ICD-10-CM

## 2023-02-01 NOTE — Telephone Encounter (Signed)
See below

## 2023-02-01 NOTE — Telephone Encounter (Signed)
Ok this is done 

## 2023-02-01 NOTE — Telephone Encounter (Signed)
Pt wife called wanting Dr. Jonny Ruiz to send another referral to location down below: Please advise  Dr Archer Asa [facility] [address] [place]  [phone]

## 2023-03-09 ENCOUNTER — Telehealth: Payer: Self-pay | Admitting: Neurology

## 2023-03-09 NOTE — Telephone Encounter (Signed)
Pt called to schedule MRI with MRI Coordinator.

## 2023-03-10 ENCOUNTER — Telehealth: Payer: Self-pay | Admitting: Neurology

## 2023-03-10 NOTE — Telephone Encounter (Signed)
Pt scheduled for 30 mins MR cervical spine wo contrast at [facility] for 03/17/23 at 12:30pm  [facility] auth# [id number] (02/03/23-03/20/23)

## 2023-03-10 NOTE — Telephone Encounter (Signed)
[name] called him back today and got him scheduled.

## 2023-03-17 ENCOUNTER — Ambulatory Visit: Payer: 59

## 2023-03-17 DIAGNOSIS — G8929 Other chronic pain: Secondary | ICD-10-CM

## 2023-03-17 DIAGNOSIS — R2990 Unspecified symptoms and signs involving the nervous system: Secondary | ICD-10-CM

## 2023-03-17 DIAGNOSIS — R292 Abnormal reflex: Secondary | ICD-10-CM | POA: Diagnosis not present

## 2023-03-17 DIAGNOSIS — R258 Other abnormal involuntary movements: Secondary | ICD-10-CM | POA: Diagnosis not present

## 2023-03-17 DIAGNOSIS — R202 Paresthesia of skin: Secondary | ICD-10-CM

## 2023-03-17 DIAGNOSIS — R200 Anesthesia of skin: Secondary | ICD-10-CM

## 2023-03-17 DIAGNOSIS — M542 Cervicalgia: Secondary | ICD-10-CM

## 2023-03-22 ENCOUNTER — Telehealth: Payer: Self-pay | Admitting: Neurology

## 2023-03-22 DIAGNOSIS — G959 Disease of spinal cord, unspecified: Secondary | ICD-10-CM

## 2023-03-22 DIAGNOSIS — M4802 Spinal stenosis, cervical region: Secondary | ICD-10-CM

## 2023-03-22 NOTE — Telephone Encounter (Signed)
Pod4: Will you call and ask her if we can send a referral to Neurosurgery for her spine? Also ask her if we can postpone the emg/ncs, we may not need it given the results of the cervical spine and I'd like her to see neurosurgery first. If she decides to postpone the emg/ncs please put it on hold for me to decide who to put it in I have a few people waiting for a sooner emg/ncs. Let me know thanks see phone note. And it appears she has signal in the cord, she may need decompression at that level where she has moderate spinal stenosis because it appears to be affecting her spinal cord thanks   Hi Tichiwana, Your MRI of the cervical spine showed abnormalities. You have several levels of possible pinched nerves as they  leave the cervical spine and at one level you have moderate central canal stenosis(the cord itself in th emiddle). I think you should be seen by neurosurgery for evaluation. I will ask my team to call you and see if ok to send you, I'm not sure we need the EMG/NCS given the spine findings likely causing the problems in your arms. I'll ask my team to call you about it tomorrow. Happy thanksgiving. Thanks   MRI cervical spine (FYI) MRI cervical spine without contrast demonstrating: - At C3-4 disc bulging and facet hypertrophy with moderate spinal stenosis and severe bilateral foraminal stenosis; left greater than right far lateral T2 hyperintense signal within the spinal cord at C4 level may be related to edema versus myelomalacia; autoimmune, inflammatory or postinfectious etiology is not totally ruled out. - At C4-5 disc bulging facet hypertrophy with mild spinal stenosis and severe bilateral foraminal stenosis. - At C5-6 small posterior central disc protrusion, right with disc bulging, with deformation of ventral spinal cord and severe right foraminal stenosis.

## 2023-03-23 ENCOUNTER — Encounter: Payer: Self-pay | Admitting: *Deleted

## 2023-03-23 NOTE — Addendum Note (Signed)
Addended by: Bertram Savin on: 03/23/2023 03:13 PM   Modules accepted: Orders

## 2023-03-23 NOTE — Telephone Encounter (Signed)
I spoke with the patient and discussed his MRI cervical spine results as noted below by Dr. Lucia Gaskins.  The patient verbalized understanding and is agreeable to see neurosurgery but he is concerned about which providers would be in network with his insurance.  I did advise that we would refer to [facility] and I will send their address through MyChart.  He is welcome to call his insurance to confirm.  He is also in agreement to postpone the EMG for now since it appears the likely cause of symptoms was discovered on MRI.   Referral sent to neurosurgery. EMG/NCV canceled.

## 2023-03-24 ENCOUNTER — Telehealth: Payer: Self-pay | Admitting: Neurology

## 2023-03-24 NOTE — Telephone Encounter (Signed)
Referral for neurosurgery fax to [facility] to see Dr. Jake Samples. Phone: [phone], Fax: [phone]

## 2023-04-01 ENCOUNTER — Encounter: Payer: 59 | Admitting: Neurology

## 2023-04-26 NOTE — Telephone Encounter (Signed)
Contacted pt to check if have been scheduled with [facility]. Pt has been seen with Dr Jake Samples.

## 2023-05-04 ENCOUNTER — Encounter: Payer: 59 | Admitting: Internal Medicine

## 2023-05-24 ENCOUNTER — Other Ambulatory Visit: Payer: Self-pay | Admitting: Internal Medicine

## 2023-05-24 ENCOUNTER — Other Ambulatory Visit (INDEPENDENT_AMBULATORY_CARE_PROVIDER_SITE_OTHER): Payer: No Typology Code available for payment source

## 2023-05-24 ENCOUNTER — Encounter: Payer: Self-pay | Admitting: Internal Medicine

## 2023-05-24 ENCOUNTER — Ambulatory Visit (INDEPENDENT_AMBULATORY_CARE_PROVIDER_SITE_OTHER): Payer: No Typology Code available for payment source | Admitting: Internal Medicine

## 2023-05-24 VITALS — BP 118/78 | HR 94 | Temp 98.0°F | Ht 72.0 in | Wt 240.4 lb

## 2023-05-24 DIAGNOSIS — Z0001 Encounter for general adult medical examination with abnormal findings: Secondary | ICD-10-CM

## 2023-05-24 DIAGNOSIS — E1165 Type 2 diabetes mellitus with hyperglycemia: Secondary | ICD-10-CM

## 2023-05-24 DIAGNOSIS — E559 Vitamin D deficiency, unspecified: Secondary | ICD-10-CM

## 2023-05-24 DIAGNOSIS — Z1211 Encounter for screening for malignant neoplasm of colon: Secondary | ICD-10-CM | POA: Diagnosis not present

## 2023-05-24 DIAGNOSIS — Z7985 Long-term (current) use of injectable non-insulin antidiabetic drugs: Secondary | ICD-10-CM

## 2023-05-24 DIAGNOSIS — E7800 Pure hypercholesterolemia, unspecified: Secondary | ICD-10-CM

## 2023-05-24 DIAGNOSIS — Z125 Encounter for screening for malignant neoplasm of prostate: Secondary | ICD-10-CM

## 2023-05-24 DIAGNOSIS — Z23 Encounter for immunization: Secondary | ICD-10-CM | POA: Diagnosis not present

## 2023-05-24 DIAGNOSIS — E538 Deficiency of other specified B group vitamins: Secondary | ICD-10-CM

## 2023-05-24 DIAGNOSIS — R202 Paresthesia of skin: Secondary | ICD-10-CM

## 2023-05-24 DIAGNOSIS — H6123 Impacted cerumen, bilateral: Secondary | ICD-10-CM

## 2023-05-24 DIAGNOSIS — I10 Essential (primary) hypertension: Secondary | ICD-10-CM | POA: Diagnosis not present

## 2023-05-24 LAB — VITAMIN B12: Vitamin B-12: 251 pg/mL (ref 211–911)

## 2023-05-24 LAB — CBC WITH DIFFERENTIAL/PLATELET
Basophils Absolute: 0 10*3/uL (ref 0.0–0.1)
Basophils Relative: 1.3 % (ref 0.0–3.0)
Eosinophils Absolute: 0 10*3/uL (ref 0.0–0.7)
Eosinophils Relative: 1.4 % (ref 0.0–5.0)
HCT: 41.5 % (ref 39.0–52.0)
Hemoglobin: 14.1 g/dL (ref 13.0–17.0)
Lymphocytes Relative: 62.5 % — ABNORMAL HIGH (ref 12.0–46.0)
Lymphs Abs: 2 10*3/uL (ref 0.7–4.0)
MCHC: 34 g/dL (ref 30.0–36.0)
MCV: 86.4 fL (ref 78.0–100.0)
Monocytes Absolute: 0.5 10*3/uL (ref 0.1–1.0)
Monocytes Relative: 15 % — ABNORMAL HIGH (ref 3.0–12.0)
Neutro Abs: 0.6 10*3/uL — ABNORMAL LOW (ref 1.4–7.7)
Neutrophils Relative %: 19.8 % — ABNORMAL LOW (ref 43.0–77.0)
Platelets: 173 10*3/uL (ref 150.0–400.0)
RBC: 4.8 Mil/uL (ref 4.22–5.81)
RDW: 13.3 % (ref 11.5–15.5)
WBC: 3.2 10*3/uL — ABNORMAL LOW (ref 4.0–10.5)

## 2023-05-24 LAB — MICROALBUMIN / CREATININE URINE RATIO
Creatinine,U: 159.6 mg/dL
Microalb Creat Ratio: 0.4 mg/g (ref 0.0–30.0)
Microalb, Ur: 0.7 mg/dL (ref 0.0–1.9)

## 2023-05-24 LAB — URINALYSIS, ROUTINE W REFLEX MICROSCOPIC
Bilirubin Urine: NEGATIVE
Hgb urine dipstick: NEGATIVE
Ketones, ur: NEGATIVE
Leukocytes,Ua: NEGATIVE
Nitrite: NEGATIVE
Specific Gravity, Urine: 1.025 (ref 1.000–1.030)
Total Protein, Urine: NEGATIVE
Urine Glucose: 250 — AB
Urobilinogen, UA: 0.2 (ref 0.0–1.0)
pH: 6 (ref 5.0–8.0)

## 2023-05-24 LAB — BASIC METABOLIC PANEL
BUN: 8 mg/dL (ref 6–23)
CO2: 25 meq/L (ref 19–32)
Calcium: 8.5 mg/dL (ref 8.4–10.5)
Chloride: 103 meq/L (ref 96–112)
Creatinine, Ser: 0.76 mg/dL (ref 0.40–1.50)
GFR: 103.34 mL/min (ref >60.00)
Glucose, Bld: 175 mg/dL — ABNORMAL HIGH (ref 70–99)
Potassium: 3.9 meq/L (ref 3.5–5.1)
Sodium: 136 meq/L (ref 135–145)

## 2023-05-24 LAB — HEPATIC FUNCTION PANEL
ALT: 18 U/L (ref 0–53)
AST: 16 U/L (ref 0–37)
Albumin: 4.1 g/dL (ref 3.5–5.2)
Alkaline Phosphatase: 63 U/L (ref 39–117)
Bilirubin, Direct: 0.1 mg/dL (ref 0.0–0.3)
Total Bilirubin: 0.4 mg/dL (ref 0.2–1.2)
Total Protein: 6.9 g/dL (ref 6.0–8.3)

## 2023-05-24 LAB — LIPID PANEL
Cholesterol: 140 mg/dL (ref 0–200)
HDL: 33.5 mg/dL — ABNORMAL LOW (ref >39.00)
LDL Cholesterol: 90 mg/dL (ref 0–99)
NonHDL: 106.46
Total CHOL/HDL Ratio: 4
Triglycerides: 81 mg/dL (ref 0.0–149.0)
VLDL: 16.2 mg/dL (ref 0.0–40.0)

## 2023-05-24 LAB — TSH: TSH: 2.33 u[IU]/mL (ref 0.35–5.50)

## 2023-05-24 LAB — PSA: PSA: 0.6 ng/mL (ref 0.10–4.00)

## 2023-05-24 LAB — VITAMIN D 25 HYDROXY (VIT D DEFICIENCY, FRACTURES): VITD: 12.61 ng/mL — ABNORMAL LOW (ref 30.00–100.00)

## 2023-05-24 LAB — HEMOGLOBIN A1C: Hgb A1c MFr Bld: 7.7 % — ABNORMAL HIGH (ref 4.6–6.5)

## 2023-05-24 MED ORDER — TIRZEPATIDE 10 MG/0.5ML ~~LOC~~ SOAJ: 10.0000 mg | SUBCUTANEOUS | 3 refills | Status: AC

## 2023-05-24 MED ORDER — ATORVASTATIN CALCIUM 20 MG PO TABS: 20.0000 mg | ORAL_TABLET | Freq: Every day | ORAL | 3 refills | Status: AC

## 2023-05-24 NOTE — Progress Notes (Unsigned)
PRE-PROCEDURE EXAM: Bilateral TM cannot be visualized due to total occlusion/impaction of the ear canal.  PROCEDURE INDICATION: remove wax to visualize ear drum & relieve discomfort  CONSENT:  Verbal     PROCEDURE NOTE:     Bilateral  EAR:  I used warm water irrigation under direct visualization with the otoscope to free the wax bolus from the ear canal.    POST- PROCEDURE EXAM: Bilateral TMs successfully visualized and found to have no erythema     The patient tolerated the procedure well.

## 2023-05-24 NOTE — Progress Notes (Signed)
The test results show that your current treatment is OK, as the tests are stable.  Please continue the same plan.  There is no other need for change of treatment or further evaluation based on these results, at this time.  thanks

## 2023-05-24 NOTE — Patient Instructions (Signed)
You had the flu shot today  Yours ear were cleared of wax today  Ok to increase the mounjaro to 10 mg weekly  Please take all new medication as prescribed - the lipitor for cholesterol  Please continue all other medications as before, and refills have been done if requested.  Please have the pharmacy call with any other refills you may need.  Please continue your efforts at being more active, low cholesterol diet, and weight control.  You are otherwise up to date with prevention measures today.  Please keep your appointments with your specialists as you may have planned  Please make an Appointment to return in 6 months, or sooner if needed, also with Lab Appointment for testing done 3-5 days before at the FIRST FLOOR Lab (so this is for TWO appointments - please see the scheduling desk as you leave)

## 2023-05-24 NOTE — Addendum Note (Signed)
Addended by: Clearnce Sorrel on: 05/24/2023 09:48 AM   Modules accepted: Orders

## 2023-05-24 NOTE — Progress Notes (Unsigned)
Patient ID: Dylan [name], male   DOB: 12-09-1970, 53 y.o.   MRN: [id number]         Chief Complaint:: wellness exam and bilateral ear wax impactions, dm, hld, low vit d       HPI:  Dylan [name] is a 53 y.o. male here for wellness exam; declines colonsocopy, for flu shot today, declines prevnar 20  o/w up to date                        Also may need c spine surgury for left cervical neuritic pain with pain bilateral hand weakness and pain. .  Followed per neurology.  Has bilateral ear wax impactions with reduced hearing.  Pt denies chest pain, increased sob or doe, wheezing, orthopnea, PND, increased LE swelling, palpitations, dizziness or syncope.   Pt denies polydipsia, polyuria, or new focal neuro s/s.    Pt denies fever, wt loss, night sweats, loss of appetite, or other constitutional symptoms  Peak wt in the past has been over 290.   Wt Readings from Last 3 Encounters:  05/24/23 240 lb 6 oz (109 kg)  01/27/23 235 lb 12.8 oz (107 kg)  10/20/22 244 lb (110.7 kg)   BP Readings from Last 3 Encounters:  05/24/23 118/78  01/27/23 (!) 140/99  10/20/22 130/82   Immunization History  Administered Date(s) Administered   Influenza Split 12/31/2011   Influenza Whole 05/10/2009   Influenza, Seasonal, Injecte, Preservative Fre 05/24/2023   Influenza,inj,Quad PF,6+ Mos 02/07/2013, 06/20/2014, 01/15/2017, 01/03/2018, 01/20/2022   Influenza,inj,quad, With Preservative 01/25/2017   Influenza-Unspecified 07/05/2015   Pneumococcal Polysaccharide-23 04/25/2008, 01/15/2017   Td 04/27/2004   Tdap 06/20/2014   Health Maintenance Due  Topic Date Due   Colonoscopy  Never done   Pneumococcal Vaccine 85-58 Years old (2 of 2 - PCV) 01/15/2018   Zoster Vaccines- Shingrix (1 of 2) Never done      Past Medical History:  Diagnosis Date   ALLERGIC RHINITIS 11/25/2007   ASTHMA 11/25/2007   Asthma    CARPAL TUNNEL SYNDROME, LEFT, MILD 11/25/2007   DIABETES MELLITUS, TYPE II 11/25/2007   Femur  fracture, left (HCC) 04/07/2017   HYPERLIPIDEMIA 11/25/2007   Metabolic syndrome 04/09/2017   NECK PAIN 08/27/2009   Obesity    Other specified forms of hearing loss 05/10/2009   Other symptoms referable to lower leg joint 11/25/2007   SHOULDER PAIN, LEFT 08/27/2009   Unspecified hearing loss 04/25/2008   Vitamin D deficiency 04/09/2017   Past Surgical History:  Procedure Laterality Date   BACK SURGERY     FEMUR IM NAIL Left 04/08/2017   Procedure: INTRAMEDULLARY (IM) RETROGRADE FEMORAL NAILING;  Surgeon: Myrene Galas, MD;  Location: [facility] OR;  Service: Orthopedics;  Laterality: Left;   s/p back surgury  2002   Lumbar disc    reports that he has never smoked. He has never used smokeless tobacco. He reports that he does not drink alcohol and does not use drugs. family history includes Diabetes in an other family member. Allergies  Allergen Reactions   Penicillins Other (See Comments)    From childhood: Has patient had a PCN reaction causing immediate rash, facial/tongue/throat swelling, SOB or lightheadedness with hypotension: Unk Has patient had a PCN reaction causing severe rash involving mucus membranes or skin necrosis: Unk Has patient had a PCN reaction that required hospitalization: Unk Has patient had a PCN reaction occurring within the last 10 years: No If all of the  above answers are "NO", then may proceed with Cephalosporin use.    Current Outpatient Medications on File Prior to Visit  Medication Sig Dispense Refill   acetaminophen (TYLENOL) 325 MG tablet Take 650 mg by mouth every 6 (six) hours as needed for moderate pain.     cholecalciferol 5000 units TABS Take 0.4 tablets (2,000 Units total) by mouth daily. 30 tablet 2   Continuous Blood Gluc Receiver (FREESTYLE LIBRE 2 READER) DEVI Use as directed twice daily E11.9 1 each 2   Continuous Blood Gluc Sensor (FREESTYLE LIBRE 2 SENSOR) MISC Use as directed twice per day E11.9 6 each 3   gabapentin (NEURONTIN) 300 MG capsule  Take 3 capsules (900 mg total) by mouth 2 (two) times daily. 540 capsule 1   Lancets Misc. MISC Use as directed twice daily 200 each 11   No current facility-administered medications on file prior to visit.        ROS:  All others reviewed and negative.  Objective        PE:  BP 118/78   Pulse 94   Temp 98 F (36.7 C) (Temporal)   Ht 6' (1.829 m)   Wt 240 lb 6 oz (109 kg)   BMI 32.60 kg/m                 Constitutional: Pt appears in NAD               HENT: Head: NCAT.                Right Ear: External ear normal.                 Left Ear: External ear normal.                Eyes: . Pupils are equal, round, and reactive to light. Conjunctivae and EOM are normal               Nose: without d/c or deformity               Neck: Neck supple. Gross normal ROM               Cardiovascular: Normal rate and regular rhythm.                 Pulmonary/Chest: Effort normal and breath sounds without rales or wheezing.                Abd:  Soft, NT, ND, + BS, no organomegaly               Neurological: Pt is alert. At baseline orientation, motor grossly intact               Skin: Skin is warm. No rashes, no other new lesions, LE edema - none               Psychiatric: Pt behavior is normal without agitation   Micro: none  Cardiac tracings I have personally interpreted today:  none  Pertinent Radiological findings (summarize): none   Lab Results  Component Value Date   WBC 3.2 (L) 05/24/2023   HGB 14.1 05/24/2023   HCT 41.5 05/24/2023   PLT 173.0 05/24/2023   GLUCOSE 175 (H) 05/24/2023   CHOL 140 05/24/2023   TRIG 81.0 05/24/2023   HDL 33.50 (L) 05/24/2023   LDLDIRECT 68.0 10/20/2022   LDLCALC 90 05/24/2023   ALT 18 05/24/2023   AST 16 05/24/2023   NA 136  05/24/2023   K 3.9 05/24/2023   CL 103 05/24/2023   CREATININE 0.76 05/24/2023   BUN 8 05/24/2023   CO2 25 05/24/2023   TSH 2.33 05/24/2023   PSA 0.60 05/24/2023   INR 1.03 04/07/2017   HGBA1C 7.7 (H) 05/24/2023    MICROALBUR 0.7 05/24/2023   Assessment/Plan:  Dylan [name] is a 53 y.o. Black or African American [2] male with  has a past medical history of ALLERGIC RHINITIS (11/25/2007), ASTHMA (11/25/2007), Asthma, CARPAL TUNNEL SYNDROME, LEFT, MILD (11/25/2007), DIABETES MELLITUS, TYPE II (11/25/2007), Femur fracture, left (HCC) (04/07/2017), HYPERLIPIDEMIA (11/25/2007), Metabolic syndrome (04/09/2017), NECK PAIN (08/27/2009), Obesity, Other specified forms of hearing loss (05/10/2009), Other symptoms referable to lower leg joint (11/25/2007), SHOULDER PAIN, LEFT (08/27/2009), Unspecified hearing loss (04/25/2008), and Vitamin D deficiency (04/09/2017).  Encounter for well adult exam with abnormal findings Age and sex appropriate education and counseling updated with regular exercise and diet Referrals for preventative services - declines colonoscopy,  Immunizations addressed - for flu shot, declines prevnar 20 Smoking counseling  - none needed Evidence for depression or other mood disorder - none significant Most recent labs reviewed. I have personally reviewed and have noted: 1) the patient's medical and social history 2) The patient's current medications and supplements 3) The patient's height, weight, and BMI have been recorded in the chart   Cerumen impaction Resolved and cleared today,  to f/u any worsening symptoms or concerns  Diabetes (HCC) Lab Results  Component Value Date   HGBA1C 7.7 (H) 05/24/2023   Uncontrolled,, pt for increased mounjaro to 10 mg weekly   Essential hypertension BP Readings from Last 3 Encounters:  05/24/23 118/78  01/27/23 (!) 140/99  10/20/22 130/82   Stable, pt to continue medical treatment  - diet, wt control   Hyperlipidemia Lab Results  Component Value Date   LDLCALC 90 05/24/2023   Unocntrolled, pt to start statin lipitor 20 mg every day, lower chol diet,   Vitamin D deficiency Last vitamin D Lab Results  Component Value Date   VD25OH 12.61 (L)  05/24/2023   Low, to start oral replacement   Impacted cerumen, bilateral Resolved and cleared,  to f/u any worsening symptoms or concerns  Followup: Return in about 6 months (around 11/21/2023).  Oliver Barre, MD 05/27/2023 9:06 PM  [facility]

## 2023-05-27 ENCOUNTER — Encounter: Payer: Self-pay | Admitting: Internal Medicine

## 2023-05-27 NOTE — Assessment & Plan Note (Signed)
BP Readings from Last 3 Encounters:  05/24/23 118/78  01/27/23 (!) 140/99  10/20/22 130/82   Stable, pt to continue medical treatment  - diet, wt control

## 2023-05-27 NOTE — Assessment & Plan Note (Signed)
Resolved and cleared,  to f/u any worsening symptoms or concerns

## 2023-05-27 NOTE — Assessment & Plan Note (Signed)
Last vitamin D Lab Results  Component Value Date   VD25OH 12.61 (L) 05/24/2023   Low, to start oral replacement

## 2023-05-27 NOTE — Assessment & Plan Note (Signed)
Age and sex appropriate education and counseling updated with regular exercise and diet Referrals for preventative services - declines colonoscopy,  Immunizations addressed - for flu shot, declines prevnar 20 Smoking counseling  - none needed Evidence for depression or other mood disorder - none significant Most recent labs reviewed. I have personally reviewed and have noted: 1) the patient's medical and social history 2) The patient's current medications and supplements 3) The patient's height, weight, and BMI have been recorded in the chart

## 2023-05-27 NOTE — Assessment & Plan Note (Signed)
Resolved and cleared today,  to f/u any worsening symptoms or concerns

## 2023-05-27 NOTE — Assessment & Plan Note (Addendum)
Lab Results  Component Value Date   LDLCALC 90 05/24/2023   Unocntrolled, pt to start statin lipitor 20 mg every day, lower chol diet,

## 2023-05-27 NOTE — Assessment & Plan Note (Signed)
Lab Results  Component Value Date   HGBA1C 7.7 (H) 05/24/2023   Uncontrolled,, pt for increased mounjaro to 10 mg weekly

## 2023-06-23 ENCOUNTER — Encounter: Payer: Self-pay | Admitting: Internal Medicine

## 2023-06-23 LAB — COLOGUARD: COLOGUARD: NEGATIVE

## 2024-01-15 ENCOUNTER — Other Ambulatory Visit: Payer: Self-pay | Admitting: Internal Medicine

## 2024-01-17 NOTE — Telephone Encounter (Signed)
 Done erx
